# Patient Record
Sex: Male | Born: 1955 | Race: White | Hispanic: No | Marital: Married | Smoking: Never smoker
Health system: Southern US, Community
[De-identification: ages and names within clinical notes are randomized; demographics above are authoritative.]

## PROBLEM LIST (undated history)

## (undated) ENCOUNTER — Ambulatory Visit: Admission: EM | Source: Home / Self Care

## (undated) DIAGNOSIS — E0590 Thyrotoxicosis, unspecified without thyrotoxic crisis or storm: Secondary | ICD-10-CM

## (undated) DIAGNOSIS — J45909 Unspecified asthma, uncomplicated: Secondary | ICD-10-CM

## (undated) DIAGNOSIS — E785 Hyperlipidemia, unspecified: Secondary | ICD-10-CM

## (undated) DIAGNOSIS — C801 Malignant (primary) neoplasm, unspecified: Secondary | ICD-10-CM

## (undated) DIAGNOSIS — I10 Essential (primary) hypertension: Secondary | ICD-10-CM

## (undated) DIAGNOSIS — M1990 Unspecified osteoarthritis, unspecified site: Secondary | ICD-10-CM

## (undated) DIAGNOSIS — J3089 Other allergic rhinitis: Secondary | ICD-10-CM

## (undated) DIAGNOSIS — F329 Major depressive disorder, single episode, unspecified: Secondary | ICD-10-CM

## (undated) DIAGNOSIS — E079 Disorder of thyroid, unspecified: Secondary | ICD-10-CM

## (undated) DIAGNOSIS — T7840XA Allergy, unspecified, initial encounter: Secondary | ICD-10-CM

## (undated) DIAGNOSIS — F32A Depression, unspecified: Secondary | ICD-10-CM

## (undated) HISTORY — DX: Major depressive disorder, single episode, unspecified: F32.9

## (undated) HISTORY — DX: Allergy, unspecified, initial encounter: T78.40XA

## (undated) HISTORY — DX: Hyperlipidemia, unspecified: E78.5

## (undated) HISTORY — DX: Unspecified asthma, uncomplicated: J45.909

## (undated) HISTORY — DX: Essential (primary) hypertension: I10

## (undated) HISTORY — DX: Depression, unspecified: F32.A

## (undated) HISTORY — DX: Other allergic rhinitis: J30.89

## (undated) HISTORY — DX: Disorder of thyroid, unspecified: E07.9

---

## 2003-05-03 ENCOUNTER — Other Ambulatory Visit: Payer: Self-pay

## 2003-06-02 HISTORY — PX: NASAL SINUS SURGERY: SHX719

## 2005-06-09 ENCOUNTER — Emergency Department: Payer: Self-pay | Admitting: Unknown Physician Specialty

## 2005-06-22 ENCOUNTER — Emergency Department: Payer: Self-pay | Admitting: Emergency Medicine

## 2014-04-18 LAB — PSA

## 2014-11-29 ENCOUNTER — Other Ambulatory Visit: Payer: Self-pay

## 2014-11-30 MED ORDER — LISINOPRIL 10 MG PO TABS: 10.0000 mg | ORAL_TABLET | Freq: Every day | ORAL | Status: DC

## 2014-11-30 MED ORDER — AMLODIPINE BESYLATE 5 MG PO TABS: 5.0000 mg | ORAL_TABLET | Freq: Every day | ORAL | Status: DC

## 2014-11-30 MED ORDER — PAROXETINE HCL 20 MG PO TABS: 20.0000 mg | ORAL_TABLET | Freq: Every day | ORAL | Status: DC

## 2014-12-04 ENCOUNTER — Telehealth: Payer: Self-pay

## 2014-12-04 MED ORDER — AMLODIPINE BESYLATE 5 MG PO TABS: ORAL_TABLET | ORAL | Status: DC

## 2014-12-04 NOTE — Telephone Encounter (Signed)
Rx sent to his pharmacy. Needs to be seen for more refills.

## 2014-12-04 NOTE — Telephone Encounter (Signed)
Pharmacy sent a fax over stating that 2 sets of directions were placed on the Amlodipine. [facility] states take 2 tabs daily and [facility] number is [phone]. Can you re write the rx with the correct directions?

## 2015-01-25 ENCOUNTER — Other Ambulatory Visit: Payer: Self-pay | Admitting: Unknown Physician Specialty

## 2015-01-25 NOTE — Telephone Encounter (Signed)
Needs check soon please

## 2015-04-01 ENCOUNTER — Telehealth: Payer: Self-pay | Admitting: Unknown Physician Specialty

## 2015-04-01 NOTE — Telephone Encounter (Signed)
Pt would like to know if he can pick up a steroid inhaler sample until he gets his through mail order(advair is what he normally uses)

## 2015-04-01 NOTE — Telephone Encounter (Signed)
Routing to provider. Patient uses the Advair 100/50 which is what we have in the sample closet. Is it okay for him to have a sample?

## 2015-04-01 NOTE — Telephone Encounter (Signed)
Yes, thanks

## 2015-04-01 NOTE — Telephone Encounter (Signed)
Left patient a voicemail stating that he could come by between 8 and 5 to pick up his sample.

## 2015-04-16 DIAGNOSIS — I10 Essential (primary) hypertension: Secondary | ICD-10-CM | POA: Insufficient documentation

## 2015-04-16 DIAGNOSIS — F325 Major depressive disorder, single episode, in full remission: Secondary | ICD-10-CM | POA: Insufficient documentation

## 2015-04-16 DIAGNOSIS — F3341 Major depressive disorder, recurrent, in partial remission: Secondary | ICD-10-CM

## 2015-04-16 DIAGNOSIS — J45909 Unspecified asthma, uncomplicated: Secondary | ICD-10-CM | POA: Insufficient documentation

## 2015-04-16 DIAGNOSIS — E785 Hyperlipidemia, unspecified: Secondary | ICD-10-CM

## 2015-04-16 DIAGNOSIS — J309 Allergic rhinitis, unspecified: Secondary | ICD-10-CM | POA: Insufficient documentation

## 2015-04-25 ENCOUNTER — Other Ambulatory Visit: Payer: Self-pay | Admitting: Unknown Physician Specialty

## 2015-04-29 ENCOUNTER — Ambulatory Visit (INDEPENDENT_AMBULATORY_CARE_PROVIDER_SITE_OTHER): Payer: BLUE CROSS/BLUE SHIELD | Admitting: Unknown Physician Specialty

## 2015-04-29 ENCOUNTER — Encounter: Payer: Self-pay | Admitting: Unknown Physician Specialty

## 2015-04-29 VITALS — BP 133/85 | HR 63 | Temp 97.6°F | Ht 72.5 in | Wt 188.0 lb

## 2015-04-29 DIAGNOSIS — R5383 Other fatigue: Secondary | ICD-10-CM

## 2015-04-29 DIAGNOSIS — J45909 Unspecified asthma, uncomplicated: Secondary | ICD-10-CM | POA: Diagnosis not present

## 2015-04-29 DIAGNOSIS — I10 Essential (primary) hypertension: Secondary | ICD-10-CM | POA: Diagnosis not present

## 2015-04-29 DIAGNOSIS — Z0000 Encounter for general adult medical examination without abnormal findings: Secondary | ICD-10-CM | POA: Diagnosis not present

## 2015-04-29 DIAGNOSIS — F3341 Major depressive disorder, recurrent, in partial remission: Secondary | ICD-10-CM

## 2015-04-29 MED ORDER — FLUTICASONE-SALMETEROL 100-50 MCG/DOSE IN AEPB: 1.0000 | INHALATION_SPRAY | Freq: Two times a day (BID) | RESPIRATORY_TRACT | Status: DC

## 2015-04-29 MED ORDER — LISINOPRIL 10 MG PO TABS: 10.0000 mg | ORAL_TABLET | Freq: Every day | ORAL | Status: DC

## 2015-04-29 MED ORDER — FLUTICASONE PROPIONATE 50 MCG/ACT NA SUSP: 2.0000 | Freq: Every day | NASAL | Status: DC

## 2015-04-29 MED ORDER — PAROXETINE HCL 20 MG PO TABS: 20.0000 mg | ORAL_TABLET | Freq: Every day | ORAL | Status: DC

## 2015-04-29 MED ORDER — AMLODIPINE BESYLATE 5 MG PO TABS: 10.0000 mg | ORAL_TABLET | Freq: Every day | ORAL | Status: DC

## 2015-04-29 NOTE — Assessment & Plan Note (Signed)
Stable, continue present medications.   

## 2015-04-29 NOTE — Assessment & Plan Note (Signed)
Stable.  Question if fatigue is related to worsening depression

## 2015-04-29 NOTE — Progress Notes (Signed)
++  BP 133/85 mmHg  Pulse 63  Temp(Src) 97.6 F (36.4 C)  Ht 6' 0.5" (1.842 m)  Wt 188 lb (85.276 kg)  BMI 25.13 kg/m2  SpO2 95%   Subjective:    Patient ID: Jonathan Roberson, male    DOB: 07-19-1955, 59 y.o.   MRN: [id number]  HPI: Jonathan Roberson is a 59 y.o. male  Chief Complaint  Patient presents with  . Annual Exam    pt states he wants to talk about getting shingles vaccine and sleeping to much   Sleepyness  Pt states that he is often sleepy.  He wonders if his depression is well controlled.  He states he only snores from time to time but he is not sure.  He does feel rested in the AM.  He does exercise during the day which helps.   Depression screen PHQ 2/9 04/29/2015  Decreased Interest 2  Down, Depressed, Hopeless 1  PHQ - 2 Score 3  Altered sleeping 2  Tired, decreased energy 2  Change in appetite 0  Feeling bad or failure about yourself  0  Trouble concentrating 1  Moving slowly or fidgety/restless 1  Suicidal thoughts 0  PHQ-9 Score 9   Hypertension Using medications without difficulty Average home BPs   No problems or lightheadedness No chest pain with exertion or shortness of breath No Edema  Relevant past medical, surgical, family and social history reviewed and updated as indicated. Interim medical history since our last visit reviewed. Allergies and medications reviewed and updated.  Review of Systems  Constitutional: Negative.   HENT: Negative.   Eyes: Negative.   Respiratory: Negative.   Cardiovascular: Negative.   Gastrointestinal: Negative.   Endocrine: Negative.   Genitourinary: Negative.   Skin: Negative.   Allergic/Immunologic: Negative.   Neurological: Negative.   Hematological: Negative.   Psychiatric/Behavioral: Negative.     Per HPI unless specifically indicated above     Objective:    BP 133/85 mmHg  Pulse 63  Temp(Src) 97.6 F (36.4 C)  Ht 6' 0.5" (1.842 m)  Wt 188 lb (85.276 kg)  BMI 25.13 kg/m2  SpO2 95%  Wt  Readings from Last 3 Encounters:  04/29/15 188 lb (85.276 kg)  04/18/14 192 lb (87.091 kg)    Physical Exam  Constitutional: He is oriented to person, place, and time. He appears well-developed and well-nourished.  HENT:  Head: Normocephalic.  Eyes: Pupils are equal, round, and reactive to light.  Cardiovascular: Normal rate, regular rhythm and normal heart sounds.   Pulmonary/Chest: Effort normal.  Abdominal: Soft. Bowel sounds are normal.  Musculoskeletal: Normal range of motion.  Neurological: He is alert and oriented to person, place, and time. He has normal reflexes.  Skin: Skin is warm and dry.  Psychiatric: He has a normal mood and affect. His behavior is normal. Judgment and thought content normal.    Assessment & Plan:   Problem List Items Addressed This Visit      Unprioritized   Hypertension - Primary    Stable, continue present medications.       Relevant Medications   amLODipine (NORVASC) 5 MG tablet   lisinopril (PRINIVIL,ZESTRIL) 10 MG tablet   Other Relevant Orders   Comprehensive metabolic panel   Lipid Panel w/o Chol/HDL Ratio   Asthma    Stable, continue present medications.       Relevant Medications   Fluticasone-Salmeterol (ADVAIR) 100-50 MCG/DOSE AEPB   Major depression (HCC)    Stable.  Question if  fatigue is related to worsening depression      Relevant Medications   PARoxetine (PAXIL) 20 MG tablet    Other Visit Diagnoses    Annual physical exam        Relevant Orders    CBC    Hepatitis C antibody    HIV antibody    Other fatigue        Relevant Orders    CBC    TSH    VITAMIN D 25 Hydroxy (Vit-D Deficiency, Fractures)        Follow up plan: Return in about 6 months (around 10/27/2015).

## 2015-04-30 LAB — COMPREHENSIVE METABOLIC PANEL
ALBUMIN: 4.5 g/dL (ref 3.5–5.5)
ALT: 22 IU/L (ref 0–44)
AST: 27 IU/L (ref 0–40)
Albumin/Globulin Ratio: 1.6 (ref 1.1–2.5)
Alkaline Phosphatase: 56 IU/L (ref 39–117)
BUN / CREAT RATIO: 14 (ref 9–20)
BUN: 11 mg/dL (ref 6–24)
Bilirubin Total: 1 mg/dL (ref 0.0–1.2)
CALCIUM: 9.1 mg/dL (ref 8.7–10.2)
CO2: 27 mmol/L (ref 18–29)
CREATININE: 0.8 mg/dL (ref 0.76–1.27)
Chloride: 94 mmol/L — ABNORMAL LOW (ref 97–106)
GFR calc non Af Amer: 98 mL/min/{1.73_m2} (ref >59)
GFR, EST AFRICAN AMERICAN: 114 mL/min/{1.73_m2} (ref >59)
GLUCOSE: 81 mg/dL (ref 65–99)
Globulin, Total: 2.8 g/dL (ref 1.5–4.5)
Potassium: 3.7 mmol/L (ref 3.5–5.2)
Sodium: 135 mmol/L — ABNORMAL LOW (ref 136–144)
TOTAL PROTEIN: 7.3 g/dL (ref 6.0–8.5)

## 2015-04-30 LAB — CBC
HEMOGLOBIN: 14.2 g/dL (ref 12.6–17.7)
Hematocrit: 41.4 % (ref 37.5–51.0)
MCH: 30.9 pg (ref 26.6–33.0)
MCHC: 34.3 g/dL (ref 31.5–35.7)
MCV: 90 fL (ref 79–97)
Platelets: 192 10*3/uL (ref 150–379)
RBC: 4.6 x10E6/uL (ref 4.14–5.80)
RDW: 13.7 % (ref 12.3–15.4)
WBC: 4.8 10*3/uL (ref 3.4–10.8)

## 2015-04-30 LAB — LIPID PANEL W/O CHOL/HDL RATIO
CHOLESTEROL TOTAL: 220 mg/dL — AB (ref 100–199)
HDL: 87 mg/dL (ref >39)
LDL Calculated: 120 mg/dL — ABNORMAL HIGH (ref 0–99)
Triglycerides: 66 mg/dL (ref 0–149)
VLDL Cholesterol Cal: 13 mg/dL (ref 5–40)

## 2015-04-30 LAB — HIV ANTIBODY (ROUTINE TESTING W REFLEX): HIV Screen 4th Generation wRfx: NONREACTIVE

## 2015-04-30 LAB — HEPATITIS C ANTIBODY: Hep C Virus Ab: <0.1 s/co ratio (ref 0.0–0.9)

## 2015-04-30 LAB — TSH: TSH: 1.02 u[IU]/mL (ref 0.450–4.500)

## 2015-04-30 LAB — VITAMIN D 25 HYDROXY (VIT D DEFICIENCY, FRACTURES): Vit D, 25-Hydroxy: 29.8 ng/mL — ABNORMAL LOW (ref 30.0–100.0)

## 2015-05-01 ENCOUNTER — Encounter: Payer: Self-pay | Admitting: Unknown Physician Specialty

## 2015-05-01 LAB — SPECIMEN STATUS REPORT

## 2015-05-01 LAB — PSA: PROSTATE SPECIFIC AG, SERUM: 2.1 ng/mL (ref 0.0–4.0)

## 2015-07-12 ENCOUNTER — Telehealth: Payer: Self-pay

## 2015-07-12 ENCOUNTER — Telehealth: Payer: Self-pay | Admitting: Unknown Physician Specialty

## 2015-07-12 MED ORDER — AMLODIPINE BESYLATE 5 MG PO TABS: 10.0000 mg | ORAL_TABLET | Freq: Every day | ORAL | Status: DC

## 2015-07-12 NOTE — Telephone Encounter (Signed)
Called pharmacy and they said they did not get the prescription that was sent on 04/29/15. They said they got one on 04/26/15 for 90 days that was sent out on 04/30/15. They state they need a new prescription.

## 2015-07-12 NOTE — Telephone Encounter (Signed)
Patient called to advise that Vitamin D testing was not covered by his insurance and that he has received a bill from [employer].  Per Kathrine Haddock, FNP added additional diagnosis code E55.9.  Contacted [facility] and claim is being resubmitted with additional diagnosis.

## 2015-07-12 NOTE — Telephone Encounter (Signed)
LMOM for Jonathan Roberson advising that an additional diagnosis code was submitted to [facility] and claim is being resubmitted to the insurance company for [place] 04/29/15.

## 2015-07-12 NOTE — Telephone Encounter (Signed)
Pt called stated [facility] did not receive the RX for his Amlodipine. Can this be resubmitted. Thanks.

## 2015-09-05 DIAGNOSIS — J301 Allergic rhinitis due to pollen: Secondary | ICD-10-CM | POA: Diagnosis not present

## 2015-09-05 DIAGNOSIS — J330 Polyp of nasal cavity: Secondary | ICD-10-CM | POA: Diagnosis not present

## 2015-09-05 DIAGNOSIS — J329 Chronic sinusitis, unspecified: Secondary | ICD-10-CM | POA: Diagnosis not present

## 2015-09-10 ENCOUNTER — Other Ambulatory Visit: Payer: Self-pay | Admitting: Unknown Physician Specialty

## 2015-09-17 ENCOUNTER — Encounter: Payer: Self-pay | Admitting: *Deleted

## 2015-09-23 NOTE — Discharge Instructions (Signed)
[facility] °[facility] °ENDOSCOPIC SINUS SURGERY °[facility] ° °What is Functional Endoscopic Sinus Surgery? ° The Surgery involves making the natural openings of the sinuses larger by removing the bony partitions that separate the sinuses from the nasal cavity.  The natural sinus lining is preserved as much as possible to allow the sinuses to resume normal function after the surgery.  In some patients nasal polyps (excessively swollen lining of the sinuses) may be removed to relieve obstruction of the sinus openings.  The surgery is performed through the nose using lighted scopes, which eliminates the need for incisions on the face.  A septoplasty is a different procedure which is sometimes performed with sinus surgery.  It involves straightening the boy partition that separates the two sides of your nose.  A crooked or deviated septum may need repair if is obstructing the sinuses or nasal airflow.  Turbinate reduction is also often performed during sinus surgery.  The turbinates are bony proturberances from the side walls of the nose which swell and can obstruct the nose in patients with sinus and allergy problems.  Their size can be surgically reduced to help relieve nasal obstruction. ° °What Can Sinus Surgery Do For Me? ° Sinus surgery can reduce the frequency of sinus infections requiring antibiotic treatment.  This can provide improvement in nasal congestion, post-nasal drainage, facial pressure and nasal obstruction.  Surgery will NOT prevent you from ever having an infection again, so it usually only for patients who get infections 4 or more times yearly requiring antibiotics, or for infections that do not clear with antibiotics.  It will not cure nasal allergies, so patients with allergies may still require medication to treat their allergies after surgery. Surgery may improve headaches related to sinusitis, however, some people will continue to  require medication to control sinus headaches related to allergies.  Surgery will do nothing for other forms of headache (migraine, tension or cluster). ° °What Are the Risks of Endoscopic Sinus Surgery? ° Current techniques allow surgery to be performed safely with little risk, however, there are rare complications that patients should be aware of.  Because the sinuses are located around the eyes, there is risk of eye injury, including blindness, though again, this would be quite rare. This is usually a result of bleeding behind the eye during surgery, which puts the vision oat risk, though there are treatments to protect the vision and prevent permanent disrupted by surgery causing a leak of the spinal fluid that surrounds the brain.  More serious complications would include bleeding inside the brain cavity or damage to the brain.  Again, all of these complications are uncommon, and spinal fluid leaks can be safely managed surgically if they occur.  The most common complication of sinus surgery is bleeding from the nose, which may require packing or cauterization of the nose.  Continued sinus have polyps may experience recurrence of the polyps requiring revision surgery.  Alterations of sense of smell or injury to the tear ducts are also rare complications.  ° °What is the Surgery Like, and what is the Recovery? ° The Surgery usually takes a couple of hours to perform, and is usually performed under a general anesthetic (completely asleep).  Patients are usually discharged home after a couple of hours.  Sometimes during surgery it is necessary to pack the nose to control bleeding, and the packing is left in place for 24 - 48 hours, and removed by your surgeon.    If a septoplasty was performed during the procedure, there is often a splint placed which must be removed after 5-7 days.   °Discomfort: Pain is usually mild to moderate, and can be controlled by prescription pain medication or acetaminophen (Tylenol).   Aspirin, Ibuprofen (Advil, Motrin), or Naprosyn (Aleve) should be avoided, as they can cause increased bleeding.  Most patients feel sinus pressure like they have a bad head cold for several days.  Sleeping with your head elevated can help reduce swelling and facial pressure, as can ice packs over the face.  A humidifier may be helpful to keep the mucous and blood from drying in the nose.  ° °Diet: There are no specific diet restrictions, however, you should generally start with clear liquids and a light diet of bland foods because the anesthetic can cause some nausea.  Advance your diet depending on how your stomach feels.  Taking your pain medication with food will often help reduce stomach upset which pain medications can cause. ° °Nasal Saline Irrigation: It is important to remove blood clots and dried mucous from the nose as it is healing.  This is done by having you irrigate the nose at least 3 - 4 times daily with a salt water solution.  We recommend using NeilMed Sinus Rinse (available at the drug store).  Fill the squeeze bottle with the solution, bend over a sink, and insert the tip of the squeeze bottle into the nose ½ of an inch.  Point the tip of the squeeze bottle towards the inside corner of the eye on the same side your irrigating.  Squeeze the bottle and gently irrigate the nose.  If you bend forward as you do this, most of the fluid will flow back out of the nose, instead of down your throat.   The solution should be warm, near body temperature, when you irrigate.   Each time you irrigate, you should use a full squeeze bottle.  ° °Note that if you are instructed to use Nasal Steroid Sprays at any time after your surgery, irrigate with saline BEFORE using the steroid spray, so you do not wash it all out of the nose. °Another product, Nasal Saline Gel (such as AYR Nasal Saline Gel) can be applied in each nostril 3 - 4 times daily to moisture the nose and reduce scabbing or crusting. ° °Bleeding:   Bloody drainage from the nose can be expected for several days, and patients are instructed to irrigate their nose frequently with salt water to help remove mucous and blood clots.  The drainage may be dark red or brown, though some fresh blood may be seen intermittently, especially after irrigation.  Do not blow you nose, as bleeding may occur. If you must sneeze, keep your mouth open to allow air to escape through your mouth. ° °If heavy bleeding occurs: Irrigate the nose with saline to rinse out clots, then spray the nose 3 - 4 times with Afrin Nasal Decongestant Spray.  The spray will constrict the blood vessels to slow bleeding.  Pinch the lower half of your nose shut to apply pressure, and lay down with your head elevated.  Ice packs over the nose may help as well. If bleeding persists despite these measures, you should notify your doctor.  Do not use the Afrin routinely to control nasal congestion after surgery, as it can result in worsening congestion and may affect healing.  ° ° ° °Activity: Return to work varies among patients. Most patients will be   out of work at least 5 - 7 days to recover.  Patient may return to work after they are off of narcotic pain medication, and feeling well enough to perform the functions of their job.  Patients must avoid heavy lifting (over 10 pounds) or strenuous physical for 2 weeks after surgery, so your employer may need to assign you to light duty, or keep you out of work longer if light duty is not possible.  NOTE: you should not drive, operate dangerous machinery, do any mentally demanding tasks or make any important legal or financial decisions while on narcotic pain medication and recovering from the general anesthetic.  °  °Call Your Doctor Immediately if You Have Any of the Following: °1. Bleeding that you cannot control with the above measures °2. Loss of vision, double vision, bulging of the eye or black eyes. °3. Fever over 101 degrees °4. Neck stiffness with  severe headache, fever, nausea and change in mental state. °You are always encourage to call anytime with concerns, however, please call with requests for pain medication refills during office hours. ° °Office Endoscopy: During follow-up visits your doctor will remove any packing or splints that may have been placed and evaluate and clean your sinuses endoscopically.  Topical anesthetic will be used to make this as comfortable as possible, though you may want to take your pain medication prior to the visit.  How often this will need to be done varies from patient to patient.  After complete recovery from the surgery, you may need follow-up endoscopy from time to time, particularly if there is concern of recurrent infection or nasal polyps. ° °General Anesthesia, Adult, Care After °Refer to this sheet in the next few weeks. These instructions provide you with information on caring for yourself after your procedure. Your health care provider may also give you more specific instructions. Your treatment has been planned according to current medical practices, but problems sometimes occur. Call your health care provider if you have any problems or questions after your procedure. °WHAT TO EXPECT AFTER THE PROCEDURE °After the procedure, it is typical to experience: °· Sleepiness. °· Nausea and vomiting. °HOME CARE INSTRUCTIONS °· For the first 24 hours after general anesthesia: °¨ Have a responsible person with you. °¨ Do not drive a car. If you are alone, do not take public transportation. °¨ Do not drink alcohol. °¨ Do not take medicine that has not been prescribed by your health care provider. °¨ Do not sign important papers or make important decisions. °¨ You may resume a normal diet and activities as directed by your health care provider. °· Change bandages (dressings) as directed. °· If you have questions or problems that seem related to general anesthesia, call the hospital and ask for the anesthetist or  anesthesiologist on call. °SEEK MEDICAL CARE IF: °· You have nausea and vomiting that continue the day after anesthesia. °· You develop a rash. °SEEK IMMEDIATE MEDICAL CARE IF:  °· You have difficulty breathing. °· You have chest pain. °· You have any allergic problems. °  °This information is not intended to replace advice given to you by your health care provider. Make sure you discuss any questions you have with your health care provider. °  °Document Released: 08/24/2000 Document Revised: 06/08/2014 Document Reviewed: 09/16/2011 °Elsevier Interactive Patient Education ©2016 [employer] Inc[employer] ° °

## 2015-09-24 ENCOUNTER — Encounter
Admission: RE | Disposition: A | Discharge status: Home / Self Care | Payer: Self-pay | Source: Ambulatory Visit | Attending: Otolaryngology

## 2015-09-24 ENCOUNTER — Ambulatory Visit
Admission: RE | Admit: 2015-09-24 | Discharge: 2015-09-24 | Disposition: A | Discharge status: Home / Self Care | Payer: BLUE CROSS/BLUE SHIELD | Source: Ambulatory Visit | Attending: Otolaryngology | Admitting: Otolaryngology

## 2015-09-24 ENCOUNTER — Ambulatory Visit: Payer: BLUE CROSS/BLUE SHIELD | Admitting: Anesthesiology

## 2015-09-24 DIAGNOSIS — Z7982 Long term (current) use of aspirin: Secondary | ICD-10-CM | POA: Diagnosis not present

## 2015-09-24 DIAGNOSIS — J018 Other acute sinusitis: Secondary | ICD-10-CM | POA: Diagnosis not present

## 2015-09-24 DIAGNOSIS — Z881 Allergy status to other antibiotic agents status: Secondary | ICD-10-CM | POA: Insufficient documentation

## 2015-09-24 DIAGNOSIS — J321 Chronic frontal sinusitis: Secondary | ICD-10-CM | POA: Insufficient documentation

## 2015-09-24 DIAGNOSIS — Z825 Family history of asthma and other chronic lower respiratory diseases: Secondary | ICD-10-CM | POA: Diagnosis not present

## 2015-09-24 DIAGNOSIS — J320 Chronic maxillary sinusitis: Secondary | ICD-10-CM | POA: Insufficient documentation

## 2015-09-24 DIAGNOSIS — F329 Major depressive disorder, single episode, unspecified: Secondary | ICD-10-CM | POA: Insufficient documentation

## 2015-09-24 DIAGNOSIS — J322 Chronic ethmoidal sinusitis: Secondary | ICD-10-CM | POA: Diagnosis not present

## 2015-09-24 DIAGNOSIS — Z7951 Long term (current) use of inhaled steroids: Secondary | ICD-10-CM | POA: Diagnosis not present

## 2015-09-24 DIAGNOSIS — I10 Essential (primary) hypertension: Secondary | ICD-10-CM | POA: Diagnosis not present

## 2015-09-24 DIAGNOSIS — J329 Chronic sinusitis, unspecified: Secondary | ICD-10-CM | POA: Diagnosis not present

## 2015-09-24 DIAGNOSIS — J339 Nasal polyp, unspecified: Secondary | ICD-10-CM | POA: Diagnosis not present

## 2015-09-24 DIAGNOSIS — J45909 Unspecified asthma, uncomplicated: Secondary | ICD-10-CM | POA: Insufficient documentation

## 2015-09-24 DIAGNOSIS — Z880 Allergy status to penicillin: Secondary | ICD-10-CM | POA: Insufficient documentation

## 2015-09-24 DIAGNOSIS — J330 Polyp of nasal cavity: Secondary | ICD-10-CM | POA: Diagnosis not present

## 2015-09-24 DIAGNOSIS — J323 Chronic sphenoidal sinusitis: Secondary | ICD-10-CM | POA: Insufficient documentation

## 2015-09-24 DIAGNOSIS — J328 Other chronic sinusitis: Secondary | ICD-10-CM | POA: Diagnosis not present

## 2015-09-24 DIAGNOSIS — J324 Chronic pansinusitis: Secondary | ICD-10-CM | POA: Diagnosis not present

## 2015-09-24 DIAGNOSIS — Z79899 Other long term (current) drug therapy: Secondary | ICD-10-CM | POA: Insufficient documentation

## 2015-09-24 HISTORY — DX: Unspecified osteoarthritis, unspecified site: M19.90

## 2015-09-24 HISTORY — PX: IMAGE GUIDED SINUS SURGERY: SHX6570

## 2015-09-24 SURGERY — SINUS SURGERY, WITH IMAGING GUIDANCE
Anesthesia: General | Site: Nose | Laterality: Left | Wound class: Clean Contaminated

## 2015-09-24 MED ORDER — ONDANSETRON HCL 4 MG/2ML IJ SOLN
INTRAMUSCULAR | Status: DC | PRN
  Administered 2015-09-24: 4 mg via INTRAVENOUS

## 2015-09-24 MED ORDER — ACETAMINOPHEN 10 MG/ML IV SOLN
1000.0000 mg | Freq: Once | INTRAVENOUS | Status: AC
  Administered 2015-09-24: 1000 mg via INTRAVENOUS

## 2015-09-24 MED ORDER — PROPOFOL 10 MG/ML IV BOLUS
INTRAVENOUS | Status: DC | PRN
  Administered 2015-09-24: 50 mg via INTRAVENOUS
  Administered 2015-09-24: 150 mg via INTRAVENOUS

## 2015-09-24 MED ORDER — LACTATED RINGERS IV SOLN
INTRAVENOUS | Status: DC
  Administered 2015-09-24: 10:00:00 via INTRAVENOUS

## 2015-09-24 MED ORDER — SUCCINYLCHOLINE CHLORIDE 20 MG/ML IJ SOLN
INTRAMUSCULAR | Status: DC | PRN
  Administered 2015-09-24: 100 mg via INTRAVENOUS

## 2015-09-24 MED ORDER — PREDNISONE 10 MG (21) PO TBPK: ORAL_TABLET | ORAL | Status: DC

## 2015-09-24 MED ORDER — ROCURONIUM BROMIDE 100 MG/10ML IV SOLN
INTRAVENOUS | Status: DC | PRN
  Administered 2015-09-24: 30 mg via INTRAVENOUS

## 2015-09-24 MED ORDER — LIDOCAINE-EPINEPHRINE 1 %-1:100000 IJ SOLN
INTRAMUSCULAR | Status: DC | PRN
  Administered 2015-09-24: 13 mL

## 2015-09-24 MED ORDER — OXYCODONE HCL 5 MG PO TABS: 5.0000 mg | ORAL_TABLET | Freq: Once | ORAL | Status: DC | PRN

## 2015-09-24 MED ORDER — DOXYCYCLINE HYCLATE 100 MG PO CAPS: 100.0000 mg | ORAL_CAPSULE | Freq: Two times a day (BID) | ORAL | Status: DC

## 2015-09-24 MED ORDER — MIDAZOLAM HCL 5 MG/5ML IJ SOLN
INTRAMUSCULAR | Status: DC | PRN
  Administered 2015-09-24: 2 mg via INTRAVENOUS

## 2015-09-24 MED ORDER — LIDOCAINE HCL (CARDIAC) 20 MG/ML IV SOLN
INTRAVENOUS | Status: DC | PRN
  Administered 2015-09-24: 50 mg via INTRAVENOUS

## 2015-09-24 MED ORDER — OXYMETAZOLINE HCL 0.05 % NA SOLN
NASAL | Status: DC | PRN
  Administered 2015-09-24: 1 via TOPICAL

## 2015-09-24 MED ORDER — HYDROCODONE-ACETAMINOPHEN 5-325 MG PO TABS: 1.0000 | ORAL_TABLET | ORAL | Status: DC | PRN

## 2015-09-24 MED ORDER — FENTANYL CITRATE (PF) 100 MCG/2ML IJ SOLN
INTRAMUSCULAR | Status: DC | PRN
  Administered 2015-09-24: 50 ug via INTRAVENOUS
  Administered 2015-09-24: 100 ug via INTRAVENOUS
  Administered 2015-09-24: 50 ug via INTRAVENOUS

## 2015-09-24 MED ORDER — OXYCODONE HCL 5 MG/5ML PO SOLN: 5.0000 mg | Freq: Once | ORAL | Status: DC | PRN

## 2015-09-24 MED ORDER — DEXAMETHASONE SODIUM PHOSPHATE 4 MG/ML IJ SOLN
INTRAMUSCULAR | Status: DC | PRN
  Administered 2015-09-24: 12 mg via INTRAVENOUS

## 2015-09-24 SURGICAL SUPPLY — 26 items
BALLOON SINUPLASTY SYSTEM (BALLOONS) IMPLANT
BATTERY INSTRU NAVIGATION (MISCELLANEOUS) ×16 IMPLANT
BLADE IRRIGATOR 40D CVD (IRRIGATION / IRRIGATOR) IMPLANT
CANISTER SUCT 1200ML W/VALVE (MISCELLANEOUS) ×4 IMPLANT
COAG SUCT 10F 3.5MM HAND CTRL (MISCELLANEOUS) IMPLANT
DEVICE INFLATION SEID (MISCELLANEOUS) IMPLANT
DRAPE HEAD BAR (DRAPES) ×4 IMPLANT
DRESSING NASL FOAM PST OP SINU (MISCELLANEOUS) IMPLANT
DRSG NASAL 4CM NASOPORE (MISCELLANEOUS) IMPLANT
DRSG NASAL FOAM POST OP SINU (MISCELLANEOUS)
GLOVE BIO SURGEON STRL SZ7.5 (GLOVE) ×8 IMPLANT
IRRIGATOR 4MM STR (IRRIGATION / IRRIGATOR) IMPLANT
IV NS 500ML (IV SOLUTION) ×1
IV NS 500ML BAXH (IV SOLUTION) ×3 IMPLANT
KIT ROOM TURNOVER OR (KITS) ×4 IMPLANT
NAVIGATION MASK REG  ST (MISCELLANEOUS) ×4 IMPLANT
NS IRRIG 500ML POUR BTL (IV SOLUTION) ×4 IMPLANT
PACK DRAPE NASAL/ENT (PACKS) ×4 IMPLANT
PACKING NASAL EPIS 4X2.4 XEROG (MISCELLANEOUS) IMPLANT
PAD GROUND ADULT SPLIT (MISCELLANEOUS) ×4 IMPLANT
PATTIES SURGICAL .5 X3 (DISPOSABLE) ×4 IMPLANT
SET HANDPIECE IRR DIEGO (MISCELLANEOUS) IMPLANT
SOL ANTI-FOG 6CC FOG-OUT (MISCELLANEOUS) ×3 IMPLANT
SOL FOG-OUT ANTI-FOG 6CC (MISCELLANEOUS) ×1
SYRINGE 10CC LL (SYRINGE) ×8 IMPLANT
WATER STERILE IRR 500ML POUR (IV SOLUTION) IMPLANT

## 2015-09-24 NOTE — Anesthesia Procedure Notes (Signed)
Procedure Name: Intubation Date/Time: 09/24/2015 11:47 AM Performed by: Londell Moh Pre-anesthesia Checklist: Patient identified, Emergency Drugs available, Suction available, Patient being monitored and Timeout performed Patient Re-evaluated:Patient Re-evaluated prior to inductionOxygen Delivery Method: Circle system utilized Preoxygenation: Pre-oxygenation with 100% oxygen Intubation Type: IV induction Ventilation: Mask ventilation without difficulty Laryngoscope Size: Mac and 4 Grade View: Grade I Tube type: Oral Rae Tube size: 8.0 mm Number of attempts: 1 Placement Confirmation: ETT inserted through vocal cords under direct vision,  positive ETCO2 and breath sounds checked- equal and bilateral Tube secured with: Tape Dental Injury: Teeth and Oropharynx as per pre-operative assessment

## 2015-09-24 NOTE — H&P (Signed)
History and physical reviewed and will be scanned in later. No change in medical status reported by the patient or family, appears stable for surgery. All questions regarding the procedure answered, and patient (or family if a child) expressed understanding of the procedure.  Jonathan Roberson @TODAY@ 

## 2015-09-24 NOTE — Anesthesia Postprocedure Evaluation (Signed)
Anesthesia Post Note  Patient: Jonathan Roberson  Procedure(s) Performed: Procedure(s) (LRB): IMAGE GUIDED SINUS SURGERY Bilateral nasal polypectomy,maxillary antrostomy with tissue removal,bilateral ethmoidectomy,frontal sinusotomy (Bilateral)  Patient location during evaluation: PACU Anesthesia Type: General Level of consciousness: awake and alert Pain management: pain level controlled Vital Signs Assessment: post-procedure vital signs reviewed and stable Respiratory status: spontaneous breathing, nonlabored ventilation, respiratory function stable and patient connected to nasal cannula oxygen Cardiovascular status: blood pressure returned to baseline and stable Postop Assessment: no signs of nausea or vomiting Anesthetic complications: no    Teirra Carapia

## 2015-09-24 NOTE — Op Note (Signed)
09/24/2015  1:55 PM    [name]  [id number]   Pre-Op Diagnosis:  NASAL POLYPS,  CHRONIC PANSINUSITIS Post-op Diagnosis: Nasal Polyps Chronic Sinusitis  Procedure:  1)  Image Guided Sinus Surgery,   2)  Left Endoscopic Maxillary Antrostomy with Tissue Removal   3)  Bilateral Frontal Sinusotomy   4)  Bilateral total ethmoidectomy   5)  Bilateral Sphenoidotomy    Surgeon:  Riley Nearing  Anesthesia:  General endotracheal  EBL:  [id number] cc  Complications:  None  Findings: Extensive polyposis involving the ethmoid sinuses frontal recess and sphenoid recess bilaterally, with larger polyps occluding the left maxillary region. Previous maxillary antrostomies were noted although the antrostomy on the left was occluded partially by polypoid tissue. Similarly there appeared to be small sphenoid antrostomies which were again occluded with polypoid tissue  Procedure: After the patient was identified in holding and the benefits of the procedure were reviewed as well as the consent and risks, the patient was taken to the operating room and with the patient in a comfortable supine position,  general orotracheal anesthesia was induced without difficulty.  A proper time-out was performed.  The [employer] image guidance system was set up and calibrated in the normal fashion and felt to be acceptable.  Next 1% Xylocaine with 1:100,000 epinephrine was infiltrated into  anterior middle turbinates bilaterally.  Several minutes were allowed for this to take effect.  Cottoniod pledgets soaked in Afrin were placed into both nasal cavities and left while the patient was prepped and draped in the standard fashion. The image guided suction was calibrated and used to inspect known points in the nasal cavity to assess accuracy of the image guided system. Accuracy was felt to be excellent.   The left nasal cavity was inspected endoscopically. Large polyps were noted to be coming from the middle meatus region. These  were debrided with the microdebrider. The straight image guided suction was used to evaluate the anatomy particularly around the middle meatus. A good portion of the middle turbinate had been previously removed. Polyps were debrided from the region of the maxillary sinus where there was an apparent prior maxillary antrostomy that was occluded partially with polyp regrowth. Polyps were debrided from the margins of the antrostomy and, removing soft tissue until the antrostomy was widely patent. The sinus was aspirated. Reassessing the anatomy with the image guided suction, polyps were then debrided from the middle meatus with the microdebrider, as well as using through-cutting forceps. Total revision of the ethmoid cavities was performed in this fashion, opening some ethmoid sinuses that did not appear to been opened previously as well as debriding polyps from the ethmoids to achieve patency. Frequent use of the image guided suction helped reaffirm anatomy to avoid injury to the lamina papyracea and skull base. Polyps were debrided from the sphenoid recess with the microdebrider. The sphenoid os was identified and appeared to be occluded with polyps. These were debrided to establish patency of the sphenoid os. Once again the image guided suction was used to reaffirm the anatomy during this portion of the procedure. Next a 30 scope was used to provide better visualization of the frontal recess, which was dissected with through-cutting forceps. An agger nasi cell was noted and opened to improve access to the frontal recess. The curved image guided suction was used to frequently reassess anatomy and later a 70 scope was utilized for better visualization of the frontal recess which was carefully dissected until the frontal duct was  visualized and patency achieved. The frontal duct on this side was narrowing but patent at the completion the procedure.  Attention was then turned to the right nasal cavity. The same  procedures as described above were performed with the exception of the maxillary sinus which remained patent with a widely patent maxillary antrostomy from prior surgery. A total ethmoidectomy, sphenoidotomy and frontal recess exploration were performed as described above with frequent reassessment of the anatomy utilizing the image guided suction. Extensive polyposis was noted in the sphenoid recess, ethmoid sinuses and frontal recess. Once again a good portion of the middle turbinate had already been resected during prior surgery. Next the left anterior ethmoid sinuses were dissected beginning inferomedially, entering the ethmoid bulla. Thru cut forceps were used to open the anterior ethmoids. The microdebrider was used as needed to trim loose mucosal edges.  The nose was suctioned and inspected. The maxillary sinuses were inspected with the 70 scope and found to be free of any residual disease. Cautery was used to control some bleeding bilaterally, and Stammberger absorbable sinus packing was then placed in the ethmoid cavities bilaterally.   The patient was then returned to the anesthesiologist for awakening and taken to recovery room in good condition postoperatively.  Disposition:   PACU and d/c home  Plan: Ice, elevation, narcotic analgesia and prophylactic antibiotics. Begin sinus irrigations with saline tomrrow, irrigating 3-4 times daily. Return to the office in 7 days.  Return to work in 7-10 days, no strenuous activities for two weeks.   Riley Nearing 09/24/2015 1:55 PM

## 2015-09-24 NOTE — Anesthesia Preprocedure Evaluation (Signed)
Anesthesia Evaluation  Patient identified by MRN, date of birth, ID band  Reviewed: NPO status   History of Anesthesia Complications (+) history of anesthetic complications  Airway Mallampati: II  TM Distance: >3 FB Neck ROM: full    Dental no notable dental hx.    Pulmonary asthma (mild) ,    Pulmonary exam normal        Cardiovascular Exercise Tolerance: Good hypertension, Normal cardiovascular exam     Neuro/Psych PSYCHIATRIC DISORDERS Depression negative neurological ROS  negative psych ROS   GI/Hepatic negative GI ROS, Neg liver ROS,   Endo/Other  negative endocrine ROS  Renal/GU negative Renal ROS  negative genitourinary   Musculoskeletal  (+) Arthritis ,   Abdominal   Peds  Hematology negative hematology ROS (+)   Anesthesia Other Findings   Reproductive/Obstetrics                             Anesthesia Physical Anesthesia Plan  ASA: II  Anesthesia Plan: General ETT   Post-op Pain Management:    Induction:   Airway Management Planned:   Additional Equipment:   Intra-op Plan:   Post-operative Plan:   Informed Consent: I have reviewed the patients History and Physical, chart, labs and discussed the procedure including the risks, benefits and alternatives for the proposed anesthesia with the patient or authorized representative who has indicated his/her understanding and acceptance.     Plan Discussed with: CRNA  Anesthesia Plan Comments:         Anesthesia Quick Evaluation

## 2015-09-24 NOTE — Transfer of Care (Signed)
Immediate Anesthesia Transfer of Care Note  Patient: Jonathan Roberson  Procedure(s) Performed: Procedure(s) with comments: IMAGE GUIDED SINUS SURGERY (N/A) - GAVE DISK TO CE CE 4-12 KP POLYPECTOMY NASAL (Bilateral) MAXILLARY ANTROSTOMY WITH TISSUE REMOVAL (Left) ETHMOIDECTOMY (Bilateral) FRONTAL SINUS EXPLORATION (Bilateral) SPHENOIDECTOMY (Bilateral)  Patient Location: PACU  Anesthesia Type: General ETT  Level of Consciousness: awake, alert  and patient cooperative  Airway and Oxygen Therapy: Patient Spontanous Breathing and Patient connected to supplemental oxygen  Post-op Assessment: Post-op Vital signs reviewed, Patient's Cardiovascular Status Stable, Respiratory Function Stable, Patent Airway and No signs of Nausea or vomiting  Post-op Vital Signs: Reviewed and stable  Complications: No apparent anesthesia complications

## 2015-09-26 LAB — SURGICAL PATHOLOGY

## 2015-10-02 DIAGNOSIS — J330 Polyp of nasal cavity: Secondary | ICD-10-CM | POA: Diagnosis not present

## 2015-10-02 DIAGNOSIS — R0981 Nasal congestion: Secondary | ICD-10-CM | POA: Diagnosis not present

## 2015-10-02 DIAGNOSIS — J310 Chronic rhinitis: Secondary | ICD-10-CM | POA: Diagnosis not present

## 2015-10-04 DIAGNOSIS — J301 Allergic rhinitis due to pollen: Secondary | ICD-10-CM | POA: Diagnosis not present

## 2015-10-07 ENCOUNTER — Ambulatory Visit: Payer: BLUE CROSS/BLUE SHIELD | Admitting: Unknown Physician Specialty

## 2015-10-14 ENCOUNTER — Encounter: Payer: Self-pay | Admitting: Otolaryngology

## 2015-10-17 DIAGNOSIS — J301 Allergic rhinitis due to pollen: Secondary | ICD-10-CM | POA: Diagnosis not present

## 2015-10-24 DIAGNOSIS — J301 Allergic rhinitis due to pollen: Secondary | ICD-10-CM | POA: Diagnosis not present

## 2015-10-29 ENCOUNTER — Encounter: Payer: Self-pay | Admitting: Unknown Physician Specialty

## 2015-10-29 ENCOUNTER — Ambulatory Visit (INDEPENDENT_AMBULATORY_CARE_PROVIDER_SITE_OTHER): Payer: BLUE CROSS/BLUE SHIELD | Admitting: Unknown Physician Specialty

## 2015-10-29 VITALS — BP 125/87 | HR 71 | Temp 98.0°F | Ht 73.3 in | Wt 193.4 lb

## 2015-10-29 DIAGNOSIS — I10 Essential (primary) hypertension: Secondary | ICD-10-CM | POA: Diagnosis not present

## 2015-10-29 DIAGNOSIS — F3341 Major depressive disorder, recurrent, in partial remission: Secondary | ICD-10-CM

## 2015-10-29 DIAGNOSIS — J45901 Unspecified asthma with (acute) exacerbation: Secondary | ICD-10-CM | POA: Diagnosis not present

## 2015-10-29 MED ORDER — LISINOPRIL 10 MG PO TABS: 10.0000 mg | ORAL_TABLET | Freq: Every day | ORAL | Status: DC

## 2015-10-29 MED ORDER — FLUTICASONE-SALMETEROL 500-50 MCG/DOSE IN AEPB: 1.0000 | INHALATION_SPRAY | Freq: Two times a day (BID) | RESPIRATORY_TRACT | Status: DC

## 2015-10-29 MED ORDER — DOXYCYCLINE HYCLATE 100 MG PO TABS: 100.0000 mg | ORAL_TABLET | Freq: Two times a day (BID) | ORAL | Status: DC

## 2015-10-29 MED ORDER — ALBUTEROL SULFATE HFA 108 (90 BASE) MCG/ACT IN AERS: 2.0000 | INHALATION_SPRAY | Freq: Four times a day (QID) | RESPIRATORY_TRACT | Status: DC | PRN

## 2015-10-29 MED ORDER — PAROXETINE HCL 20 MG PO TABS: 20.0000 mg | ORAL_TABLET | Freq: Every day | ORAL | Status: DC

## 2015-10-29 NOTE — Assessment & Plan Note (Signed)
Stable, continue present medications.   

## 2015-10-29 NOTE — Progress Notes (Signed)
BP 125/87 mmHg  Pulse 71  Temp(Src) 98 F (36.7 C)  Ht 6' 1.3" (1.862 m)  Wt 193 lb 6.4 oz (87.726 kg)  BMI 25.30 kg/m2  SpO2 92%   Subjective:    Patient ID: Jonathan Roberson, male    DOB: 12/01/1955, 60 y.o.   MRN: [id number]  HPI: Jonathan Roberson is a 60 y.o. male  Chief Complaint  Patient presents with  . Hypertension  . Depression   Depression This is stable Depression screen [facility] 2/9 10/29/2015 04/29/2015  Decreased Interest 0 2  Down, Depressed, Hopeless 0 1  PHQ - 2 Score 0 3  Altered sleeping - 2  Tired, decreased energy - 2  Change in appetite - 0  Feeling bad or failure about yourself  - 0  Trouble concentrating - 1  Moving slowly or fidgety/restless - 1  Suicidal thoughts - 0  PHQ-9 Score - 9   Hypertension Using medications without difficulty Average home BPs 120s/mid 80's  No problems or lightheadedness No chest pain with exertion or shortness of breath No Edema    URI  This is a new (States he got a cold and settled in his lungs as is typical for him) problem. Episode onset: 4 days. The problem has been gradually improving. There has been no fever. Associated symptoms include congestion, coughing and rhinorrhea. Pertinent negatives include no abdominal pain. He has tried nothing for the symptoms. The treatment provided no relief.     Relevant past medical, surgical, family and social history reviewed and updated as indicated. Interim medical history since our last visit reviewed. Allergies and medications reviewed and updated.  Review of Systems  HENT: Positive for congestion and rhinorrhea.   Respiratory: Positive for cough.   Gastrointestinal: Negative for abdominal pain.    Per HPI unless specifically indicated above     Objective:    BP 125/87 mmHg  Pulse 71  Temp(Src) 98 F (36.7 C)  Ht 6' 1.3" (1.862 m)  Wt 193 lb 6.4 oz (87.726 kg)  BMI 25.30 kg/m2  SpO2 92%  Wt Readings from Last 3 Encounters:  10/29/15 193 lb 6.4 oz (87.726  kg)  09/24/15 194 lb (87.998 kg)  04/29/15 188 lb (85.276 kg)    Physical Exam  Constitutional: He is oriented to person, place, and time. He appears well-developed and well-nourished. No distress.  HENT:  Head: Normocephalic and atraumatic.  Right Ear: Tympanic membrane and ear canal normal.  Left Ear: Tympanic membrane and ear canal normal.  Nose: Rhinorrhea present. Right sinus exhibits no maxillary sinus tenderness and no frontal sinus tenderness. Left sinus exhibits no maxillary sinus tenderness and no frontal sinus tenderness.  Mouth/Throat: Uvula is midline. Posterior oropharyngeal edema present.  Eyes: Conjunctivae and lids are normal. Right eye exhibits no discharge. Left eye exhibits no discharge. No scleral icterus.  Neck: Neck supple.  Cardiovascular: Normal rate, regular rhythm and normal heart sounds.   Pulmonary/Chest: Effort normal. No respiratory distress. He has wheezes. He has rhonchi.  Abdominal: Normal appearance. There is no splenomegaly or hepatomegaly.  Musculoskeletal: Normal range of motion.  Neurological: He is alert and oriented to person, place, and time.  Skin: Skin is warm, dry and intact. No rash noted. No pallor.  Psychiatric: He has a normal mood and affect. His behavior is normal. Judgment and thought content normal.  Nursing note and vitals reviewed.   Results for orders placed or performed during the hospital encounter of 09/24/15  Surgical pathology  Result Value Ref Range   SURGICAL PATHOLOGY      Surgical Pathology CASE: [id number] PATIENT: [facility] Surgical Pathology Report     SPECIMEN SUBMITTED: A. Sinus contents, right B. Sinus contents, left  CLINICAL HISTORY: None provided  PRE-OPERATIVE DIAGNOSIS: Nasal polyps chronic sinusitis  POST-OPERATIVE DIAGNOSIS: Same as pre-op     DIAGNOSIS: A. SINUS CONTENTS, RIGHT; IMAGE GUIDED SINUS SURGERY: - PURULENT MATERIAL (BACTERIAL FORMS, ACUTE INFLAMMATION AND FIBRIN. 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 - NASAL POLYP, INFLAMMATORY TYPE.  B. SINUS CONTENTS, LEFT; IMAGE GUIDED SINUS SURGERY: - INFLAMMATORY TYPE NASAL POLYPS. - CHRONIC SINUSITIS. - NEGATIVE FOR INCREASED EOSINOPHILS.   GROSS DESCRIPTION:  A. Label: right sinus contents  Size: aggregate, 2.8 x 2.2 x 0.3 cm  Description:  aggregate of pink-tan soft tissue, admixed with cartilage and mucoid material  Block summary: 1- representative sections  B. Label: left sinus contents  Size: aggregate, 3.2 x  2.5 x 0.3 cm  Description:  aggregate of pink-tan soft tissue, admixed with cartilage and mucoid material  Block summary: 1- representative sections       Final Diagnosis performed by Delorse Lek, MD.  Electronically signed 09/26/2015 11:49:56AM    The electronic signature indicates that the named Attending Pathologist has evaluated the specimen  Technical component performed at [facility], [address], [place], [place] [address] Lab: [phone] Dir: Darrick Penna. Evette Doffing, MD  Professional component performed at [facility], [facility], [place], [place], [place] [address] Lab: [phone] Dir: Dellia Nims. Reuel Derby, MD        Assessment & Plan:   Problem List Items Addressed This Visit      Unprioritized   Hypertension    Stable, continue present medications.        Relevant Medications   lisinopril (PRINIVIL,ZESTRIL) 10 MG tablet   Major depression (HCC)    Stable, continue present medications.        Relevant Medications   PARoxetine (PAXIL) 20 MG tablet    Other Visit Diagnoses    Asthma exacerbation    -  Primary    Refusing prednisone as just got off them with sinus surgery.  Will rx Doxycycline and increase inhaler use.  Use higher dose of Advair for 1 months    Relevant Medications    albuterol (PROVENTIL HFA;VENTOLIN HFA) 108 (90 Base) MCG/ACT inhaler    Fluticasone-Salmeterol (ADVAIR DISKUS) 500-50 MCG/DOSE  AEPB        Follow up plan: Return in about 6 months (around 04/30/2016).

## 2015-11-01 DIAGNOSIS — J301 Allergic rhinitis due to pollen: Secondary | ICD-10-CM | POA: Diagnosis not present

## 2015-11-12 DIAGNOSIS — J301 Allergic rhinitis due to pollen: Secondary | ICD-10-CM | POA: Diagnosis not present

## 2015-11-18 ENCOUNTER — Other Ambulatory Visit: Payer: Self-pay

## 2015-11-18 MED ORDER — FLUTICASONE-SALMETEROL 100-50 MCG/DOSE IN AEPB: 1.0000 | INHALATION_SPRAY | Freq: Two times a day (BID) | RESPIRATORY_TRACT | Status: DC

## 2015-11-18 NOTE — Telephone Encounter (Signed)
Patient was seen in May and pharmacy is [facility].

## 2015-11-18 NOTE — Telephone Encounter (Signed)
Please resend this rx to [facility].

## 2015-11-25 DIAGNOSIS — J301 Allergic rhinitis due to pollen: Secondary | ICD-10-CM | POA: Diagnosis not present

## 2015-12-16 DIAGNOSIS — J301 Allergic rhinitis due to pollen: Secondary | ICD-10-CM | POA: Diagnosis not present

## 2015-12-26 DIAGNOSIS — J301 Allergic rhinitis due to pollen: Secondary | ICD-10-CM | POA: Diagnosis not present

## 2016-01-10 DIAGNOSIS — J301 Allergic rhinitis due to pollen: Secondary | ICD-10-CM | POA: Diagnosis not present

## 2016-01-12 ENCOUNTER — Other Ambulatory Visit: Payer: Self-pay | Admitting: Unknown Physician Specialty

## 2016-01-17 DIAGNOSIS — J301 Allergic rhinitis due to pollen: Secondary | ICD-10-CM | POA: Diagnosis not present

## 2016-01-24 DIAGNOSIS — J301 Allergic rhinitis due to pollen: Secondary | ICD-10-CM | POA: Diagnosis not present

## 2016-02-17 DIAGNOSIS — J301 Allergic rhinitis due to pollen: Secondary | ICD-10-CM | POA: Diagnosis not present

## 2016-02-21 DIAGNOSIS — J301 Allergic rhinitis due to pollen: Secondary | ICD-10-CM | POA: Diagnosis not present

## 2016-02-27 DIAGNOSIS — J301 Allergic rhinitis due to pollen: Secondary | ICD-10-CM | POA: Diagnosis not present

## 2016-03-05 DIAGNOSIS — J301 Allergic rhinitis due to pollen: Secondary | ICD-10-CM | POA: Diagnosis not present

## 2016-03-16 DIAGNOSIS — J301 Allergic rhinitis due to pollen: Secondary | ICD-10-CM | POA: Diagnosis not present

## 2016-04-12 ENCOUNTER — Other Ambulatory Visit: Payer: Self-pay | Admitting: Unknown Physician Specialty

## 2016-04-20 DIAGNOSIS — J301 Allergic rhinitis due to pollen: Secondary | ICD-10-CM | POA: Diagnosis not present

## 2016-04-27 ENCOUNTER — Other Ambulatory Visit: Payer: Self-pay | Admitting: Unknown Physician Specialty

## 2016-04-30 DIAGNOSIS — J301 Allergic rhinitis due to pollen: Secondary | ICD-10-CM | POA: Diagnosis not present

## 2016-05-01 ENCOUNTER — Ambulatory Visit: Payer: BLUE CROSS/BLUE SHIELD | Admitting: Unknown Physician Specialty

## 2016-05-06 ENCOUNTER — Other Ambulatory Visit: Payer: Self-pay | Admitting: Unknown Physician Specialty

## 2016-05-11 DIAGNOSIS — J301 Allergic rhinitis due to pollen: Secondary | ICD-10-CM | POA: Diagnosis not present

## 2016-05-21 DIAGNOSIS — J301 Allergic rhinitis due to pollen: Secondary | ICD-10-CM | POA: Diagnosis not present

## 2016-05-28 ENCOUNTER — Other Ambulatory Visit: Payer: Self-pay | Admitting: Unknown Physician Specialty

## 2016-06-02 DIAGNOSIS — J301 Allergic rhinitis due to pollen: Secondary | ICD-10-CM | POA: Diagnosis not present

## 2016-06-12 ENCOUNTER — Encounter: Payer: BLUE CROSS/BLUE SHIELD | Admitting: Unknown Physician Specialty

## 2016-06-22 DIAGNOSIS — J301 Allergic rhinitis due to pollen: Secondary | ICD-10-CM | POA: Diagnosis not present

## 2016-06-24 ENCOUNTER — Encounter: Payer: Self-pay | Admitting: Unknown Physician Specialty

## 2016-06-24 ENCOUNTER — Ambulatory Visit (INDEPENDENT_AMBULATORY_CARE_PROVIDER_SITE_OTHER): Payer: BLUE CROSS/BLUE SHIELD | Admitting: Unknown Physician Specialty

## 2016-06-24 VITALS — BP 138/88 | HR 76 | Temp 97.4°F | Ht 72.2 in | Wt 194.4 lb

## 2016-06-24 DIAGNOSIS — Z0000 Encounter for general adult medical examination without abnormal findings: Secondary | ICD-10-CM

## 2016-06-24 DIAGNOSIS — I10 Essential (primary) hypertension: Secondary | ICD-10-CM | POA: Diagnosis not present

## 2016-06-24 DIAGNOSIS — J45909 Unspecified asthma, uncomplicated: Secondary | ICD-10-CM | POA: Diagnosis not present

## 2016-06-24 DIAGNOSIS — E7800 Pure hypercholesterolemia, unspecified: Secondary | ICD-10-CM | POA: Diagnosis not present

## 2016-06-24 MED ORDER — AMLODIPINE BESYLATE 5 MG PO TABS: 10.0000 mg | ORAL_TABLET | Freq: Every day | ORAL | 1 refills | Status: DC

## 2016-06-24 MED ORDER — FLUTICASONE-SALMETEROL 100-50 MCG/DOSE IN AEPB: 1.0000 | INHALATION_SPRAY | Freq: Two times a day (BID) | RESPIRATORY_TRACT | 12 refills | Status: DC

## 2016-06-24 NOTE — Assessment & Plan Note (Signed)
Stable, continue present medications.   

## 2016-06-24 NOTE — Progress Notes (Signed)
BP 138/88 (BP Location: Left Arm, Cuff Size: Large)   Pulse 76   Temp 97.4 F (36.3 C)   Ht 6' 0.2" (1.834 m)   Wt 194 lb 6.4 oz (88.2 kg)   SpO2 98%   BMI 26.22 kg/m    Subjective:    Patient ID: Jonathan Roberson, male    DOB: 06/14/1955, 61 y.o.   MRN: [id number]  HPI: Jonathan Roberson is a 61 y.o. male  Chief Complaint  Patient presents with  . Annual Exam    pt wants to discuss colonoscopy and zostavax   Hypertension Using medications without difficulty Average home BPs Not checking  No problems or lightheadedness No chest pain with exertion or shortness of breath No Edema  Hyperlipidemia Not on statins and needs recheck today Recheck today  Depression Stable "for the most part." Depression screen [facility] 2/9 06/24/2016 10/29/2015 04/29/2015  Decreased Interest 0 0 2  Down, Depressed, Hopeless 1 0 1  PHQ - 2 Score 1 0 3  Altered sleeping - - 2  Tired, decreased energy - - 2  Change in appetite - - 0  Feeling bad or failure about yourself  - - 0  Trouble concentrating - - 1  Moving slowly or fidgety/restless - - 1  Suicidal thoughts - - 0  PHQ-9 Score - - 9   Asthma Stable on present treatment.  Taking Advair mostly on a regular basis/  Does not have to take rescue inhaler.  Lifts weights every other day and walks.    Relevant past medical, surgical, family and social history reviewed and updated as indicated. Interim medical history since our last visit reviewed. Allergies and medications reviewed and updated.  Review of Systems  Per HPI unless specifically indicated above     Objective:    BP 138/88 (BP Location: Left Arm, Cuff Size: Large)   Pulse 76   Temp 97.4 F (36.3 C)   Ht 6' 0.2" (1.834 m)   Wt 194 lb 6.4 oz (88.2 kg)   SpO2 98%   BMI 26.22 kg/m   Wt Readings from Last 3 Encounters:  06/24/16 194 lb 6.4 oz (88.2 kg)  10/29/15 193 lb 6.4 oz (87.7 kg)  09/24/15 194 lb (88 kg)    Physical Exam  Constitutional: He is oriented to person,  place, and time. He appears well-developed and well-nourished.  HENT:  Head: Normocephalic.  Right Ear: Tympanic membrane, external ear and ear canal normal.  Left Ear: Tympanic membrane, external ear and ear canal normal.  Mouth/Throat: Uvula is midline, oropharynx is clear and moist and mucous membranes are normal.  Eyes: Pupils are equal, round, and reactive to light.  Cardiovascular: Normal rate, regular rhythm and normal heart sounds.  Exam reveals no gallop and no friction rub.   No murmur heard. Pulmonary/Chest: Effort normal and breath sounds normal. No respiratory distress.  Abdominal: Soft. Bowel sounds are normal. He exhibits no distension. There is no tenderness.  Musculoskeletal: Normal range of motion.  Neurological: He is alert and oriented to person, place, and time. He has normal reflexes.  Skin: Skin is warm and dry.  Psychiatric: He has a normal mood and affect. His behavior is normal. Judgment and thought content normal.       Assessment & Plan:   Problem List Items Addressed This Visit      Unprioritized   Asthma    Stable, continue present medications.        Relevant Medications  Fluticasone-Salmeterol (ADVAIR) 100-50 MCG/DOSE AEPB   Hyperlipidemia   Relevant Medications   amLODipine (NORVASC) 5 MG tablet   Other Relevant Orders   Lipid Panel w/o Chol/HDL Ratio   Hypertension    Stable, continue present medications.        Relevant Medications   amLODipine (NORVASC) 5 MG tablet   Other Relevant Orders   Comprehensive metabolic panel    Other Visit Diagnoses    Routine general medical examination at a health care facility    -  Primary   Relevant Orders   Ambulatory referral to Gastroenterology   TSH   PSA   CBC with Differential/Platelet       Follow up plan: Return in about 6 months (around 12/22/2016).

## 2016-06-25 LAB — COMPREHENSIVE METABOLIC PANEL
ALBUMIN: 4.2 g/dL (ref 3.6–4.8)
ALK PHOS: 63 IU/L (ref 39–117)
ALT: 26 IU/L (ref 0–44)
AST: 24 IU/L (ref 0–40)
Albumin/Globulin Ratio: 1.2 (ref 1.2–2.2)
BUN / CREAT RATIO: 14 (ref 10–24)
BUN: 9 mg/dL (ref 8–27)
Bilirubin Total: 0.6 mg/dL (ref 0.0–1.2)
CO2: 26 mmol/L (ref 18–29)
CREATININE: 0.63 mg/dL — AB (ref 0.76–1.27)
Calcium: 8.9 mg/dL (ref 8.6–10.2)
Chloride: 93 mmol/L — ABNORMAL LOW (ref 96–106)
GFR calc non Af Amer: 107 mL/min/{1.73_m2} (ref >59)
GFR, EST AFRICAN AMERICAN: 124 mL/min/{1.73_m2} (ref >59)
GLOBULIN, TOTAL: 3.4 g/dL (ref 1.5–4.5)
Glucose: 109 mg/dL — ABNORMAL HIGH (ref 65–99)
Potassium: 3.8 mmol/L (ref 3.5–5.2)
SODIUM: 135 mmol/L (ref 134–144)
TOTAL PROTEIN: 7.6 g/dL (ref 6.0–8.5)

## 2016-06-25 LAB — CBC WITH DIFFERENTIAL/PLATELET
Basophils Absolute: 0 10*3/uL (ref 0.0–0.2)
Basos: 1 %
EOS (ABSOLUTE): 0.4 10*3/uL (ref 0.0–0.4)
EOS: 5 %
HEMATOCRIT: 38.6 % (ref 37.5–51.0)
HEMOGLOBIN: 13.1 g/dL (ref 13.0–17.7)
IMMATURE GRANS (ABS): 0 10*3/uL (ref 0.0–0.1)
IMMATURE GRANULOCYTES: 0 %
LYMPHS: 14 %
Lymphocytes Absolute: 1.1 10*3/uL (ref 0.7–3.1)
MCH: 31 pg (ref 26.6–33.0)
MCHC: 33.9 g/dL (ref 31.5–35.7)
MCV: 92 fL (ref 79–97)
MONOCYTES: 8 %
Monocytes Absolute: 0.6 10*3/uL (ref 0.1–0.9)
NEUTROS ABS: 5.5 10*3/uL (ref 1.4–7.0)
NEUTROS PCT: 72 %
PLATELETS: 222 10*3/uL (ref 150–379)
RBC: 4.22 x10E6/uL (ref 4.14–5.80)
RDW: 13 % (ref 12.3–15.4)
WBC: 7.6 10*3/uL (ref 3.4–10.8)

## 2016-06-25 LAB — LIPID PANEL W/O CHOL/HDL RATIO
Cholesterol, Total: 211 mg/dL — ABNORMAL HIGH (ref 100–199)
HDL: 80 mg/dL (ref >39)
LDL CALC: 98 mg/dL (ref 0–99)
Triglycerides: 166 mg/dL — ABNORMAL HIGH (ref 0–149)
VLDL CHOLESTEROL CAL: 33 mg/dL (ref 5–40)

## 2016-06-25 LAB — TSH: TSH: 0.686 u[IU]/mL (ref 0.450–4.500)

## 2016-06-25 LAB — PSA: Prostate Specific Ag, Serum: 2.2 ng/mL (ref 0.0–4.0)

## 2016-06-26 DIAGNOSIS — J301 Allergic rhinitis due to pollen: Secondary | ICD-10-CM | POA: Diagnosis not present

## 2016-06-29 DIAGNOSIS — J301 Allergic rhinitis due to pollen: Secondary | ICD-10-CM | POA: Diagnosis not present

## 2016-07-14 DIAGNOSIS — J301 Allergic rhinitis due to pollen: Secondary | ICD-10-CM | POA: Diagnosis not present

## 2016-07-20 DIAGNOSIS — J301 Allergic rhinitis due to pollen: Secondary | ICD-10-CM | POA: Diagnosis not present

## 2016-07-23 ENCOUNTER — Telehealth: Payer: Self-pay

## 2016-07-23 ENCOUNTER — Other Ambulatory Visit: Payer: Self-pay

## 2016-07-23 DIAGNOSIS — Z1211 Encounter for screening for malignant neoplasm of colon: Secondary | ICD-10-CM

## 2016-07-23 NOTE — Telephone Encounter (Signed)
Gastroenterology Pre-Procedure Review  Request Date: 3/16/148  Requesting Physician: Dr. Allen Norris  PATIENT REVIEW QUESTIONS: The patient responded to the following health history questions as indicated:    1. Are you having any GI issues? no 2. Do you have a personal history of Polyps? no 3. Do you have a family history of Colon Cancer or Polyps? no 4. Diabetes Mellitus? no 5. Joint replacements in the past 12 months?no 6. Major health problems in the past 3 months?no 7. Any artificial heart valves, MVP, or defibrillator?no    MEDICATIONS & ALLERGIES:    Patient reports the following regarding taking any anticoagulation/antiplatelet therapy:   Plavix, Coumadin, Eliquis, Xarelto, Lovenox, Pradaxa, Brilinta, or Effient? no Aspirin? yes (81mg )  Patient confirms/reports the following medications:  Current Outpatient Prescriptions  Medication Sig Dispense Refill  . albuterol (PROVENTIL HFA;VENTOLIN HFA) 108 (90 Base) MCG/ACT inhaler Inhale 2 puffs into the lungs every 6 (six) hours as needed for wheezing or shortness of breath. Reported on 09/24/2015 3 Inhaler 1  . amLODipine (NORVASC) 5 MG tablet Take 2 tablets (10 mg total) by mouth daily. 180 tablet 1  . Ascorbic Acid (VITAMIN C PO) Take by mouth.    Marland Kitchen b complex vitamins tablet Take 1 tablet by mouth daily.    . fluticasone (FLONASE) 50 MCG/ACT nasal spray USE 2 SPRAYS NASALLY DAILY 48 g 2  . Fluticasone-Salmeterol (ADVAIR) 100-50 MCG/DOSE AEPB Inhale 1 puff into the lungs 2 (two) times daily. 60 each 12  . Glucosamine-Chondroitin (GLUCOSAMINE CHONDR COMPLEX PO) Take by mouth.    Marland Kitchen lisinopril (PRINIVIL,ZESTRIL) 10 MG tablet TAKE 1 TABLET DAILY (Patient not taking: Reported on 06/24/2016) 90 tablet 1  . loratadine (CLARITIN) 10 MG tablet Take 10 mg by mouth daily as needed for allergies.    Marland Kitchen MAGNESIUM-POTASSIUM PO Take by mouth daily.    . Multiple Vitamins-Minerals (MULTIVITAMIN PO) Take by mouth.    . Multiple Vitamins-Minerals (ZINC PO)  Take by mouth.    . Omega-3 Fatty Acids (OMEGA-3 FISH OIL PO) Take by mouth.    Marland Kitchen PARoxetine (PAXIL) 20 MG tablet TAKE 1 TABLET DAILY 90 tablet 1  . VITAMIN D, ERGOCALCIFEROL, PO Take 1 tablet by mouth daily.     No current facility-administered medications for this visit.     Patient confirms/reports the following allergies:  Allergies  Allergen Reactions  . Shrimp [Shellfish Allergy] Shortness Of Breath    When eaten in large amounts  . Monosodium Glutamate     headaches  . Amoxicillin Rash  . Erythromycin Rash  . Penicillins Rash    No orders of the defined types were placed in this encounter.   AUTHORIZATION INFORMATION Primary Insurance: 1D#: Group #:  Secondary Insurance: 1D#: Group #:  SCHEDULE INFORMATION: Date: 08/14/16 Time: Location: [place]

## 2016-07-27 DIAGNOSIS — J301 Allergic rhinitis due to pollen: Secondary | ICD-10-CM | POA: Diagnosis not present

## 2016-07-30 DIAGNOSIS — J301 Allergic rhinitis due to pollen: Secondary | ICD-10-CM | POA: Diagnosis not present

## 2016-07-31 DIAGNOSIS — J301 Allergic rhinitis due to pollen: Secondary | ICD-10-CM | POA: Diagnosis not present

## 2016-08-06 DIAGNOSIS — J301 Allergic rhinitis due to pollen: Secondary | ICD-10-CM | POA: Diagnosis not present

## 2016-08-10 ENCOUNTER — Encounter: Payer: Self-pay | Admitting: *Deleted

## 2016-08-13 NOTE — Discharge Instructions (Signed)

## 2016-08-14 ENCOUNTER — Encounter
Admission: RE | Disposition: A | Discharge status: Home / Self Care | Payer: Self-pay | Source: Ambulatory Visit | Attending: Gastroenterology

## 2016-08-14 ENCOUNTER — Ambulatory Visit: Payer: BLUE CROSS/BLUE SHIELD | Admitting: Anesthesiology

## 2016-08-14 ENCOUNTER — Ambulatory Visit
Admission: RE | Admit: 2016-08-14 | Discharge: 2016-08-14 | Disposition: A | Discharge status: Home / Self Care | Payer: BLUE CROSS/BLUE SHIELD | Source: Ambulatory Visit | Attending: Gastroenterology | Admitting: Gastroenterology

## 2016-08-14 DIAGNOSIS — D124 Benign neoplasm of descending colon: Secondary | ICD-10-CM | POA: Insufficient documentation

## 2016-08-14 DIAGNOSIS — K640 First degree hemorrhoids: Secondary | ICD-10-CM | POA: Insufficient documentation

## 2016-08-14 DIAGNOSIS — Z825 Family history of asthma and other chronic lower respiratory diseases: Secondary | ICD-10-CM | POA: Diagnosis not present

## 2016-08-14 DIAGNOSIS — D125 Benign neoplasm of sigmoid colon: Secondary | ICD-10-CM | POA: Diagnosis not present

## 2016-08-14 DIAGNOSIS — M19041 Primary osteoarthritis, right hand: Secondary | ICD-10-CM | POA: Diagnosis not present

## 2016-08-14 DIAGNOSIS — Z1211 Encounter for screening for malignant neoplasm of colon: Secondary | ICD-10-CM

## 2016-08-14 DIAGNOSIS — M170 Bilateral primary osteoarthritis of knee: Secondary | ICD-10-CM | POA: Diagnosis not present

## 2016-08-14 DIAGNOSIS — Z79899 Other long term (current) drug therapy: Secondary | ICD-10-CM | POA: Diagnosis not present

## 2016-08-14 DIAGNOSIS — Z8261 Family history of arthritis: Secondary | ICD-10-CM | POA: Diagnosis not present

## 2016-08-14 DIAGNOSIS — M19042 Primary osteoarthritis, left hand: Secondary | ICD-10-CM | POA: Insufficient documentation

## 2016-08-14 DIAGNOSIS — M160 Bilateral primary osteoarthritis of hip: Secondary | ICD-10-CM | POA: Insufficient documentation

## 2016-08-14 DIAGNOSIS — K635 Polyp of colon: Secondary | ICD-10-CM

## 2016-08-14 DIAGNOSIS — J3089 Other allergic rhinitis: Secondary | ICD-10-CM | POA: Diagnosis not present

## 2016-08-14 DIAGNOSIS — J45909 Unspecified asthma, uncomplicated: Secondary | ICD-10-CM | POA: Diagnosis not present

## 2016-08-14 DIAGNOSIS — F329 Major depressive disorder, single episode, unspecified: Secondary | ICD-10-CM | POA: Insufficient documentation

## 2016-08-14 DIAGNOSIS — Z818 Family history of other mental and behavioral disorders: Secondary | ICD-10-CM | POA: Insufficient documentation

## 2016-08-14 DIAGNOSIS — Z91013 Allergy to seafood: Secondary | ICD-10-CM | POA: Diagnosis not present

## 2016-08-14 DIAGNOSIS — Z880 Allergy status to penicillin: Secondary | ICD-10-CM | POA: Diagnosis not present

## 2016-08-14 DIAGNOSIS — Z8249 Family history of ischemic heart disease and other diseases of the circulatory system: Secondary | ICD-10-CM | POA: Diagnosis not present

## 2016-08-14 DIAGNOSIS — Z9102 Food additives allergy status: Secondary | ICD-10-CM | POA: Diagnosis not present

## 2016-08-14 DIAGNOSIS — Z881 Allergy status to other antibiotic agents status: Secondary | ICD-10-CM | POA: Diagnosis not present

## 2016-08-14 DIAGNOSIS — E785 Hyperlipidemia, unspecified: Secondary | ICD-10-CM | POA: Diagnosis not present

## 2016-08-14 DIAGNOSIS — I10 Essential (primary) hypertension: Secondary | ICD-10-CM | POA: Diagnosis not present

## 2016-08-14 HISTORY — PX: BIOPSY: SHX5522

## 2016-08-14 HISTORY — PX: COLONOSCOPY WITH PROPOFOL: SHX5780

## 2016-08-14 SURGERY — COLONOSCOPY WITH PROPOFOL
Anesthesia: Monitor Anesthesia Care | Site: Rectum

## 2016-08-14 MED ORDER — STERILE WATER FOR IRRIGATION IR SOLN
Status: DC | PRN
  Administered 2016-08-14: 09:00:00

## 2016-08-14 MED ORDER — LIDOCAINE HCL (CARDIAC) 20 MG/ML IV SOLN
INTRAVENOUS | Status: DC | PRN
  Administered 2016-08-14: 50 mg via INTRAVENOUS

## 2016-08-14 MED ORDER — PROPOFOL 10 MG/ML IV BOLUS
INTRAVENOUS | Status: DC | PRN
  Administered 2016-08-14: 100 mg via INTRAVENOUS
  Administered 2016-08-14 (×2): 20 mg via INTRAVENOUS
  Administered 2016-08-14: 50 mg via INTRAVENOUS
  Administered 2016-08-14 (×2): 20 mg via INTRAVENOUS

## 2016-08-14 MED ORDER — LACTATED RINGERS IV SOLN
INTRAVENOUS | Status: DC | PRN
  Administered 2016-08-14: 08:00:00 via INTRAVENOUS

## 2016-08-14 SURGICAL SUPPLY — 23 items

## 2016-08-14 NOTE — Op Note (Signed)
[facility] Gastroenterology Patient Name: Jonathan Roberson Procedure Date: 08/14/2016 8:31 AM MRN: [id number] Account #: [id number] Date of Birth: 09-10-1955 Admit Type: Outpatient Age: 61 Room: [facility] OR ROOM 01 Gender: Male Note Status: Finalized Procedure:            Colonoscopy Indications:          Screening for colorectal malignant neoplasm Providers:            Lucilla Lame MD, MD Referring MD:         Kathrine Haddock (Referring MD) Medicines:            Propofol per Anesthesia Complications:        No immediate complications. Procedure:            Pre-Anesthesia Assessment:                       - Prior to the procedure, a History and Physical was                        performed, and patient medications and allergies were                        reviewed. The patient's tolerance of previous                        anesthesia was also reviewed. The risks and benefits of                        the procedure and the sedation options and risks were                        discussed with the patient. All questions were                        answered, and informed consent was obtained. Prior                        Anticoagulants: The patient has taken no previous                        anticoagulant or antiplatelet agents. ASA Grade                        Assessment: II - A patient with mild systemic disease.                        After reviewing the risks and benefits, the patient was                        deemed in satisfactory condition to undergo the                        procedure.                       After obtaining informed consent, the colonoscope was                        passed under direct vision. Throughout the procedure,  the patient's blood pressure, pulse, and oxygen                        saturations were monitored continuously. The Olympus                        Colonoscope [id number] was introduced through the                anus and advanced to the the cecum, identified by                        appendiceal orifice and ileocecal valve. The                        colonoscopy was performed without difficulty. The                        patient tolerated the procedure well. The quality of                        the bowel preparation was excellent. Findings:      The perianal and digital rectal examinations were normal.      Four sessile polyps were found in the sigmoid colon. The polyps were 4       to 5 mm in size. These polyps were removed with a cold snare. Resection       and retrieval were complete.      A 4 mm polyp was found in the descending colon. The polyp was sessile.       The polyp was removed with a cold snare. Resection and retrieval were       complete.      Non-bleeding internal hemorrhoids were found during retroflexion. The       hemorrhoids were Grade I (internal hemorrhoids that do not prolapse). Impression:           - Four 4 to 5 mm polyps in the sigmoid colon, removed                        with a cold snare. Resected and retrieved.                       - One 4 mm polyp in the descending colon, removed with                        a cold snare. Resected and retrieved.                       - Non-bleeding internal hemorrhoids. Recommendation:       - Discharge patient to home.                       - Resume previous diet.                       - Continue present medications.                       - Await pathology results.                       - Repeat colonoscopy in 5  years if polyp adenoma and 10                        years if hyperplastic Procedure Code(s):    --- Professional ---                       [phone], Colonoscopy, flexible; with removal of tumor(s),                        polyp(s), or other lesion(s) by snare technique Diagnosis Code(s):    --- Professional ---                       Z12.11, Encounter for screening for malignant neoplasm                         of colon                       D12.5, Benign neoplasm of sigmoid colon                       D12.4, Benign neoplasm of descending colon CPT copyright 2016 American Medical Association. All rights reserved. The codes documented in this report are preliminary and upon coder review may  be revised to meet current compliance requirements. Lucilla Lame MD, MD 08/14/2016 9:04:46 AM This report has been signed electronically. Number of Addenda: 0 Note Initiated On: 08/14/2016 8:31 AM Scope Withdrawal Time: 0 hours 8 minutes 13 seconds  Total Procedure Duration: 0 hours 17 minutes 26 seconds       [facility]

## 2016-08-14 NOTE — Transfer of Care (Signed)
Immediate Anesthesia Transfer of Care Note  Patient: Jonathan Roberson  Procedure(s) Performed: Procedure(s): COLONOSCOPY WITH PROPOFOL (N/A) BIOPSY (N/A)  Patient Location: PACU  Anesthesia Type: MAC  Level of Consciousness: awake, alert  and patient cooperative  Airway and Oxygen Therapy: Patient Spontanous Breathing and Patient connected to supplemental oxygen  Post-op Assessment: Post-op Vital signs reviewed, Patient's Cardiovascular Status Stable, Respiratory Function Stable, Patent Airway and No signs of Nausea or vomiting  Post-op Vital Signs: Reviewed and stable  Complications: No apparent anesthesia complications

## 2016-08-14 NOTE — Anesthesia Postprocedure Evaluation (Signed)
Anesthesia Post Note  Patient: Jonathan Roberson  Procedure(s) Performed: Procedure(s) (LRB): COLONOSCOPY WITH PROPOFOL (N/A) BIOPSY (N/A)  Patient location during evaluation: PACU Anesthesia Type: MAC Level of consciousness: awake and alert Pain management: pain level controlled Vital Signs Assessment: post-procedure vital signs reviewed and stable Respiratory status: spontaneous breathing, nonlabored ventilation, respiratory function stable and patient connected to nasal cannula oxygen Cardiovascular status: stable and blood pressure returned to baseline Anesthetic complications: no    Calyx Hawker

## 2016-08-14 NOTE — H&P (Signed)
Jonathan Lame, MD [facility] [address], [place], [place] [address] Phone: [phone] Fax : [phone]  Primary Care Physician:  Kathrine Haddock, NP Primary Gastroenterologist:  Dr. Allen Norris  Pre-Procedure History & Physical: HPI:  Jonathan Roberson is a 61 y.o. male is here for a screening colonoscopy.   Past Medical History:  Diagnosis Date  . Allergy   . Allergy to dust   . Arthritis    hands, hips, knees  . Asthma   . Depression   . Hyperlipidemia   . Hypertension     Past Surgical History:  Procedure Laterality Date  . IMAGE GUIDED SINUS SURGERY Bilateral 09/24/2015   Procedure: IMAGE GUIDED SINUS SURGERY Bilateral nasal polypectomy,maxillary antrostomy with tissue removal,bilateral ethmoidectomy,frontal sinusotomy;  Surgeon: Clyde Canterbury, MD;  Location: [facility];  Service: ENT;  Laterality: Bilateral;  GAVE DISK TO CE CE 4-12 KP  . NASAL SINUS SURGERY  2005   Dr. Nadeen Landau, [facility]    Prior to Admission medications   Medication Sig Start Date End Date Taking? Authorizing Provider  amLODipine (NORVASC) 5 MG tablet Take 2 tablets (10 mg total) by mouth daily. 06/24/16  Yes Kathrine Haddock, NP  Ascorbic Acid (VITAMIN C PO) Take by mouth.   Yes Historical Provider, MD  b complex vitamins tablet Take 1 tablet by mouth daily.   Yes Historical Provider, MD  fluticasone (FLONASE) 50 MCG/ACT nasal spray USE 2 SPRAYS NASALLY DAILY 09/10/15  Yes Megan P Johnson, DO  Fluticasone-Salmeterol (ADVAIR) 100-50 MCG/DOSE AEPB Inhale 1 puff into the lungs 2 (two) times daily. 06/24/16  Yes Kathrine Haddock, NP  Glucosamine-Chondroitin (GLUCOSAMINE CHONDR COMPLEX PO) Take by mouth.   Yes Historical Provider, MD  loratadine (CLARITIN) 10 MG tablet Take 10 mg by mouth daily as needed for allergies.   Yes Historical Provider, MD  MAGNESIUM-POTASSIUM PO Take by mouth daily.   Yes Historical Provider, MD  Multiple Vitamins-Minerals (MULTIVITAMIN PO) Take by mouth.   Yes Historical Provider,  MD  Multiple Vitamins-Minerals (ZINC PO) Take by mouth.   Yes Historical Provider, MD  Omega-3 Fatty Acids (OMEGA-3 FISH OIL PO) Take by mouth.   Yes Historical Provider, MD  PARoxetine (PAXIL) 20 MG tablet TAKE 1 TABLET DAILY 05/06/16  Yes Kathrine Haddock, NP  VITAMIN D, ERGOCALCIFEROL, PO Take 1 tablet by mouth daily.   Yes Historical Provider, MD  albuterol (PROVENTIL HFA;VENTOLIN HFA) 108 (90 Base) MCG/ACT inhaler Inhale 2 puffs into the lungs every 6 (six) hours as needed for wheezing or shortness of breath. Reported on 09/24/2015 10/29/15   Kathrine Haddock, NP  lisinopril (PRINIVIL,ZESTRIL) 10 MG tablet TAKE 1 TABLET DAILY Patient not taking: Reported on 06/24/2016 04/27/16   Kathrine Haddock, NP    Allergies as of 07/23/2016 - Review Complete 06/24/2016  Allergen Reaction Noted  . Shrimp [shellfish allergy] Shortness Of Breath 09/17/2015  . Monosodium glutamate  09/17/2015  . Amoxicillin Rash 04/16/2015  . Erythromycin Rash 04/16/2015  . Penicillins Rash 04/16/2015    Family History  Problem Relation Age of Onset  . Heart disease Mother   . Depression Mother   . Arthritis Sister   . Emphysema Father   . Depression Daughter     Social History   Social History  . Marital status: Married    Spouse name: N/A  . Number of children: N/A  . Years of education: N/A   Occupational History  . Not on file.   Social History Main Topics  . Smoking status: Never Smoker  .  Smokeless tobacco: Never Used  . Alcohol use 0.0 oz/week     Comment: on occasion- about once a year  . Drug use: No  . Sexual activity: Yes   Other Topics Concern  . Not on file   Social History Narrative  . No narrative on file    Review of Systems: See HPI, otherwise negative ROS  Physical Exam: BP (!) 150/96   Pulse 76   Temp 98.8 F (37.1 C) (Temporal)   Ht 6' (1.829 m)   Wt 187 lb (84.8 kg)   SpO2 98%   BMI 25.36 kg/m  General:   Alert,  pleasant and cooperative in NAD Head:  Normocephalic and  atraumatic. Neck:  Supple; no masses or thyromegaly. Lungs:  Clear throughout to auscultation.    Heart:  Regular rate and rhythm. Abdomen:  Soft, nontender and nondistended. Normal bowel sounds, without guarding, and without rebound.   Neurologic:  Alert and  oriented x4;  grossly normal neurologically.  Impression/Plan: Jonathan Roberson is now here to undergo a screening colonoscopy.  Risks, benefits, and alternatives regarding colonoscopy have been reviewed with the patient.  Questions have been answered.  All parties agreeable.

## 2016-08-14 NOTE — Anesthesia Preprocedure Evaluation (Signed)
Anesthesia Evaluation  Patient identified by MRN, date of birth, ID band  Reviewed: NPO status   History of Anesthesia Complications Negative for: history of anesthetic complications  Airway Mallampati: II  TM Distance: >3 FB Neck ROM: full    Dental no notable dental hx.    Pulmonary asthma (mild) ,    Pulmonary exam normal        Cardiovascular Exercise Tolerance: Good hypertension, Normal cardiovascular exam     Neuro/Psych PSYCHIATRIC DISORDERS Depression negative neurological ROS  negative psych ROS   GI/Hepatic negative GI ROS, Neg liver ROS,   Endo/Other  negative endocrine ROS  Renal/GU negative Renal ROS  negative genitourinary   Musculoskeletal  (+) Arthritis ,   Abdominal   Peds  Hematology negative hematology ROS (+)   Anesthesia Other Findings   Reproductive/Obstetrics                             Anesthesia Physical  Anesthesia Plan  ASA: II  Anesthesia Plan: MAC   Post-op Pain Management:    Induction:   Airway Management Planned:   Additional Equipment:   Intra-op Plan:   Post-operative Plan:   Informed Consent: I have reviewed the patients History and Physical, chart, labs and discussed the procedure including the risks, benefits and alternatives for the proposed anesthesia with the patient or authorized representative who has indicated his/her understanding and acceptance.     Plan Discussed with: CRNA  Anesthesia Plan Comments:         Anesthesia Quick Evaluation

## 2016-08-14 NOTE — Anesthesia Procedure Notes (Signed)
Procedure Name: MAC Performed by: Hudsen Fei Pre-anesthesia Checklist: Patient identified, Emergency Drugs available, Suction available, Timeout performed and Patient being monitored Patient Re-evaluated:Patient Re-evaluated prior to inductionOxygen Delivery Method: Nasal cannula Placement Confirmation: positive ETCO2     

## 2016-08-17 ENCOUNTER — Encounter: Payer: Self-pay | Admitting: Gastroenterology

## 2016-08-18 ENCOUNTER — Encounter: Payer: Self-pay | Admitting: Gastroenterology

## 2016-08-21 DIAGNOSIS — J301 Allergic rhinitis due to pollen: Secondary | ICD-10-CM | POA: Diagnosis not present

## 2016-08-25 DIAGNOSIS — J301 Allergic rhinitis due to pollen: Secondary | ICD-10-CM | POA: Diagnosis not present

## 2016-09-01 DIAGNOSIS — J301 Allergic rhinitis due to pollen: Secondary | ICD-10-CM | POA: Diagnosis not present

## 2016-09-04 DIAGNOSIS — J301 Allergic rhinitis due to pollen: Secondary | ICD-10-CM | POA: Diagnosis not present

## 2016-09-10 DIAGNOSIS — J301 Allergic rhinitis due to pollen: Secondary | ICD-10-CM | POA: Diagnosis not present

## 2016-09-17 DIAGNOSIS — J301 Allergic rhinitis due to pollen: Secondary | ICD-10-CM | POA: Diagnosis not present

## 2016-09-25 DIAGNOSIS — J301 Allergic rhinitis due to pollen: Secondary | ICD-10-CM | POA: Diagnosis not present

## 2016-10-01 DIAGNOSIS — J301 Allergic rhinitis due to pollen: Secondary | ICD-10-CM | POA: Diagnosis not present

## 2016-10-02 ENCOUNTER — Other Ambulatory Visit: Payer: Self-pay

## 2016-10-02 MED ORDER — FLUTICASONE PROPIONATE 50 MCG/ACT NA SUSP: 2.0000 | Freq: Every day | NASAL | 12 refills | Status: DC

## 2016-10-05 DIAGNOSIS — J301 Allergic rhinitis due to pollen: Secondary | ICD-10-CM | POA: Diagnosis not present

## 2016-10-08 DIAGNOSIS — J301 Allergic rhinitis due to pollen: Secondary | ICD-10-CM | POA: Diagnosis not present

## 2016-10-15 DIAGNOSIS — J301 Allergic rhinitis due to pollen: Secondary | ICD-10-CM | POA: Diagnosis not present

## 2016-10-22 DIAGNOSIS — J301 Allergic rhinitis due to pollen: Secondary | ICD-10-CM | POA: Diagnosis not present

## 2016-10-24 ENCOUNTER — Other Ambulatory Visit: Payer: Self-pay | Admitting: Unknown Physician Specialty

## 2016-10-29 DIAGNOSIS — J301 Allergic rhinitis due to pollen: Secondary | ICD-10-CM | POA: Diagnosis not present

## 2016-11-02 ENCOUNTER — Telehealth: Payer: Self-pay | Admitting: Unknown Physician Specialty

## 2016-11-02 ENCOUNTER — Other Ambulatory Visit: Payer: Self-pay | Admitting: Unknown Physician Specialty

## 2016-11-02 NOTE — Telephone Encounter (Signed)
Patient asked if he could get the Fluticasone-Salmeterol (ADVAIR) 100-50 in a more affordable form. Patient said he will make an appointment if it is required but would like to see if the provider was able to call in a more affordable form of the prescription. Patient would like to be notified on the decision and if the prescription is ready.  Please Advise.  Thank you

## 2016-11-02 NOTE — Telephone Encounter (Signed)
Is he self-pay?  If so, we don't have more affordable medications.  Please direct to assistance through pharmacuetical co.

## 2016-11-02 NOTE — Telephone Encounter (Signed)
Routing to provider. I looked in the closet and we do not have any samples for this dosage or savings cards. We do have a sample of the 250/50.

## 2016-11-02 NOTE — Telephone Encounter (Signed)
Called and spoke with patient. I asked patient if he has [employer] (according to chart, he does) and patient stated he did. I explained to the patient that what we usually ask patient's to do in this situation is to call the insurance company and ask what a cheaper medication would be for him. I explained that we unfortunately do not know the prices of medications. Patient stated that he would do this and give me a call back.

## 2016-11-03 NOTE — Telephone Encounter (Signed)
Called and let patient know Cheryl's suggestions. Patient states that he will call me back with what he finds out regarding these two medications.

## 2016-11-03 NOTE — Telephone Encounter (Signed)
Patient called back. Call transferred to [place].

## 2016-11-03 NOTE — Telephone Encounter (Signed)
Patient called back. He stated that he spoke to [facility] and that they told him that the best option they could give would be for Malachy Mood to tell what medication she may prescribe next for an alternative for Advair. Patient states that he thinks he may need 2 inhalers instead of just one. Patient asked for me to call him back with Cheryl's different medication option and then he will call [employer] back and see what they say about that medication.

## 2016-11-03 NOTE — Telephone Encounter (Signed)
Patient called. Call transferred to [place].

## 2016-11-03 NOTE — Telephone Encounter (Signed)
Symbicort or Ruthe Mannan

## 2016-11-03 NOTE — Telephone Encounter (Signed)
Called and left patient a VM asking for him to please return my call.  

## 2016-11-09 DIAGNOSIS — J301 Allergic rhinitis due to pollen: Secondary | ICD-10-CM | POA: Diagnosis not present

## 2016-11-10 ENCOUNTER — Telehealth: Payer: Self-pay | Admitting: Unknown Physician Specialty

## 2016-11-10 MED ORDER — BUDESONIDE-FORMOTEROL FUMARATE 160-4.5 MCG/ACT IN AERO: 2.0000 | INHALATION_SPRAY | Freq: Two times a day (BID) | RESPIRATORY_TRACT | 3 refills | Status: DC

## 2016-11-10 NOTE — Telephone Encounter (Signed)
Called and left patient a VM (not detailed) letting him know that his prescription was sent in to the pharmacy for him.

## 2016-11-10 NOTE — Telephone Encounter (Signed)
See other phone note from today, 11/10/16.

## 2016-11-10 NOTE — Telephone Encounter (Signed)
Patient would like to get the prescription for cynbecorp [facility] due to the medication being more affordable.   Please Advise.  Thank you

## 2016-11-10 NOTE — Telephone Encounter (Signed)
Went up front and clarified medication with Jonelle Sidle, who took the message, about medication name. She was referring to Symbicort. Patient spoke with insurance and symbicort is a cheaper option than the advair (see other phone documentation). He would like the prescription sent to [facility].

## 2016-11-12 DIAGNOSIS — J301 Allergic rhinitis due to pollen: Secondary | ICD-10-CM | POA: Diagnosis not present

## 2016-11-16 DIAGNOSIS — H6123 Impacted cerumen, bilateral: Secondary | ICD-10-CM | POA: Diagnosis not present

## 2016-11-16 DIAGNOSIS — J301 Allergic rhinitis due to pollen: Secondary | ICD-10-CM | POA: Diagnosis not present

## 2016-11-19 DIAGNOSIS — J301 Allergic rhinitis due to pollen: Secondary | ICD-10-CM | POA: Diagnosis not present

## 2016-11-27 DIAGNOSIS — J301 Allergic rhinitis due to pollen: Secondary | ICD-10-CM | POA: Diagnosis not present

## 2016-12-07 DIAGNOSIS — J301 Allergic rhinitis due to pollen: Secondary | ICD-10-CM | POA: Diagnosis not present

## 2016-12-14 DIAGNOSIS — J301 Allergic rhinitis due to pollen: Secondary | ICD-10-CM | POA: Diagnosis not present

## 2016-12-21 ENCOUNTER — Other Ambulatory Visit: Payer: Self-pay | Admitting: Unknown Physician Specialty

## 2016-12-23 ENCOUNTER — Encounter: Payer: Self-pay | Admitting: Unknown Physician Specialty

## 2016-12-23 ENCOUNTER — Ambulatory Visit (INDEPENDENT_AMBULATORY_CARE_PROVIDER_SITE_OTHER): Payer: BLUE CROSS/BLUE SHIELD | Admitting: Unknown Physician Specialty

## 2016-12-23 DIAGNOSIS — E7800 Pure hypercholesterolemia, unspecified: Secondary | ICD-10-CM

## 2016-12-23 DIAGNOSIS — J45909 Unspecified asthma, uncomplicated: Secondary | ICD-10-CM

## 2016-12-23 DIAGNOSIS — I10 Essential (primary) hypertension: Secondary | ICD-10-CM

## 2016-12-23 MED ORDER — ADVAIR DISKUS 100-50 MCG/DOSE IN AEPB: 1.0000 | INHALATION_SPRAY | Freq: Two times a day (BID) | RESPIRATORY_TRACT | 3 refills | Status: DC

## 2016-12-23 MED ORDER — AMLODIPINE BESYLATE 5 MG PO TABS: 10.0000 mg | ORAL_TABLET | Freq: Every day | ORAL | 1 refills | Status: DC

## 2016-12-23 NOTE — Assessment & Plan Note (Signed)
ASCVD risk is 8.5%  Refusing statins at this time.

## 2016-12-23 NOTE — Assessment & Plan Note (Signed)
Stable, continue present medications.   

## 2016-12-23 NOTE — Progress Notes (Signed)
BP 137/89   Pulse 73   Temp 98.1 F (36.7 C)   Wt 195 lb (88.5 kg)   SpO2 98%   BMI 26.45 kg/m    Subjective:    Patient ID: Jonathan Roberson, male    DOB: 29-Apr-1956, 61 y.o.   MRN: [id number]  HPI: Jonathan Roberson is a 61 y.o. male  Chief Complaint  Patient presents with  . Depression  . Hypertension   Depression Depression under good control. Depression screen [facility] 2/9 12/23/2016 06/24/2016 10/29/2015 04/29/2015  Decreased Interest 1 0 0 2  Down, Depressed, Hopeless 1 1 0 1  PHQ - 2 Score 2 1 0 3  Altered sleeping 0 - - 2  Tired, decreased energy 1 - - 2  Change in appetite 0 - - 0  Feeling bad or failure about yourself  1 - - 0  Trouble concentrating 0 - - 1  Moving slowly or fidgety/restless 0 - - 1  Suicidal thoughts 0 - - 0  PHQ-9 Score 4 - - 9   Hypertension Using medications without difficulty Average home BPs Not checking   No problems or lightheadedness No chest pain with exertion or shortness of breath No Edema  Asthma Stable.  Wanting to get back on bike.  No need to use rscue  The 10-year ASCVD risk score [name] DC Brooke Bonito., et al., 2013) is: 8.5%   Values used to calculate the score:     Age: 32 years     Sex: Male     Is Non-Hispanic African American: No     Diabetic: No     Tobacco smoker: No     Systolic Blood Pressure: 546 mmHg     Is BP treated: Yes     HDL Cholesterol: 80 mg/dL     Total Cholesterol: 211 mg/dL   Social History   Social History  . Marital status: Married    Spouse name: N/A  . Number of children: N/A  . Years of education: N/A   Occupational History  . Not on file.   Social History Main Topics  . Smoking status: Never Smoker  . Smokeless tobacco: Never Used  . Alcohol use 0.0 oz/week     Comment: on occasion- about once a year  . Drug use: No  . Sexual activity: Yes   Other Topics Concern  . Not on file   Social History Narrative  . No narrative on file   Family History  Problem Relation Age of Onset  .  Heart disease Mother   . Depression Mother   . Arthritis Sister   . Emphysema Father   . Depression Daughter    Past Medical History:  Diagnosis Date  . Allergy   . Allergy to dust   . Arthritis    hands, hips, knees  . Asthma   . Depression   . Hyperlipidemia   . Hypertension    Past Surgical History:  Procedure Laterality Date  . BIOPSY N/A 08/14/2016   Procedure: BIOPSY;  Surgeon: Lucilla Lame, MD;  Location: [place];  Service: Endoscopy;  Laterality: N/A;  . COLONOSCOPY WITH PROPOFOL N/A 08/14/2016   Procedure: COLONOSCOPY WITH PROPOFOL;  Surgeon: Lucilla Lame, MD;  Location: [facility];  Service: Endoscopy;  Laterality: N/A;  . IMAGE GUIDED SINUS SURGERY Bilateral 09/24/2015   Procedure: IMAGE GUIDED SINUS SURGERY Bilateral nasal polypectomy,maxillary antrostomy with tissue removal,bilateral ethmoidectomy,frontal sinusotomy;  Surgeon: Clyde Canterbury, MD;  Location: [facility];  Service: ENT;  Laterality: Bilateral;  GAVE DISK TO CE CE 4-12 KP  . NASAL SINUS SURGERY  2005   Dr. Nadeen Landau, [facility]   Relevant past medical, surgical, family and social history reviewed and updated as indicated. Interim medical history since our last visit reviewed. Allergies and medications reviewed and updated.  Review of Systems  Per HPI unless specifically indicated above     Objective:    BP 137/89   Pulse 73   Temp 98.1 F (36.7 C)   Wt 195 lb (88.5 kg)   SpO2 98%   BMI 26.45 kg/m   Wt Readings from Last 3 Encounters:  12/23/16 195 lb (88.5 kg)  08/14/16 187 lb (84.8 kg)  06/24/16 194 lb 6.4 oz (88.2 kg)    Physical Exam  Constitutional: He is oriented to person, place, and time. He appears well-developed and well-nourished. No distress.  HENT:  Head: Normocephalic and atraumatic.  Eyes: Conjunctivae and lids are normal. Right eye exhibits no discharge. Left eye exhibits no discharge. No scleral icterus.  Neck: Normal range of motion. Neck supple.  No JVD present. Carotid bruit is not present.  Cardiovascular: Normal rate, regular rhythm and normal heart sounds.   Pulmonary/Chest: Effort normal and breath sounds normal. No respiratory distress.  Abdominal: Normal appearance. There is no splenomegaly or hepatomegaly.  Musculoskeletal: Normal range of motion.  Neurological: He is alert and oriented to person, place, and time.  Skin: Skin is warm, dry and intact. No rash noted. No pallor.  Psychiatric: He has a normal mood and affect. His behavior is normal. Judgment and thought content normal.    Results for orders placed or performed in visit on 06/24/16  Comprehensive metabolic panel  Result Value Ref Range   Glucose 109 (H) 65 - 99 mg/dL   BUN 9 8 - 27 mg/dL   Creatinine, Ser 0.63 (L) 0.76 - 1.27 mg/dL   GFR calc non Af Amer 107 >59 mL/min/1.73   GFR calc Af Amer 124 >59 mL/min/1.73   BUN/Creatinine Ratio 14 10 - 24   Sodium 135 134 - 144 mmol/L   Potassium 3.8 3.5 - 5.2 mmol/L   Chloride 93 (L) 96 - 106 mmol/L   CO2 26 18 - 29 mmol/L   Calcium 8.9 8.6 - 10.2 mg/dL   Total Protein 7.6 6.0 - 8.5 g/dL   Albumin 4.2 3.6 - 4.8 g/dL   Globulin, Total 3.4 1.5 - 4.5 g/dL   Albumin/Globulin Ratio 1.2 1.2 - 2.2   Bilirubin Total 0.6 0.0 - 1.2 mg/dL   Alkaline Phosphatase 63 39 - 117 IU/L   AST 24 0 - 40 IU/L   ALT 26 0 - 44 IU/L  TSH  Result Value Ref Range   TSH 0.686 0.450 - 4.500 uIU/mL  PSA  Result Value Ref Range   Prostate Specific Ag, Serum 2.2 0.0 - 4.0 ng/mL  CBC with Differential/Platelet  Result Value Ref Range   WBC 7.6 3.4 - 10.8 x10E3/uL   RBC 4.22 4.14 - 5.80 x10E6/uL   Hemoglobin 13.1 13.0 - 17.7 g/dL   Hematocrit 38.6 37.5 - 51.0 %   MCV 92 79 - 97 fL   MCH 31.0 26.6 - 33.0 pg   MCHC 33.9 31.5 - 35.7 g/dL   RDW 13.0 12.3 - 15.4 %   Platelets 222 150 - 379 x10E3/uL   Neutrophils 72 Not Estab. %   Lymphs 14 Not Estab. %   Monocytes 8 Not Estab. %  Eos 5 Not Estab. %   Basos 1 Not Estab. %    Neutrophils Absolute 5.5 1.4 - 7.0 x10E3/uL   Lymphocytes Absolute 1.1 0.7 - 3.1 x10E3/uL   Monocytes Absolute 0.6 0.1 - 0.9 x10E3/uL   EOS (ABSOLUTE) 0.4 0.0 - 0.4 x10E3/uL   Basophils Absolute 0.0 0.0 - 0.2 x10E3/uL   Immature Granulocytes 0 Not Estab. %   Immature Grans (Abs) 0.0 0.0 - 0.1 x10E3/uL  Lipid Panel w/o Chol/HDL Ratio  Result Value Ref Range   Cholesterol, Total 211 (H) 100 - 199 mg/dL   Triglycerides 166 (H) 0 - 149 mg/dL   HDL 80 >39 mg/dL   VLDL Cholesterol Cal 33 5 - 40 mg/dL   LDL Calculated 98 0 - 99 mg/dL                       Assessment & Plan:   Problem List Items Addressed This Visit      Unprioritized   Asthma    Stable, continue present medications.        Relevant Medications   ADVAIR DISKUS 100-50 MCG/DOSE AEPB   Hyperlipidemia    ASCVD risk is 8.5%  Refusing statins at this time.        Relevant Medications   amLODipine (NORVASC) 5 MG tablet   Hypertension    BP not quite to goal.  Pt states he is walking and thinks it will improve      Relevant Medications   amLODipine (NORVASC) 5 MG tablet       Follow up plan: Return in about 6 months (around 06/25/2017).

## 2016-12-23 NOTE — Assessment & Plan Note (Signed)
BP not quite to goal.  Pt states he is walking and thinks it will improve

## 2016-12-24 DIAGNOSIS — J301 Allergic rhinitis due to pollen: Secondary | ICD-10-CM | POA: Diagnosis not present

## 2016-12-28 DIAGNOSIS — J301 Allergic rhinitis due to pollen: Secondary | ICD-10-CM | POA: Diagnosis not present

## 2017-01-07 DIAGNOSIS — J301 Allergic rhinitis due to pollen: Secondary | ICD-10-CM | POA: Diagnosis not present

## 2017-01-11 DIAGNOSIS — J301 Allergic rhinitis due to pollen: Secondary | ICD-10-CM | POA: Diagnosis not present

## 2017-01-14 DIAGNOSIS — J301 Allergic rhinitis due to pollen: Secondary | ICD-10-CM | POA: Diagnosis not present

## 2017-01-27 DIAGNOSIS — J301 Allergic rhinitis due to pollen: Secondary | ICD-10-CM | POA: Diagnosis not present

## 2017-02-08 DIAGNOSIS — J301 Allergic rhinitis due to pollen: Secondary | ICD-10-CM | POA: Diagnosis not present

## 2017-02-15 DIAGNOSIS — J301 Allergic rhinitis due to pollen: Secondary | ICD-10-CM | POA: Diagnosis not present

## 2017-02-22 DIAGNOSIS — J301 Allergic rhinitis due to pollen: Secondary | ICD-10-CM | POA: Diagnosis not present

## 2017-03-08 ENCOUNTER — Ambulatory Visit (INDEPENDENT_AMBULATORY_CARE_PROVIDER_SITE_OTHER): Payer: BLUE CROSS/BLUE SHIELD | Admitting: Unknown Physician Specialty

## 2017-03-08 ENCOUNTER — Encounter: Payer: Self-pay | Admitting: Unknown Physician Specialty

## 2017-03-08 ENCOUNTER — Ambulatory Visit
Admission: RE | Admit: 2017-03-08 | Discharge: 2017-03-08 | Disposition: A | Discharge status: Home / Self Care | Payer: BLUE CROSS/BLUE SHIELD | Source: Ambulatory Visit | Attending: Unknown Physician Specialty | Admitting: Unknown Physician Specialty

## 2017-03-08 VITALS — BP 142/91 | HR 73 | Temp 98.4°F | Wt 193.0 lb

## 2017-03-08 DIAGNOSIS — J45909 Unspecified asthma, uncomplicated: Secondary | ICD-10-CM

## 2017-03-08 DIAGNOSIS — J9811 Atelectasis: Secondary | ICD-10-CM | POA: Insufficient documentation

## 2017-03-08 DIAGNOSIS — R0602 Shortness of breath: Secondary | ICD-10-CM

## 2017-03-08 MED ORDER — ALBUTEROL SULFATE HFA 108 (90 BASE) MCG/ACT IN AERS: 2.0000 | INHALATION_SPRAY | Freq: Four times a day (QID) | RESPIRATORY_TRACT | 1 refills | Status: DC | PRN

## 2017-03-08 MED ORDER — ALBUTEROL SULFATE (2.5 MG/3ML) 0.083% IN NEBU
2.5000 mg | INHALATION_SOLUTION | Freq: Once | RESPIRATORY_TRACT | Status: AC
  Administered 2017-03-08: 2.5 mg via RESPIRATORY_TRACT

## 2017-03-08 NOTE — Progress Notes (Signed)
BP (!) 142/91   Pulse 73   Temp 98.4 F (36.9 C)   Wt 193 lb (87.5 kg)   SpO2 92%   BMI 26.18 kg/m    Subjective:    Patient ID: Jonathan Roberson, male    DOB: 03-26-1956, 61 y.o.   MRN: [id number]  HPI: Jonathan Roberson is a 61 y.o. male  Chief Complaint  Patient presents with  . Bronchitis    pt states he thinks he has bronchitis, states his chest has been rattely for about a week   . Immunizations    pt interested in shingles vaccine   SOB/Asthma Pt states he is having some congestion.  State he is "not really" SOB.  He started taking Advair but only for 2 days.  States today he went back to Proventil. No fever.     Relevant past medical, surgical, family and social history reviewed and updated as indicated. Interim medical history since our last visit reviewed. Allergies and medications reviewed and updated.  Review of Systems  Per HPI unless specifically indicated above     Objective:    BP (!) 142/91   Pulse 73   Temp 98.4 F (36.9 C)   Wt 193 lb (87.5 kg)   SpO2 92%   BMI 26.18 kg/m   Wt Readings from Last 3 Encounters:  03/08/17 193 lb (87.5 kg)  12/23/16 195 lb (88.5 kg)  08/14/16 187 lb (84.8 kg)    Physical Exam  Constitutional: He is oriented to person, place, and time. He appears well-developed and well-nourished. No distress.  HENT:  Head: Normocephalic and atraumatic.  Eyes: Conjunctivae and lids are normal. Right eye exhibits no discharge. Left eye exhibits no discharge. No scleral icterus.  Neck: Normal range of motion. Neck supple. No JVD present. Carotid bruit is not present.  Cardiovascular: Normal rate, regular rhythm and normal heart sounds.   Pulmonary/Chest: No respiratory distress. He has no decreased breath sounds. He has no wheezes. He has rhonchi in the left middle field. He has no rales.  Abdominal: Normal appearance. There is no splenomegaly or hepatomegaly.  Musculoskeletal: Normal range of motion.  Neurological: He is alert and  oriented to person, place, and time.  Skin: Skin is warm, dry and intact. No rash noted. No pallor.  Psychiatric: He has a normal mood and affect. His behavior is normal. Judgment and thought content normal.    Results for orders placed or performed in visit on 06/24/16  Comprehensive metabolic panel  Result Value Ref Range   Glucose 109 (H) 65 - 99 mg/dL   BUN 9 8 - 27 mg/dL   Creatinine, Ser 0.63 (L) 0.76 - 1.27 mg/dL   GFR calc non Af Amer 107 >59 mL/min/1.73   GFR calc Af Amer 124 >59 mL/min/1.73   BUN/Creatinine Ratio 14 10 - 24   Sodium 135 134 - 144 mmol/L   Potassium 3.8 3.5 - 5.2 mmol/L   Chloride 93 (L) 96 - 106 mmol/L   CO2 26 18 - 29 mmol/L   Calcium 8.9 8.6 - 10.2 mg/dL   Total Protein 7.6 6.0 - 8.5 g/dL   Albumin 4.2 3.6 - 4.8 g/dL   Globulin, Total 3.4 1.5 - 4.5 g/dL   Albumin/Globulin Ratio 1.2 1.2 - 2.2   Bilirubin Total 0.6 0.0 - 1.2 mg/dL   Alkaline Phosphatase 63 39 - 117 IU/L   AST 24 0 - 40 IU/L   ALT 26 0 - 44 IU/L  TSH  Result Value Ref Range   TSH 0.686 0.450 - 4.500 uIU/mL  PSA  Result Value Ref Range   Prostate Specific Ag, Serum 2.2 0.0 - 4.0 ng/mL  CBC with Differential/Platelet  Result Value Ref Range   WBC 7.6 3.4 - 10.8 x10E3/uL   RBC 4.22 4.14 - 5.80 x10E6/uL   Hemoglobin 13.1 13.0 - 17.7 g/dL   Hematocrit 38.6 37.5 - 51.0 %   MCV 92 79 - 97 fL   MCH 31.0 26.6 - 33.0 pg   MCHC 33.9 31.5 - 35.7 g/dL   RDW 13.0 12.3 - 15.4 %   Platelets 222 150 - 379 x10E3/uL   Neutrophils 72 Not Estab. %   Lymphs 14 Not Estab. %   Monocytes 8 Not Estab. %   Eos 5 Not Estab. %   Basos 1 Not Estab. %   Neutrophils Absolute 5.5 1.4 - 7.0 x10E3/uL   Lymphocytes Absolute 1.1 0.7 - 3.1 x10E3/uL   Monocytes Absolute 0.6 0.1 - 0.9 x10E3/uL   EOS (ABSOLUTE) 0.4 0.0 - 0.4 x10E3/uL   Basophils Absolute 0.0 0.0 - 0.2 x10E3/uL   Immature Granulocytes 0 Not Estab. %   Immature Grans (Abs) 0.0 0.0 - 0.1 x10E3/uL  Lipid Panel w/o Chol/HDL Ratio  Result Value  Ref Range   Cholesterol, Total 211 (H) 100 - 199 mg/dL   Triglycerides 166 (H) 0 - 149 mg/dL   HDL 80 >39 mg/dL   VLDL Cholesterol Cal 33 5 - 40 mg/dL   LDL Calculated 98 0 - 99 mg/dL      Assessment & Plan:   Problem List Items Addressed This Visit      Unprioritized   Asthma    Pt with a flare.  Get chest x-ray due to rhonchi left upper airways.  Encouraged to take Advair for at least 2 weeks with Proventil prn      Relevant Medications   albuterol (PROVENTIL) (2.5 MG/3ML) 0.083% nebulizer solution 2.5 mg (Completed)   albuterol (PROVENTIL HFA;VENTOLIN HFA) 108 (90 Base) MCG/ACT inhaler    Other Visit Diagnoses    SOB (shortness of breath)    -  Primary   Relevant Medications   albuterol (PROVENTIL) (2.5 MG/3ML) 0.083% nebulizer solution 2.5 mg (Completed)   Other Relevant Orders   DG Chest 2 View       Follow up plan: Return if symptoms worsen or fail to improve.

## 2017-03-08 NOTE — Assessment & Plan Note (Signed)
Pt with a flare.  Get chest x-ray due to rhonchi left upper airways.  Encouraged to take Advair for at least 2 weeks with Proventil prn

## 2017-03-16 DIAGNOSIS — J301 Allergic rhinitis due to pollen: Secondary | ICD-10-CM | POA: Diagnosis not present

## 2017-03-25 DIAGNOSIS — J301 Allergic rhinitis due to pollen: Secondary | ICD-10-CM | POA: Diagnosis not present

## 2017-03-29 DIAGNOSIS — J301 Allergic rhinitis due to pollen: Secondary | ICD-10-CM | POA: Diagnosis not present

## 2017-04-08 DIAGNOSIS — J301 Allergic rhinitis due to pollen: Secondary | ICD-10-CM | POA: Diagnosis not present

## 2017-04-12 DIAGNOSIS — J301 Allergic rhinitis due to pollen: Secondary | ICD-10-CM | POA: Diagnosis not present

## 2017-04-19 DIAGNOSIS — J301 Allergic rhinitis due to pollen: Secondary | ICD-10-CM | POA: Diagnosis not present

## 2017-04-21 DIAGNOSIS — J301 Allergic rhinitis due to pollen: Secondary | ICD-10-CM | POA: Diagnosis not present

## 2017-04-25 ENCOUNTER — Other Ambulatory Visit: Payer: Self-pay | Admitting: Family Medicine

## 2017-04-26 NOTE — Telephone Encounter (Signed)
Your patient 

## 2017-05-01 ENCOUNTER — Other Ambulatory Visit: Payer: Self-pay | Admitting: Unknown Physician Specialty

## 2017-05-03 ENCOUNTER — Encounter: Payer: Self-pay | Admitting: Unknown Physician Specialty

## 2017-05-03 ENCOUNTER — Ambulatory Visit (INDEPENDENT_AMBULATORY_CARE_PROVIDER_SITE_OTHER): Payer: BLUE CROSS/BLUE SHIELD | Admitting: Unknown Physician Specialty

## 2017-05-03 VITALS — BP 138/83 | HR 83 | Temp 98.3°F | Wt 194.0 lb

## 2017-05-03 DIAGNOSIS — R059 Cough, unspecified: Secondary | ICD-10-CM

## 2017-05-03 DIAGNOSIS — J4541 Moderate persistent asthma with (acute) exacerbation: Secondary | ICD-10-CM

## 2017-05-03 DIAGNOSIS — R05 Cough: Secondary | ICD-10-CM | POA: Diagnosis not present

## 2017-05-03 MED ORDER — ALBUTEROL SULFATE (2.5 MG/3ML) 0.083% IN NEBU: 2.5000 mg | INHALATION_SOLUTION | Freq: Four times a day (QID) | RESPIRATORY_TRACT | 1 refills | Status: DC | PRN

## 2017-05-03 NOTE — Assessment & Plan Note (Addendum)
Seems to be improving.  Continue Advair BID and Albuterol prn.  Wondering about getting a nebulzer when he gets sick. OK.  Will prescribe neb medications at pt request.  Pt ed that either can be used interchangably but not together or within 4 hours of each other

## 2017-05-03 NOTE — Progress Notes (Signed)
BP 138/83   Pulse 83   Temp 98.3 F (36.8 C) (Oral)   Wt 194 lb (88 kg)   SpO2 92%   BMI 26.31 kg/m    Subjective:    Patient ID: Jonathan Roberson, male    DOB: 24-Dec-1955, 61 y.o.   MRN: [id number]  HPI: Jonathan Roberson is a 61 y.o. male  Chief Complaint  Patient presents with  . Orders    pt states he would like to have a breathing treatment for a chest cold he is getting over. Patient states that he would also like a nebulizer to have at home so he can use when he gets chest colds  . Medication Assistance    pt would like to know suggestions on supplements to take to help improve lung function    Cough  This is a new (The last week and 1/2) problem. The problem has been gradually improving. The cough is productive of sputum. Associated symptoms include headaches. Pertinent negatives include no chest pain, chills, ear congestion, ear pain, fever, heartburn, hemoptysis, myalgias, nasal congestion, postnasal drip, rash, rhinorrhea, sore throat, shortness of breath, sweats, weight loss or wheezing. Nothing aggravates the symptoms. He has tried a beta-agonist inhaler (Advair) for the symptoms. The treatment provided no relief. His past medical history is significant for asthma.   Relevant past medical, surgical, family and social history reviewed and updated as indicated. Interim medical history since our last visit reviewed. Allergies and medications reviewed and updated.  Social History   Socioeconomic History  . Marital status: Married    Spouse name: Not on file  . Number of children: Not on file  . Years of education: Not on file  . Highest education level: Not on file  Social Needs  . Financial resource strain: Not on file  . Food insecurity - worry: Not on file  . Food insecurity - inability: Not on file  . Transportation needs - medical: Not on file  . Transportation needs - non-medical: Not on file  Occupational History  . Not on file  Tobacco Use  . Smoking  status: Never Smoker  . Smokeless tobacco: Never Used  Substance and Sexual Activity  . Alcohol use: Yes    Alcohol/week: 0.0 oz    Comment: on occasion- about once a year  . Drug use: No  . Sexual activity: Yes  Other Topics Concern  . Not on file  Social History Narrative  . Not on file    Review of Systems  Constitutional: Negative for chills, fever and weight loss.  HENT: Negative for ear pain, postnasal drip, rhinorrhea and sore throat.   Respiratory: Positive for cough. Negative for hemoptysis, shortness of breath and wheezing.   Cardiovascular: Negative for chest pain.  Gastrointestinal: Negative for heartburn.  Musculoskeletal: Negative for myalgias.  Skin: Negative for rash.  Neurological: Positive for headaches.    Per HPI unless specifically indicated above     Objective:    BP 138/83   Pulse 83   Temp 98.3 F (36.8 C) (Oral)   Wt 194 lb (88 kg)   SpO2 92%   BMI 26.31 kg/m   Wt Readings from Last 3 Encounters:  05/03/17 194 lb (88 kg)  03/08/17 193 lb (87.5 kg)  12/23/16 195 lb (88.5 kg)    Physical Exam  Constitutional: He is oriented to person, place, and time. He appears well-developed and well-nourished. No distress.  HENT:  Head: Normocephalic and atraumatic.  Eyes: Conjunctivae  and lids are normal. Right eye exhibits no discharge. Left eye exhibits no discharge. No scleral icterus.  Neck: Normal range of motion. Neck supple. No JVD present. Carotid bruit is not present.  Cardiovascular: Normal rate, regular rhythm and normal heart sounds.  Pulmonary/Chest: Effort normal and breath sounds normal. No respiratory distress.  Abdominal: Normal appearance. There is no splenomegaly or hepatomegaly.  Musculoskeletal: Normal range of motion.  Neurological: He is alert and oriented to person, place, and time.  Skin: Skin is warm, dry and intact. No rash noted. No pallor.  Psychiatric: He has a normal mood and affect. His behavior is normal. Judgment and  thought content normal.      Assessment & Plan:   Problem List Items Addressed This Visit      Unprioritized   Asthma - Primary    Seems to be improving.  Continue Advair BID and Albuterol prn.  Wondering about getting a nebulzer when he gets sick. OK.  Will prescribe neb medications at pt request.  Pt ed that either can be used interchangably but not together or within 4 hours of each other      Relevant Medications   albuterol (PROVENTIL) (2.5 MG/3ML) 0.083% nebulizer solution    Other Visit Diagnoses    Cough       Discussed cough medications.  he states he is fine with OTC       Follow up plan: Return if symptoms worsen or fail to improve.

## 2017-05-06 DIAGNOSIS — J301 Allergic rhinitis due to pollen: Secondary | ICD-10-CM | POA: Diagnosis not present

## 2017-05-17 DIAGNOSIS — J301 Allergic rhinitis due to pollen: Secondary | ICD-10-CM | POA: Diagnosis not present

## 2017-05-27 DIAGNOSIS — J301 Allergic rhinitis due to pollen: Secondary | ICD-10-CM | POA: Diagnosis not present

## 2017-06-02 DIAGNOSIS — J301 Allergic rhinitis due to pollen: Secondary | ICD-10-CM | POA: Diagnosis not present

## 2017-06-18 DIAGNOSIS — J301 Allergic rhinitis due to pollen: Secondary | ICD-10-CM | POA: Diagnosis not present

## 2017-06-28 DIAGNOSIS — J301 Allergic rhinitis due to pollen: Secondary | ICD-10-CM | POA: Diagnosis not present

## 2017-06-29 ENCOUNTER — Encounter: Payer: Self-pay | Admitting: Unknown Physician Specialty

## 2017-06-29 ENCOUNTER — Ambulatory Visit (INDEPENDENT_AMBULATORY_CARE_PROVIDER_SITE_OTHER): Payer: BLUE CROSS/BLUE SHIELD | Admitting: Unknown Physician Specialty

## 2017-06-29 VITALS — BP 151/92 | HR 63 | Temp 97.8°F | Ht 71.8 in | Wt 190.6 lb

## 2017-06-29 DIAGNOSIS — Z0000 Encounter for general adult medical examination without abnormal findings: Secondary | ICD-10-CM

## 2017-06-29 DIAGNOSIS — I10 Essential (primary) hypertension: Secondary | ICD-10-CM | POA: Diagnosis not present

## 2017-06-29 DIAGNOSIS — E7800 Pure hypercholesterolemia, unspecified: Secondary | ICD-10-CM

## 2017-06-29 DIAGNOSIS — J45909 Unspecified asthma, uncomplicated: Secondary | ICD-10-CM

## 2017-06-29 NOTE — Assessment & Plan Note (Signed)
Stable, continue present medications.   

## 2017-06-29 NOTE — Assessment & Plan Note (Addendum)
Not to goal but did not take meds today.  Will check at home.  Continue present medications.

## 2017-06-29 NOTE — Progress Notes (Signed)
BP (!) 151/92   Pulse 63   Temp 97.8 F (36.6 C) (Oral)   Ht 5' 11.8" (1.824 m)   Wt 190 lb 9.6 oz (86.5 kg)   SpO2 98%   BMI 25.99 kg/m    Subjective:    Patient ID: Jonathan Roberson, male    DOB: September 01, 1955, 62 y.o.   MRN: [id number]  HPI: Jonathan Roberson is a 62 y.o. male  Chief Complaint  Patient presents with  . Annual Exam   ASTHMA Symptoms are well controlled Using medications without problems Night time symptoms: none Wheeze/SOB: Much better after last illness ER visits since last visit:none  Hypertension Using medications without difficulty/but did not take meds this am Average home BPs    No problems or lightheadedness No chest pain with exertion or shortness of breath No Edema   Hyperlipidemia Using medications without problems: No Muscle aches  Diet compliance: Eats well Exercise:Walks daily  Social History   Socioeconomic History  . Marital status: Married    Spouse name: Not on file  . Number of children: Not on file  . Years of education: Not on file  . Highest education level: Not on file  Social Needs  . Financial resource strain: Not on file  . Food insecurity - worry: Not on file  . Food insecurity - inability: Not on file  . Transportation needs - medical: Not on file  . Transportation needs - non-medical: Not on file  Occupational History  . Not on file  Tobacco Use  . Smoking status: Never Smoker  . Smokeless tobacco: Never Used  Substance and Sexual Activity  . Alcohol use: Yes    Alcohol/week: 0.0 oz    Comment: on occasion- about once a year  . Drug use: No  . Sexual activity: Yes  Other Topics Concern  . Not on file  Social History Narrative  . Not on file   Family History  Problem Relation Age of Onset  . Heart disease Mother   . Depression Mother   . Arthritis Sister   . Emphysema Father   . Depression Daughter    Past Medical History:  Diagnosis Date  . Allergy   . Allergy to dust   . Arthritis    hands,  hips, knees  . Asthma   . Depression   . Hyperlipidemia   . Hypertension    Past Surgical History:  Procedure Laterality Date  . BIOPSY N/A 08/14/2016   Procedure: BIOPSY;  Surgeon: Lucilla Lame, MD;  Location: [facility];  Service: Endoscopy;  Laterality: N/A;  . COLONOSCOPY WITH PROPOFOL N/A 08/14/2016   Procedure: COLONOSCOPY WITH PROPOFOL;  Surgeon: Lucilla Lame, MD;  Location: [place];  Service: Endoscopy;  Laterality: N/A;  . IMAGE GUIDED SINUS SURGERY Bilateral 09/24/2015   Procedure: IMAGE GUIDED SINUS SURGERY Bilateral nasal polypectomy,maxillary antrostomy with tissue removal,bilateral ethmoidectomy,frontal sinusotomy;  Surgeon: Clyde Canterbury, MD;  Location: [place];  Service: ENT;  Laterality: Bilateral;  GAVE DISK TO CE CE 4-12 KP  . NASAL SINUS SURGERY  2005   Dr. Nadeen Landau, [facility]     Relevant past medical, surgical, family and social history reviewed and updated as indicated. Interim medical history since our last visit reviewed. Allergies and medications reviewed and updated.  Review of Systems  Per HPI unless specifically indicated above     Objective:    BP (!) 151/92   Pulse 63   Temp 97.8 F (36.6 C) (Oral)  Ht 5' 11.8" (1.824 m)   Wt 190 lb 9.6 oz (86.5 kg)   SpO2 98%   BMI 25.99 kg/m   Wt Readings from Last 3 Encounters:  06/29/17 190 lb 9.6 oz (86.5 kg)  05/03/17 194 lb (88 kg)  03/08/17 193 lb (87.5 kg)    Physical Exam  Constitutional: He is oriented to person, place, and time. He appears well-developed and well-nourished.  HENT:  Head: Normocephalic.  Right Ear: Tympanic membrane, external ear and ear canal normal.  Left Ear: Tympanic membrane, external ear and ear canal normal.  Mouth/Throat: Uvula is midline, oropharynx is clear and moist and mucous membranes are normal.  Eyes: Pupils are equal, round, and reactive to light.  Cardiovascular: Normal rate, regular rhythm and normal heart sounds. Exam reveals no  gallop and no friction rub.  No murmur heard. Pulmonary/Chest: Effort normal and breath sounds normal. No respiratory distress.  Abdominal: Soft. Bowel sounds are normal. He exhibits no distension. There is no tenderness.  Musculoskeletal: Normal range of motion.  Neurological: He is alert and oriented to person, place, and time. He has normal reflexes.  Skin: Skin is warm and dry.  Psychiatric: He has a normal mood and affect. His behavior is normal. Judgment and thought content normal.   --+    Assessment & Plan:   Problem List Items Addressed This Visit      Unprioritized   Asthma    Stable, continue present medications.        Hyperlipidemia    Stable, continue present medications.        Relevant Orders   Lipid Panel w/o Chol/HDL Ratio   Hypertension    Not to goal but did not take meds today.  Will check at home.  Continue present medications.        Relevant Orders   Comprehensive metabolic panel    Other Visit Diagnoses    Annual physical exam    -  Primary   Relevant Orders   CBC with Differential/Platelet   TSH   PSA       Follow up plan: Return in about 6 months (around 12/27/2017).

## 2017-06-30 ENCOUNTER — Encounter: Payer: Self-pay | Admitting: Unknown Physician Specialty

## 2017-06-30 LAB — CBC WITH DIFFERENTIAL/PLATELET
BASOS ABS: 0 10*3/uL (ref 0.0–0.2)
Basos: 1 %
EOS (ABSOLUTE): 0.4 10*3/uL (ref 0.0–0.4)
EOS: 10 %
HEMATOCRIT: 38.7 % (ref 37.5–51.0)
Hemoglobin: 13 g/dL (ref 13.0–17.7)
IMMATURE GRANS (ABS): 0 10*3/uL (ref 0.0–0.1)
IMMATURE GRANULOCYTES: 0 %
LYMPHS ABS: 1.3 10*3/uL (ref 0.7–3.1)
LYMPHS: 33 %
MCH: 31 pg (ref 26.6–33.0)
MCHC: 33.6 g/dL (ref 31.5–35.7)
MCV: 92 fL (ref 79–97)
MONOCYTES: 10 %
Monocytes Absolute: 0.4 10*3/uL (ref 0.1–0.9)
Neutrophils Absolute: 1.8 10*3/uL (ref 1.4–7.0)
Neutrophils: 46 %
Platelets: 191 10*3/uL (ref 150–379)
RBC: 4.19 x10E6/uL (ref 4.14–5.80)
RDW: 13.5 % (ref 12.3–15.4)
WBC: 3.9 10*3/uL (ref 3.4–10.8)

## 2017-06-30 LAB — COMPREHENSIVE METABOLIC PANEL
ALK PHOS: 66 IU/L (ref 39–117)
ALT: 21 IU/L (ref 0–44)
AST: 21 IU/L (ref 0–40)
Albumin/Globulin Ratio: 1.3 (ref 1.2–2.2)
Albumin: 4.2 g/dL (ref 3.6–4.8)
BUN/Creatinine Ratio: 16 (ref 10–24)
BUN: 13 mg/dL (ref 8–27)
Bilirubin Total: 0.7 mg/dL (ref 0.0–1.2)
CHLORIDE: 96 mmol/L (ref 96–106)
CO2: 24 mmol/L (ref 20–29)
CREATININE: 0.83 mg/dL (ref 0.76–1.27)
Calcium: 8.8 mg/dL (ref 8.6–10.2)
GFR calc Af Amer: 110 mL/min/{1.73_m2} (ref >59)
GFR calc non Af Amer: 95 mL/min/{1.73_m2} (ref >59)
GLUCOSE: 82 mg/dL (ref 65–99)
Globulin, Total: 3.2 g/dL (ref 1.5–4.5)
Potassium: 3.9 mmol/L (ref 3.5–5.2)
Sodium: 139 mmol/L (ref 134–144)
TOTAL PROTEIN: 7.4 g/dL (ref 6.0–8.5)

## 2017-06-30 LAB — LIPID PANEL W/O CHOL/HDL RATIO
CHOLESTEROL TOTAL: 192 mg/dL (ref 100–199)
HDL: 74 mg/dL (ref >39)
LDL Calculated: 93 mg/dL (ref 0–99)
Triglycerides: 125 mg/dL (ref 0–149)
VLDL CHOLESTEROL CAL: 25 mg/dL (ref 5–40)

## 2017-06-30 LAB — PSA: Prostate Specific Ag, Serum: 2.6 ng/mL (ref 0.0–4.0)

## 2017-06-30 LAB — TSH: TSH: 0.947 u[IU]/mL (ref 0.450–4.500)

## 2017-07-12 DIAGNOSIS — J301 Allergic rhinitis due to pollen: Secondary | ICD-10-CM | POA: Diagnosis not present

## 2017-07-16 DIAGNOSIS — J301 Allergic rhinitis due to pollen: Secondary | ICD-10-CM | POA: Diagnosis not present

## 2017-07-19 DIAGNOSIS — J301 Allergic rhinitis due to pollen: Secondary | ICD-10-CM | POA: Diagnosis not present

## 2017-08-05 DIAGNOSIS — J301 Allergic rhinitis due to pollen: Secondary | ICD-10-CM | POA: Diagnosis not present

## 2017-08-10 DIAGNOSIS — J301 Allergic rhinitis due to pollen: Secondary | ICD-10-CM | POA: Diagnosis not present

## 2017-08-16 DIAGNOSIS — J301 Allergic rhinitis due to pollen: Secondary | ICD-10-CM | POA: Diagnosis not present

## 2017-08-23 DIAGNOSIS — J301 Allergic rhinitis due to pollen: Secondary | ICD-10-CM | POA: Diagnosis not present

## 2017-08-30 DIAGNOSIS — J301 Allergic rhinitis due to pollen: Secondary | ICD-10-CM | POA: Diagnosis not present

## 2017-09-06 DIAGNOSIS — J301 Allergic rhinitis due to pollen: Secondary | ICD-10-CM | POA: Diagnosis not present

## 2017-09-13 DIAGNOSIS — J301 Allergic rhinitis due to pollen: Secondary | ICD-10-CM | POA: Diagnosis not present

## 2017-09-20 DIAGNOSIS — J301 Allergic rhinitis due to pollen: Secondary | ICD-10-CM | POA: Diagnosis not present

## 2017-10-05 DIAGNOSIS — J301 Allergic rhinitis due to pollen: Secondary | ICD-10-CM | POA: Diagnosis not present

## 2017-10-11 DIAGNOSIS — J301 Allergic rhinitis due to pollen: Secondary | ICD-10-CM | POA: Diagnosis not present

## 2017-10-13 ENCOUNTER — Telehealth: Payer: Self-pay | Admitting: Unknown Physician Specialty

## 2017-10-13 MED ORDER — ADVAIR DISKUS 100-50 MCG/DOSE IN AEPB: 1.0000 | INHALATION_SPRAY | Freq: Two times a day (BID) | RESPIRATORY_TRACT | 3 refills | Status: DC

## 2017-10-13 MED ORDER — PAROXETINE HCL 20 MG PO TABS: 20.0000 mg | ORAL_TABLET | Freq: Every day | ORAL | 0 refills | Status: DC

## 2017-10-13 MED ORDER — AMLODIPINE BESYLATE 5 MG PO TABS: 10.0000 mg | ORAL_TABLET | Freq: Every day | ORAL | 0 refills | Status: DC

## 2017-10-13 NOTE — Telephone Encounter (Signed)
Copied from [place] [id number]. Topic: Quick Communication - Rx Refill/Question >> Oct 13, 2017  4:54 PM Arletha Grippe wrote: Medication: amLODipine (NORVASC) 5 MG tablet , ADVAIR DISKUS 100-50 MCG/DOSE AEPB, PARoxetine (PAXIL) 20 MG tablet, lisinopril Has the patient contacted their pharmacy? Yes.   (Agent: If no, request that the patient contact the pharmacy for the refill.) Preferred Pharmacy (with phone number [facility] street [facility]):[facility]  Agent: Please be advised that RX refills may take up to 3 business days. We ask that you follow-up with your pharmacy.

## 2017-10-13 NOTE — Telephone Encounter (Signed)
Pt requesting a refill of Lisinopril, but not listed on pt's current medication list.  LOV: 06/29/17 Jonathan Roberson

## 2017-10-14 MED ORDER — LISINOPRIL 10 MG PO TABS: 10.0000 mg | ORAL_TABLET | Freq: Every day | ORAL | 3 refills | Status: DC

## 2017-10-14 NOTE — Telephone Encounter (Signed)
Looks like it was taken off at a f/u visit.  OK to continue.  Will refill

## 2017-10-14 NOTE — Telephone Encounter (Signed)
LVM letting pt know that [facility]

## 2017-10-28 DIAGNOSIS — J301 Allergic rhinitis due to pollen: Secondary | ICD-10-CM | POA: Diagnosis not present

## 2017-11-01 DIAGNOSIS — J301 Allergic rhinitis due to pollen: Secondary | ICD-10-CM | POA: Diagnosis not present

## 2017-11-08 DIAGNOSIS — J301 Allergic rhinitis due to pollen: Secondary | ICD-10-CM | POA: Diagnosis not present

## 2017-11-17 DIAGNOSIS — J4520 Mild intermittent asthma, uncomplicated: Secondary | ICD-10-CM | POA: Diagnosis not present

## 2017-11-17 DIAGNOSIS — J301 Allergic rhinitis due to pollen: Secondary | ICD-10-CM | POA: Diagnosis not present

## 2017-11-18 DIAGNOSIS — J301 Allergic rhinitis due to pollen: Secondary | ICD-10-CM | POA: Diagnosis not present

## 2017-11-25 DIAGNOSIS — J301 Allergic rhinitis due to pollen: Secondary | ICD-10-CM | POA: Diagnosis not present

## 2017-12-06 DIAGNOSIS — J301 Allergic rhinitis due to pollen: Secondary | ICD-10-CM | POA: Diagnosis not present

## 2017-12-13 DIAGNOSIS — J301 Allergic rhinitis due to pollen: Secondary | ICD-10-CM | POA: Diagnosis not present

## 2017-12-21 ENCOUNTER — Encounter: Payer: Self-pay | Admitting: Unknown Physician Specialty

## 2017-12-21 ENCOUNTER — Ambulatory Visit (INDEPENDENT_AMBULATORY_CARE_PROVIDER_SITE_OTHER): Payer: BLUE CROSS/BLUE SHIELD | Admitting: Unknown Physician Specialty

## 2017-12-21 DIAGNOSIS — E7800 Pure hypercholesterolemia, unspecified: Secondary | ICD-10-CM | POA: Diagnosis not present

## 2017-12-21 DIAGNOSIS — I10 Essential (primary) hypertension: Secondary | ICD-10-CM

## 2017-12-21 DIAGNOSIS — J45909 Unspecified asthma, uncomplicated: Secondary | ICD-10-CM

## 2017-12-21 DIAGNOSIS — F3342 Major depressive disorder, recurrent, in full remission: Secondary | ICD-10-CM | POA: Diagnosis not present

## 2017-12-21 MED ORDER — LISINOPRIL 20 MG PO TABS: 20.0000 mg | ORAL_TABLET | Freq: Every day | ORAL | 0 refills | Status: DC

## 2017-12-21 NOTE — Progress Notes (Signed)
BP (!) 138/94 (BP Location: Right Arm, Cuff Size: Normal)   Pulse (!) 58   Temp (!) 97.5 F (36.4 C) (Oral)   Ht 5' 11.8" (1.824 m)   Wt 194 lb 8 oz (88.2 kg)   SpO2 98%   BMI 26.53 kg/m    Subjective:    Patient ID: Jonathan Roberson, male    DOB: February 10, 1956, 62 y.o.   MRN: [id number]  HPI: Jonathan Roberson is a 62 y.o. male  Chief Complaint  Patient presents with  . Hyperlipidemia  . Hypertension   Hypertension Using medications without difficulty Average home BPs Pt states his BP at home is typically a little high about 140-150/91-92    No problems or lightheadedness No chest pain with exertion or shortness of breath No Edema  Hyperlipidemia Using medications without problems: No Muscle aches  Diet compliance:Exercise: Planning on semi-retiring and planning on taking care of himself better.    ASTHMA Symptoms are well controlled Using medications without problems Night time symptoms: none Wheeze/SOB: Much better after last illness ER visits since last visit:none   The 10-year ASCVD risk score [name], et al., 2013) is: 9.1%   Values used to calculate the score:     Age: 53 years     Sex: Male     Is Non-Hispanic African American: No     Diabetic: No     Tobacco smoker: No     Systolic Blood Pressure: 944 mmHg     Is BP treated: Yes     HDL Cholesterol: 74 mg/dL     Total Cholesterol: 192 mg/dL   Depression screen [facility] 2/9 12/21/2017 06/29/2017 12/23/2016 06/24/2016 10/29/2015  Decreased Interest 0 0 1 0 0  Down, Depressed, Hopeless 0 0 1 1 0  PHQ - 2 Score 0 0 2 1 0  Altered sleeping 0 0 0 - -  Tired, decreased energy 0 1 1 - -  Change in appetite 0 0 0 - -  Feeling bad or failure about yourself  0 0 1 - -  Trouble concentrating 0 0 0 - -  Moving slowly or fidgety/restless 0 1 0 - -  Suicidal thoughts 0 0 0 - -  PHQ-9 Score 0 2 4 - -      Relevant past medical, surgical, family and social history reviewed and updated as indicated. Interim medical  history since our last visit reviewed. Allergies and medications reviewed and updated.  Review of Systems  Per HPI unless specifically indicated above     Objective:    BP (!) 138/94 (BP Location: Right Arm, Cuff Size: Normal)   Pulse (!) 58   Temp (!) 97.5 F (36.4 C) (Oral)   Ht 5' 11.8" (1.824 m)   Wt 194 lb 8 oz (88.2 kg)   SpO2 98%   BMI 26.53 kg/m   Wt Readings from Last 3 Encounters:  12/21/17 194 lb 8 oz (88.2 kg)  06/29/17 190 lb 9.6 oz (86.5 kg)  05/03/17 194 lb (88 kg)    Physical Exam  Constitutional: He is oriented to person, place, and time. He appears well-developed and well-nourished. No distress.  HENT:  Head: Normocephalic and atraumatic.  Eyes: Conjunctivae and lids are normal. Right eye exhibits no discharge. Left eye exhibits no discharge. No scleral icterus.  Neck: Normal range of motion. Neck supple. No JVD present. Carotid bruit is not present.  Cardiovascular: Normal rate, regular rhythm and normal heart sounds.  Pulmonary/Chest: Effort normal  and breath sounds normal. No respiratory distress.  Abdominal: Normal appearance. There is no splenomegaly or hepatomegaly.  Musculoskeletal: Normal range of motion.  Neurological: He is alert and oriented to person, place, and time.  Skin: Skin is warm, dry and intact. No rash noted. No pallor.  Psychiatric: He has a normal mood and affect. His behavior is normal. Judgment and thought content normal.    Results for orders placed or performed in visit on 06/29/17  Comprehensive metabolic panel  Result Value Ref Range   Glucose 82 65 - 99 mg/dL   BUN 13 8 - 27 mg/dL   Creatinine, Ser 0.83 0.76 - 1.27 mg/dL   GFR calc non Af Amer 95 >59 mL/min/1.73   GFR calc Af Amer 110 >59 mL/min/1.73   BUN/Creatinine Ratio 16 10 - 24   Sodium 139 134 - 144 mmol/L   Potassium 3.9 3.5 - 5.2 mmol/L   Chloride 96 96 - 106 mmol/L   CO2 24 20 - 29 mmol/L   Calcium 8.8 8.6 - 10.2 mg/dL   Total Protein 7.4 6.0 - 8.5 g/dL    Albumin 4.2 3.6 - 4.8 g/dL   Globulin, Total 3.2 1.5 - 4.5 g/dL   Albumin/Globulin Ratio 1.3 1.2 - 2.2   Bilirubin Total 0.7 0.0 - 1.2 mg/dL   Alkaline Phosphatase 66 39 - 117 IU/L   AST 21 0 - 40 IU/L   ALT 21 0 - 44 IU/L  CBC with Differential/Platelet  Result Value Ref Range   WBC 3.9 3.4 - 10.8 x10E3/uL   RBC 4.19 4.14 - 5.80 x10E6/uL   Hemoglobin 13.0 13.0 - 17.7 g/dL   Hematocrit 38.7 37.5 - 51.0 %   MCV 92 79 - 97 fL   MCH 31.0 26.6 - 33.0 pg   MCHC 33.6 31.5 - 35.7 g/dL   RDW 13.5 12.3 - 15.4 %   Platelets 191 150 - 379 x10E3/uL   Neutrophils 46 Not Estab. %   Lymphs 33 Not Estab. %   Monocytes 10 Not Estab. %   Eos 10 Not Estab. %   Basos 1 Not Estab. %   Neutrophils Absolute 1.8 1.4 - 7.0 x10E3/uL   Lymphocytes Absolute 1.3 0.7 - 3.1 x10E3/uL   Monocytes Absolute 0.4 0.1 - 0.9 x10E3/uL   EOS (ABSOLUTE) 0.4 0.0 - 0.4 x10E3/uL   Basophils Absolute 0.0 0.0 - 0.2 x10E3/uL   Immature Granulocytes 0 Not Estab. %   Immature Grans (Abs) 0.0 0.0 - 0.1 x10E3/uL  Lipid Panel w/o Chol/HDL Ratio  Result Value Ref Range   Cholesterol, Total 192 100 - 199 mg/dL   Triglycerides 125 0 - 149 mg/dL   HDL 74 >39 mg/dL   VLDL Cholesterol Cal 25 5 - 40 mg/dL   LDL Calculated 93 0 - 99 mg/dL  TSH  Result Value Ref Range   TSH 0.947 0.450 - 4.500 uIU/mL  PSA  Result Value Ref Range   Prostate Specific Ag, Serum 2.6 0.0 - 4.0 ng/mL      Assessment & Plan:   Problem List Items Addressed This Visit      Unprioritized   Asthma    Stable, continue present medications.        Hyperlipidemia    Not on statin.  Discussed ASCVD risk of 9.1%.  LDL below 100.  Pt refusing statins at this time      Relevant Medications   lisinopril (PRINIVIL,ZESTRIL) 20 MG tablet   Hypertension    Not to goal.  Increase Lisinopril from 10 to 20 mg.        Relevant Medications   lisinopril (PRINIVIL,ZESTRIL) 20 MG tablet   Major depression in full remission (HCC)    On Paroxetine.  Continue  present medications.            Follow up plan: Return in about 1 month (around 01/18/2018).

## 2017-12-21 NOTE — Assessment & Plan Note (Signed)
Not to goal.  Increase Lisinopril from 10 to 20 mg.

## 2017-12-21 NOTE — Assessment & Plan Note (Addendum)
Not on statin.  Discussed ASCVD risk of 9.1%.  LDL below 100.  Pt refusing statins at this time

## 2017-12-21 NOTE — Assessment & Plan Note (Signed)
Stable, continue present medications.   

## 2017-12-21 NOTE — Assessment & Plan Note (Signed)
On Paroxetine.  Continue present medications.

## 2017-12-27 DIAGNOSIS — J301 Allergic rhinitis due to pollen: Secondary | ICD-10-CM | POA: Diagnosis not present

## 2017-12-28 DIAGNOSIS — M76821 Posterior tibial tendinitis, right leg: Secondary | ICD-10-CM | POA: Diagnosis not present

## 2017-12-28 DIAGNOSIS — M76822 Posterior tibial tendinitis, left leg: Secondary | ICD-10-CM | POA: Diagnosis not present

## 2017-12-29 DIAGNOSIS — J301 Allergic rhinitis due to pollen: Secondary | ICD-10-CM | POA: Diagnosis not present

## 2018-01-06 DIAGNOSIS — J301 Allergic rhinitis due to pollen: Secondary | ICD-10-CM | POA: Diagnosis not present

## 2018-01-09 ENCOUNTER — Other Ambulatory Visit: Payer: Self-pay | Admitting: Unknown Physician Specialty

## 2018-01-24 DIAGNOSIS — J301 Allergic rhinitis due to pollen: Secondary | ICD-10-CM | POA: Diagnosis not present

## 2018-01-25 ENCOUNTER — Ambulatory Visit: Payer: BLUE CROSS/BLUE SHIELD | Admitting: Physician Assistant

## 2018-02-03 DIAGNOSIS — J301 Allergic rhinitis due to pollen: Secondary | ICD-10-CM | POA: Diagnosis not present

## 2018-02-11 DIAGNOSIS — J301 Allergic rhinitis due to pollen: Secondary | ICD-10-CM | POA: Diagnosis not present

## 2018-02-18 DIAGNOSIS — J301 Allergic rhinitis due to pollen: Secondary | ICD-10-CM | POA: Diagnosis not present

## 2018-02-25 DIAGNOSIS — J301 Allergic rhinitis due to pollen: Secondary | ICD-10-CM | POA: Diagnosis not present

## 2018-03-07 DIAGNOSIS — J301 Allergic rhinitis due to pollen: Secondary | ICD-10-CM | POA: Diagnosis not present

## 2018-03-14 DIAGNOSIS — J301 Allergic rhinitis due to pollen: Secondary | ICD-10-CM | POA: Diagnosis not present

## 2018-03-21 DIAGNOSIS — J301 Allergic rhinitis due to pollen: Secondary | ICD-10-CM | POA: Diagnosis not present

## 2018-03-22 DIAGNOSIS — J301 Allergic rhinitis due to pollen: Secondary | ICD-10-CM | POA: Diagnosis not present

## 2018-03-28 DIAGNOSIS — J301 Allergic rhinitis due to pollen: Secondary | ICD-10-CM | POA: Diagnosis not present

## 2018-04-10 ENCOUNTER — Other Ambulatory Visit: Payer: Self-pay | Admitting: Family Medicine

## 2018-04-11 DIAGNOSIS — J301 Allergic rhinitis due to pollen: Secondary | ICD-10-CM | POA: Diagnosis not present

## 2018-04-11 NOTE — Telephone Encounter (Signed)
Refill request approved

## 2018-04-18 DIAGNOSIS — J301 Allergic rhinitis due to pollen: Secondary | ICD-10-CM | POA: Diagnosis not present

## 2018-04-25 DIAGNOSIS — J301 Allergic rhinitis due to pollen: Secondary | ICD-10-CM | POA: Diagnosis not present

## 2018-05-06 DIAGNOSIS — J301 Allergic rhinitis due to pollen: Secondary | ICD-10-CM | POA: Diagnosis not present

## 2018-05-09 ENCOUNTER — Ambulatory Visit: Payer: Self-pay

## 2018-05-09 NOTE — Telephone Encounter (Signed)
Incoming call from Patient with complaint of Bilateral swelling of legs.   Patient states that he has been on amlodipine 5mg  for  years approximately 15 years.  Patient states he also takes Lisinopril 20 mg for blood pressure. Patient  States that he has been feeling sluggish also.  The onset was around a month or two ago for swelling .   Swelling is from calf down to ankle. Denies  Redness, pain, and  and fever.  Patient feels the medication amlodipine is causing the swelling.   Denies medical history.  States this the first time this is the first time this has happened.  Other symptoms  Reported is a feeling of sluggishness. Scheduled an appointment for 05/12/18 @ 11:00 with arrival of 10:45am Patient voices understanding. Reviewed care advice patient voiced understanding.      Reason for Disposition . [1] MILD swelling of both ankles (i.e., pedal edema) AND [2] new onset or worsening  Answer Assessment - Initial Assessment Questions 1. ONSET: "When did the swelling start?" (e.g., minutes, hours, days)      A month  Or 2 2. LOCATION: "What part of the leg is swollen?"  "Are both legs swollen or just one leg?"     Ankle to calf 3. SEVERITY: "How bad is the swelling?" (e.g., localized; mild, moderate, severe)  - Localized - small area of swelling localized to one leg  - MILD pedal edema - swelling limited to foot and ankle, pitting edema < 1/4 inch (6 mm) deep, rest and elevation eliminate most or all swelling  - MODERATE edema - swelling of lower leg to knee, pitting edema > 1/4 inch (6 mm) deep, rest and elevation only partially reduce swelling  - SEVERE edema - swelling extends above knee, facial or hand swelling present      mild 4. REDNESS: "Does the swelling look red or infected?"     no 5. PAIN: "Is the swelling painful to touch?" If so, ask: "How painful is it?"   (Scale 1-10; mild, moderate or severe)      No pain 6. FEVER: "Do you have a fever?" If so, ask: "What is it, how was it  measured, and when did it start?"      no 7. CAUSE: "What do you think is causing the leg swelling?"     medication 8. MEDICAL HISTORY: "Do you have a history of heart failure, kidney disease, liver failure, or cancer?"     no 9. RECURRENT SYMPTOM: "Have you had leg swelling before?" If so, ask: "When was the last time?" "What happened that time?"     no 10. OTHER SYMPTOMS: "Do you have any other symptoms?" (e.g., chest pain, difficulty breathing)       Sluggish no 11. PREGNANCY: "Is there any chance you are pregnant?" "When was your last menstrual period?"       na  Protocols used: LEG SWELLING AND EDEMA-A-AH

## 2018-05-11 ENCOUNTER — Ambulatory Visit (INDEPENDENT_AMBULATORY_CARE_PROVIDER_SITE_OTHER): Payer: BLUE CROSS/BLUE SHIELD | Admitting: Nurse Practitioner

## 2018-05-11 ENCOUNTER — Other Ambulatory Visit: Payer: Self-pay

## 2018-05-11 ENCOUNTER — Encounter: Payer: Self-pay | Admitting: Nurse Practitioner

## 2018-05-11 VITALS — BP 137/87 | HR 64 | Temp 98.1°F | Ht 72.0 in | Wt 193.0 lb

## 2018-05-11 DIAGNOSIS — I10 Essential (primary) hypertension: Secondary | ICD-10-CM | POA: Diagnosis not present

## 2018-05-11 DIAGNOSIS — Z23 Encounter for immunization: Secondary | ICD-10-CM | POA: Diagnosis not present

## 2018-05-11 NOTE — Progress Notes (Signed)
BP 137/87   Pulse 64   Temp 98.1 F (36.7 C) (Oral)   Ht 6' (1.829 m)   Wt 193 lb (87.5 kg)   SpO2 95%   BMI 26.18 kg/m    Subjective:    Patient ID: Jonathan Roberson, male    DOB: Jul 28, 1955, 62 y.o.   MRN: [id number]  HPI: Jonathan Roberson is a 62 y.o. male presents for HTN  Chief Complaint  Patient presents with  . Hypertension    pt states would like to discuss BP medication   HYPERTENSION Hypertension status: controlled  Satisfied with current treatment? yes Duration of hypertension: chronic BP monitoring frequency:  not checking BP range: does not do at home BP medication side effects:  no Medication compliance: good compliance Previous BP meds: Amlodipine and Lisinopril Aspirin: no Recurrent headaches: no Visual changes: no Palpitations: no Dyspnea: no Chest pain: no Lower extremity edema: no Dizzy/lightheaded: no  Relevant past medical, surgical, family and social history reviewed and updated as indicated. Interim medical history since our last visit reviewed. Allergies and medications reviewed and updated.  Review of Systems  Constitutional: Negative for activity change, diaphoresis, fatigue and fever.  Respiratory: Negative for cough, chest tightness, shortness of breath and wheezing.   Cardiovascular: Negative for chest pain, palpitations and leg swelling.  Gastrointestinal: Negative for abdominal distention, abdominal pain, constipation, diarrhea, nausea and vomiting.  Endocrine: Negative for cold intolerance, heat intolerance, polydipsia, polyphagia and polyuria.  Musculoskeletal: Negative.   Skin: Negative.   Neurological: Negative for dizziness, syncope, weakness, light-headedness, numbness and headaches.  Psychiatric/Behavioral: Negative.     Per HPI unless specifically indicated above     Objective:    BP 137/87   Pulse 64   Temp 98.1 F (36.7 C) (Oral)   Ht 6' (1.829 m)   Wt 193 lb (87.5 kg)   SpO2 95%   BMI 26.18 kg/m   Wt Readings  from Last 3 Encounters:  05/11/18 193 lb (87.5 kg)  12/21/17 194 lb 8 oz (88.2 kg)  06/29/17 190 lb 9.6 oz (86.5 kg)    Physical Exam  Constitutional: He is oriented to person, place, and time. He appears well-developed and well-nourished.  HENT:  Head: Normocephalic and atraumatic.  Right Ear: Hearing normal. No drainage.  Left Ear: Hearing normal. No drainage.  Mouth/Throat: Uvula is midline and mucous membranes are normal.  Eyes: Pupils are equal, round, and reactive to light. Conjunctivae, EOM and lids are normal. Right eye exhibits no discharge. Left eye exhibits no discharge.  Neck: Trachea normal and normal range of motion. Neck supple. No JVD present. Carotid bruit is not present. No thyromegaly present.  Cardiovascular: Normal rate, regular rhythm, S1 normal, S2 normal and normal heart sounds. Exam reveals no gallop.  No murmur heard. Pulmonary/Chest: Effort normal and breath sounds normal.  Abdominal: Soft. Bowel sounds are normal. There is no splenomegaly or hepatomegaly.  Musculoskeletal: Normal range of motion.  Neurological: He is alert and oriented to person, place, and time. He has normal reflexes.  Skin: Skin is warm, dry and intact. Capillary refill takes less than 2 seconds. No rash noted.  Psychiatric: He has a normal mood and affect. His behavior is normal. Judgment and thought content normal.  Nursing note and vitals reviewed.   Results for orders placed or performed in visit on 06/29/17  Comprehensive metabolic panel  Result Value Ref Range   Glucose 82 65 - 99 mg/dL   BUN 13 8 - 27 mg/dL  Creatinine, Ser 0.83 0.76 - 1.27 mg/dL   GFR calc non Af Amer 95 >59 mL/min/1.73   GFR calc Af Amer 110 >59 mL/min/1.73   BUN/Creatinine Ratio 16 10 - 24   Sodium 139 134 - 144 mmol/L   Potassium 3.9 3.5 - 5.2 mmol/L   Chloride 96 96 - 106 mmol/L   CO2 24 20 - 29 mmol/L   Calcium 8.8 8.6 - 10.2 mg/dL   Total Protein 7.4 6.0 - 8.5 g/dL   Albumin 4.2 3.6 - 4.8 g/dL    Globulin, Total 3.2 1.5 - 4.5 g/dL   Albumin/Globulin Ratio 1.3 1.2 - 2.2   Bilirubin Total 0.7 0.0 - 1.2 mg/dL   Alkaline Phosphatase 66 39 - 117 IU/L   AST 21 0 - 40 IU/L   ALT 21 0 - 44 IU/L  CBC with Differential/Platelet  Result Value Ref Range   WBC 3.9 3.4 - 10.8 x10E3/uL   RBC 4.19 4.14 - 5.80 x10E6/uL   Hemoglobin 13.0 13.0 - 17.7 g/dL   Hematocrit 38.7 37.5 - 51.0 %   MCV 92 79 - 97 fL   MCH 31.0 26.6 - 33.0 pg   MCHC 33.6 31.5 - 35.7 g/dL   RDW 13.5 12.3 - 15.4 %   Platelets 191 150 - 379 x10E3/uL   Neutrophils 46 Not Estab. %   Lymphs 33 Not Estab. %   Monocytes 10 Not Estab. %   Eos 10 Not Estab. %   Basos 1 Not Estab. %   Neutrophils Absolute 1.8 1.4 - 7.0 x10E3/uL   Lymphocytes Absolute 1.3 0.7 - 3.1 x10E3/uL   Monocytes Absolute 0.4 0.1 - 0.9 x10E3/uL   EOS (ABSOLUTE) 0.4 0.0 - 0.4 x10E3/uL   Basophils Absolute 0.0 0.0 - 0.2 x10E3/uL   Immature Granulocytes 0 Not Estab. %   Immature Grans (Abs) 0.0 0.0 - 0.1 x10E3/uL  Lipid Panel w/o Chol/HDL Ratio  Result Value Ref Range   Cholesterol, Total 192 100 - 199 mg/dL   Triglycerides 125 0 - 149 mg/dL   HDL 74 >39 mg/dL   VLDL Cholesterol Cal 25 5 - 40 mg/dL   LDL Calculated 93 0 - 99 mg/dL  TSH  Result Value Ref Range   TSH 0.947 0.450 - 4.500 uIU/mL  PSA  Result Value Ref Range   Prostate Specific Ag, Serum 2.6 0.0 - 4.0 ng/mL      Assessment & Plan:   Problem List Items Addressed This Visit      Cardiovascular and Mediastinum   Hypertension - Primary    Chronic, ongoing.  Initial BP elevated today, white coat anxiety.  Repeat close to goal.  Continue current medication regimen. Have encouraged focus on diet and exercise for mood and overall health (30 minutes of exercise 5 days a week).  Labs next visit.       Other Visit Diagnoses    Flu vaccine need       Relevant Orders   Flu Vaccine QUAD 36+ mos IM (Completed)       Follow up plan: Return in about 3 months (around 08/10/2018) for  HTN/HLD/Depression.

## 2018-05-11 NOTE — Patient Instructions (Signed)
Hydrochlorothiazide, HCTZ capsules or tablets What is this medicine? HYDROCHLOROTHIAZIDE (hye droe klor oh THYE a zide) is a diuretic. It increases the amount of urine passed, which causes the body to lose salt and water. This medicine is used to treat high blood pressure. It is also reduces the swelling and water retention caused by various medical conditions, such as heart, liver, or kidney disease. This medicine may be used for other purposes; ask your health care provider or pharmacist if you have questions. COMMON BRAND NAME(S): Esidrix, Ezide, HydroDIURIL, Microzide, Oretic, Zide What should I tell my health care provider before I take this medicine? They need to know if you have any of these conditions: -diabetes -gout -immune system problems, like lupus -kidney disease or kidney stones -liver disease -pancreatitis -small amount of urine or difficulty passing urine -an unusual or allergic reaction to hydrochlorothiazide, sulfa drugs, other medicines, foods, dyes, or preservatives -pregnant or trying to get pregnant -breast-feeding How should I use this medicine? Take this medicine by mouth with a glass of water. Follow the directions on the prescription label. Take your medicine at regular intervals. Remember that you will need to pass urine frequently after taking this medicine. Do not take your doses at a time of day that will cause you problems. Do not stop taking your medicine unless your doctor tells you to. Talk to your pediatrician regarding the use of this medicine in children. Special care may be needed. Overdosage: If you think you have taken too much of this medicine contact a poison control center or emergency room at once. NOTE: This medicine is only for you. Do not share this medicine with others. What if I miss a dose? If you miss a dose, take it as soon as you can. If it is almost time for your next dose, take only that dose. Do not take double or extra doses. What may  interact with this medicine? -cholestyramine -colestipol -digoxin -dofetilide -lithium -medicines for blood pressure -medicines for diabetes -medicines that relax muscles for surgery -other diuretics -steroid medicines like prednisone or cortisone This list may not describe all possible interactions. Give your health care provider a list of all the medicines, herbs, non-prescription drugs, or dietary supplements you use. Also tell them if you smoke, drink alcohol, or use illegal drugs. Some items may interact with your medicine. What should I watch for while using this medicine? Visit your doctor or health care professional for regular checks on your progress. Check your blood pressure as directed. Ask your doctor or health care professional what your blood pressure should be and when you should contact him or her. You may need to be on a special diet while taking this medicine. Ask your doctor. Check with your doctor or health care professional if you get an attack of severe diarrhea, nausea and vomiting, or if you sweat a lot. The loss of too much body fluid can make it dangerous for you to take this medicine. You may get drowsy or dizzy. Do not drive, use machinery, or do anything that needs mental alertness until you know how this medicine affects you. Do not stand or sit up quickly, especially if you are an older patient. This reduces the risk of dizzy or fainting spells. Alcohol may interfere with the effect of this medicine. Avoid alcoholic drinks. This medicine may affect your blood sugar level. If you have diabetes, check with your doctor or health care professional before changing the dose of your diabetic medicine. This medicine  can make you more sensitive to the sun. Keep out of the sun. If you cannot avoid being in the sun, wear protective clothing and use sunscreen. Do not use sun lamps or tanning beds/booths. What side effects may I notice from receiving this medicine? Side effects  that you should report to your doctor or health care professional as soon as possible: -allergic reactions such as skin rash or itching, hives, swelling of the lips, mouth, tongue, or throat -changes in vision -chest pain -eye pain -fast or irregular heartbeat -feeling faint or lightheaded, falls -gout attack -muscle pain or cramps -pain or difficulty when passing urine -pain, tingling, numbness in the hands or feet -redness, blistering, peeling or loosening of the skin, including inside the mouth -unusually weak or tired Side effects that usually do not require medical attention (report to your doctor or health care professional if they continue or are bothersome): -change in sex drive or performance -dry mouth -headache -stomach upset This list may not describe all possible side effects. Call your doctor for medical advice about side effects. You may report side effects to FDA at 1-800-FDA-1088. Where should I keep my medicine? Keep out of the reach of children. Store at room temperature between 15 and 30 degrees C (59 and 86 degrees F). Do not freeze. Protect from light and moisture. Keep container closed tightly. Throw away any unused medicine after the expiration date. NOTE: This sheet is a summary. It may not cover all possible information. If you have questions about this medicine, talk to your doctor, pharmacist, or health care provider.  2018 Elsevier/Gold Standard (2010-01-10 12:57:37)  

## 2018-05-11 NOTE — Assessment & Plan Note (Addendum)
Chronic, ongoing.  Initial BP elevated today, white coat anxiety.  Repeat close to goal.  Continue current medication regimen. Have encouraged focus on diet and exercise for mood and overall health (30 minutes of exercise 5 days a week).  Labs next visit.

## 2018-05-12 ENCOUNTER — Ambulatory Visit: Payer: Self-pay | Admitting: Nurse Practitioner

## 2018-05-18 IMAGING — DX DG CHEST 2V
3 series · 3 of 3 positions shown · non-contrast
Comparison: 06/09/2005

CLINICAL DATA: Shortness of breath .

EXAM:
CHEST  2 VIEW

[chest pa]
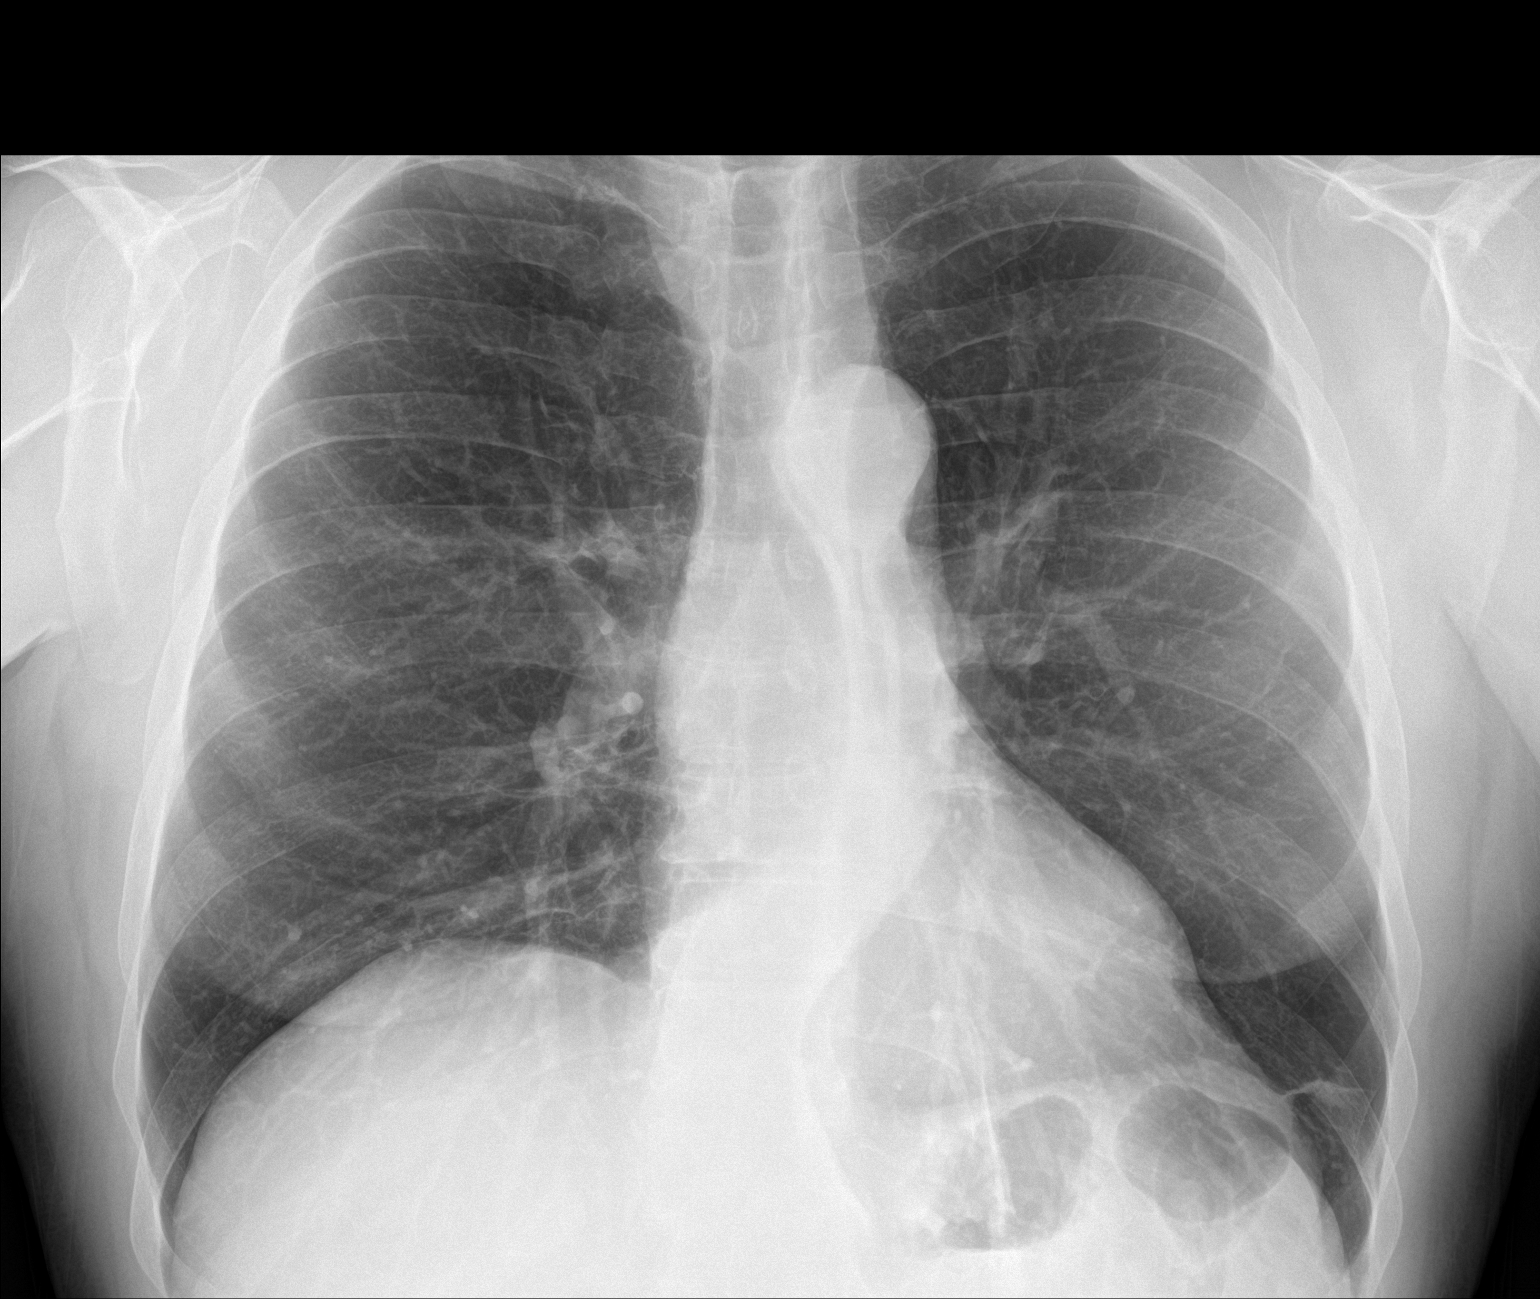

[chest lat (1 of 2)]
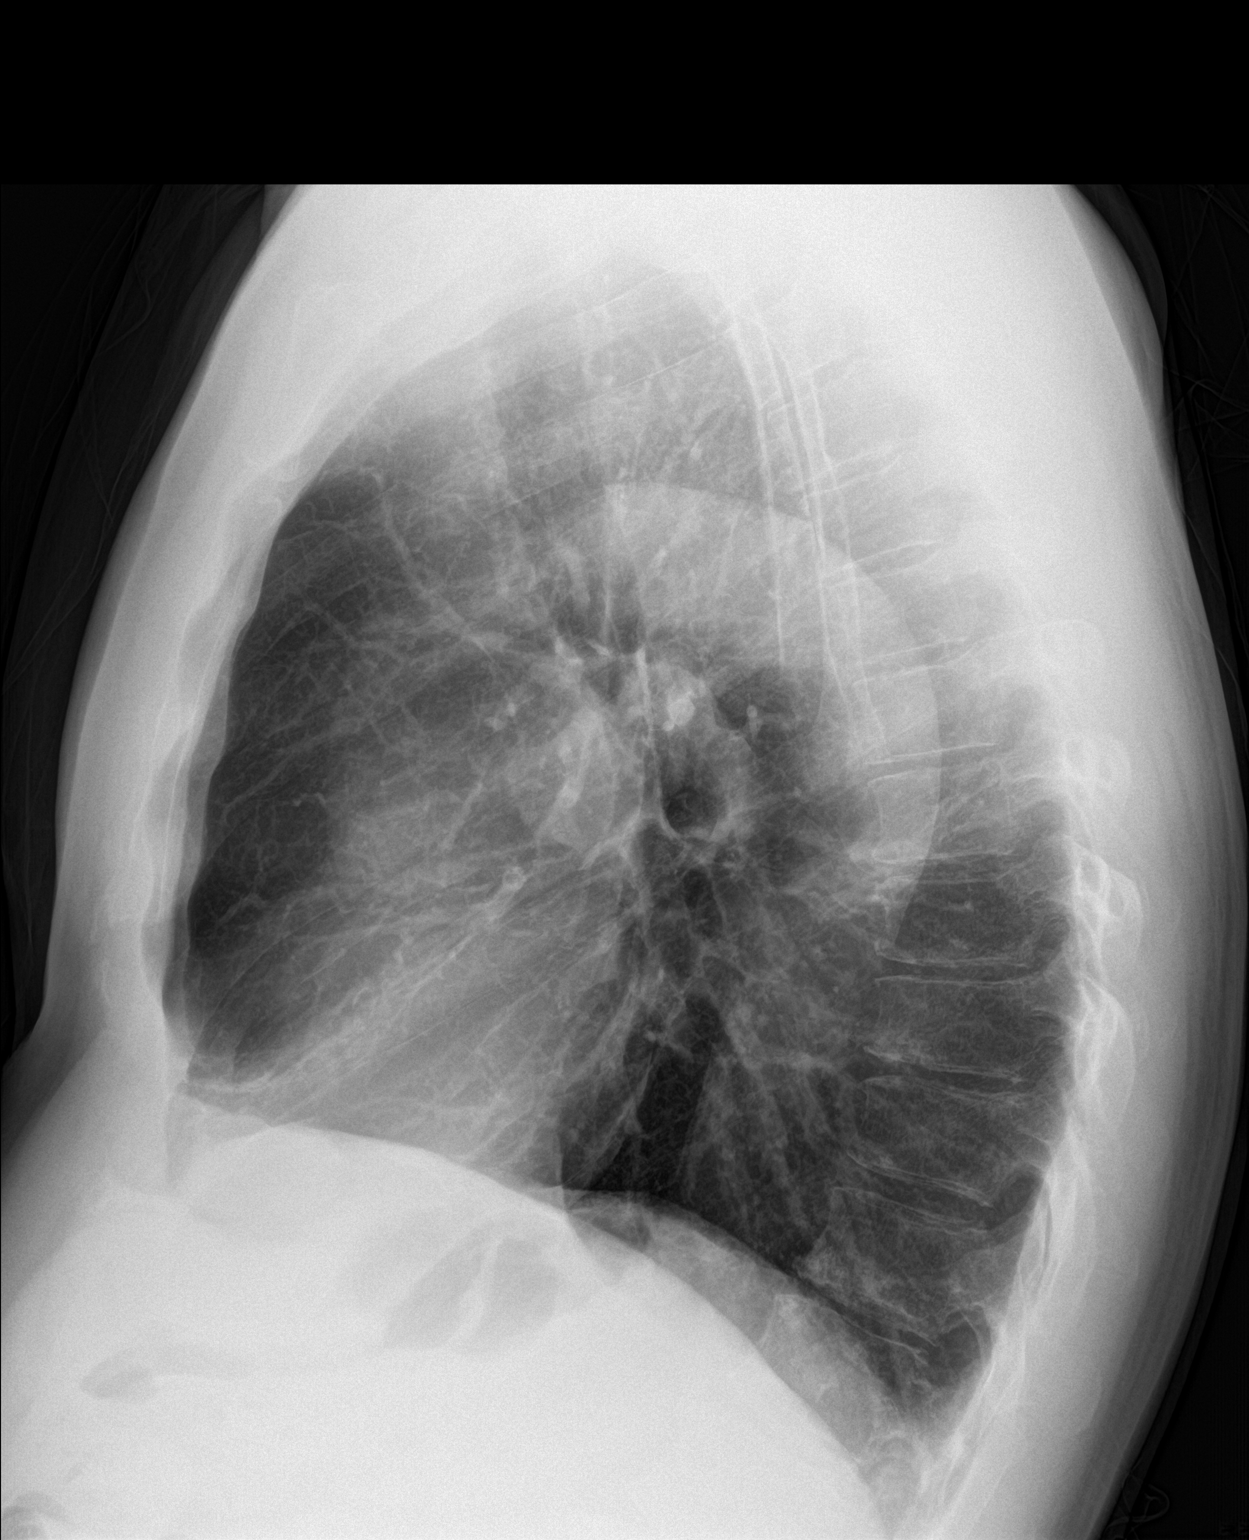

[chest lat (2 of 2)]
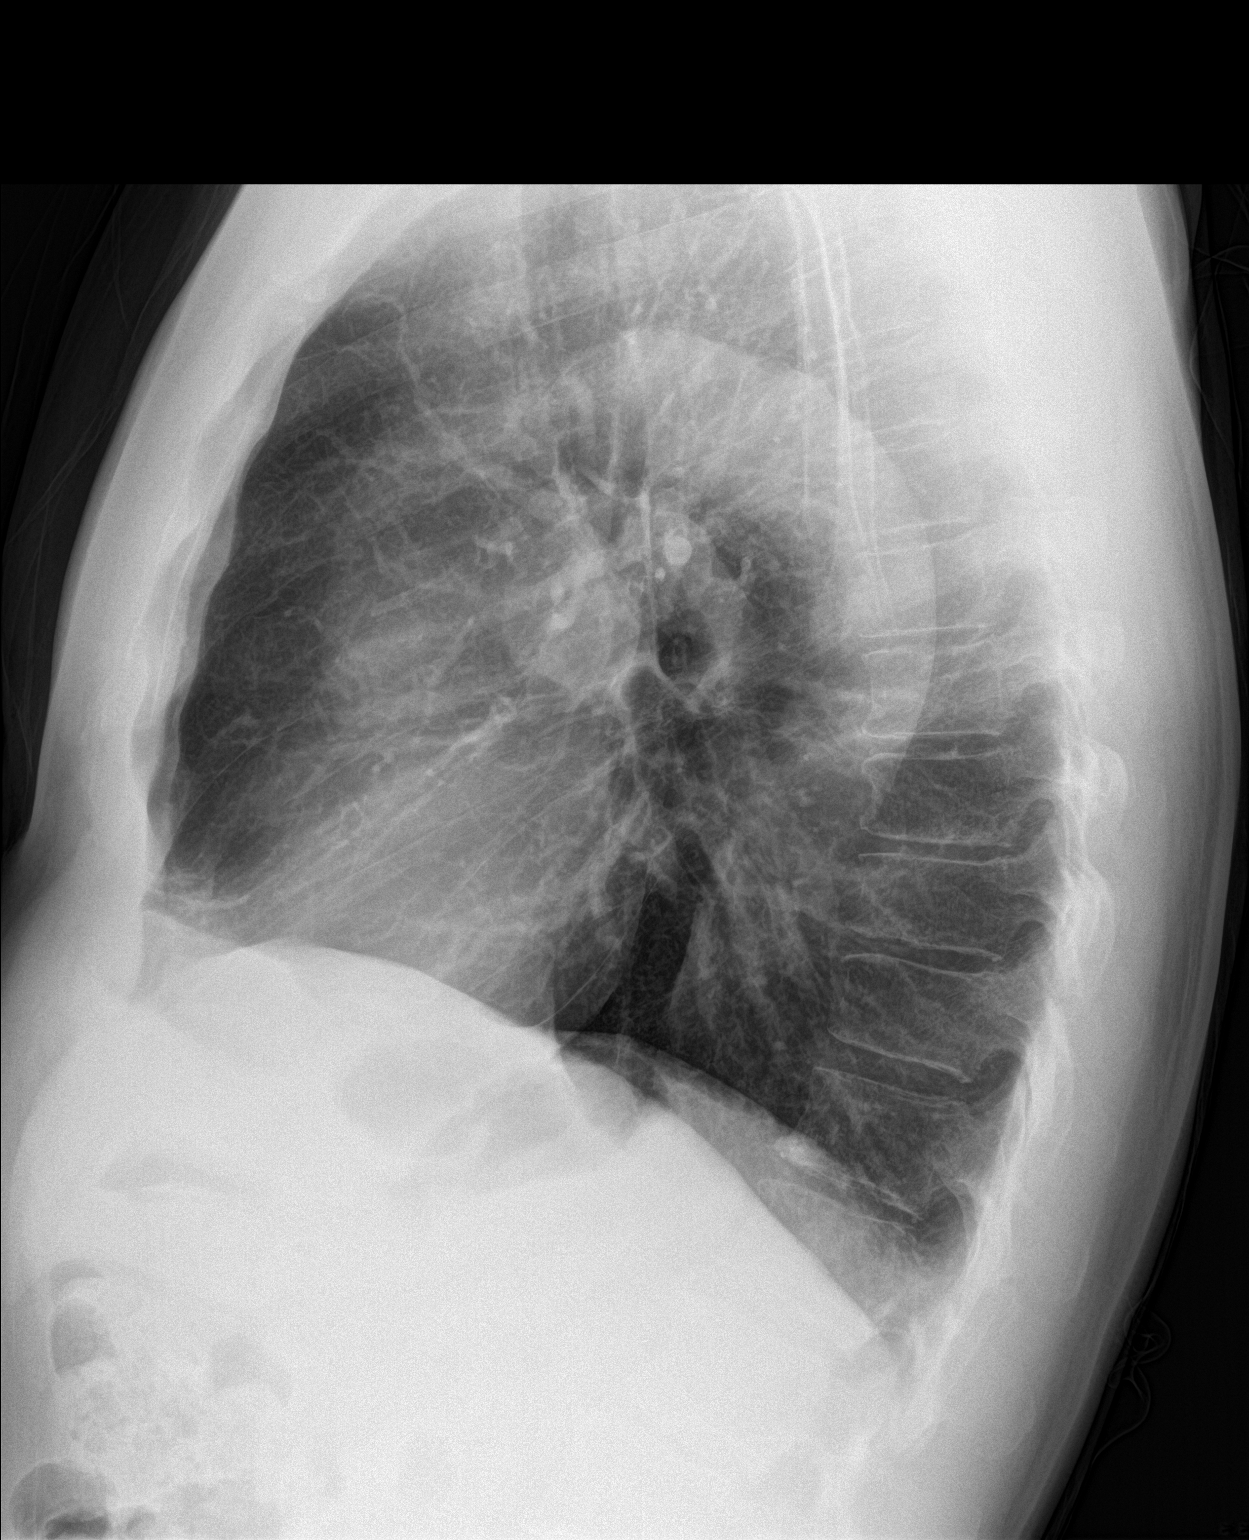

[3 of 3 positions shown; findings below may reference images not displayed]

FINDINGS: Mediastinum and hilar structures are normal. Mild left base
subsegmental atelectasis. No pleural effusion pneumothorax. Heart
size normal. No acute bony abnormality P
IMPRESSION: Mild left base subsegmental atelectasis .

## 2018-05-20 DIAGNOSIS — J301 Allergic rhinitis due to pollen: Secondary | ICD-10-CM | POA: Diagnosis not present

## 2018-05-26 DIAGNOSIS — J301 Allergic rhinitis due to pollen: Secondary | ICD-10-CM | POA: Diagnosis not present

## 2018-05-30 ENCOUNTER — Other Ambulatory Visit: Payer: Self-pay | Admitting: Nurse Practitioner

## 2018-05-30 ENCOUNTER — Telehealth: Payer: Self-pay | Admitting: Nurse Practitioner

## 2018-05-30 MED ORDER — FLUTICASONE PROPIONATE 50 MCG/ACT NA SUSP: 2.0000 | Freq: Every day | NASAL | 12 refills | Status: DC

## 2018-05-30 MED ORDER — PAROXETINE HCL 20 MG PO TABS: 20.0000 mg | ORAL_TABLET | Freq: Every day | ORAL | 3 refills | Status: DC

## 2018-05-30 MED ORDER — PAROXETINE HCL 20 MG PO TABS: 20.0000 mg | ORAL_TABLET | Freq: Every day | ORAL | 0 refills | Status: DC

## 2018-05-30 NOTE — Progress Notes (Signed)
Request for 7 day rx to [facility] as patient is out and then remainder to [facility].

## 2018-05-30 NOTE — Telephone Encounter (Signed)
Copied from [place] [id number]. Topic: Quick Communication - Rx Refill/Question >> May 30, 2018 10:55 AM Scherrie Gerlach wrote: Medication:  PARoxetine (PAXIL) 20 MG tablet  Fluticasone-Salmeterol (ADVAIR) 100-50 MCG/DOSE AEPB   Has the patient contacted their pharmacy? Yes [facility] cheryl wicker listed and has not received anything from the dr. Abbott Pao sees [facility] now.  Pt is out of his paxil and requesting a 7 day rx sent to local [place], [place]. [phone] (Phone) [phone] (Fax)  Then the rest sent to [place], [place] [phone] (Phone) [phone] (Fax)

## 2018-05-30 NOTE — Telephone Encounter (Signed)
done

## 2018-06-07 DIAGNOSIS — J301 Allergic rhinitis due to pollen: Secondary | ICD-10-CM | POA: Diagnosis not present

## 2018-06-14 DIAGNOSIS — J301 Allergic rhinitis due to pollen: Secondary | ICD-10-CM | POA: Diagnosis not present

## 2018-06-21 DIAGNOSIS — J301 Allergic rhinitis due to pollen: Secondary | ICD-10-CM | POA: Diagnosis not present

## 2018-06-22 DIAGNOSIS — J301 Allergic rhinitis due to pollen: Secondary | ICD-10-CM | POA: Diagnosis not present

## 2018-07-08 DIAGNOSIS — J301 Allergic rhinitis due to pollen: Secondary | ICD-10-CM | POA: Diagnosis not present

## 2018-07-09 ENCOUNTER — Other Ambulatory Visit: Payer: Self-pay | Admitting: Family Medicine

## 2018-07-11 NOTE — Telephone Encounter (Signed)
Requested Prescriptions  Pending Prescriptions Disp Refills  . amLODipine (NORVASC) 5 MG tablet [Pharmacy Med Name: AMLODIPINE BESYLATE TABS 5MG ] 180 tablet 4    Sig: TAKE 2 TABLETS DAILY     Cardiovascular:  Calcium Channel Blockers Passed - 07/09/2018  1:00 AM      Passed - Last BP in normal range    BP Readings from Last 1 Encounters:  05/11/18 137/87         Passed - Valid encounter within last 6 months    Recent Outpatient Visits          2 months ago Essential hypertension   [facility], [name], NP   6 months ago Essential hypertension   [facility] Kathrine Haddock, NP   1 year ago Annual physical exam   [facility] Kathrine Haddock, NP   1 year ago Moderate persistent asthma with acute exacerbation   [facility] Kathrine Haddock, NP   1 year ago SOB (shortness of breath)   [facility] Kathrine Haddock, NP      Future Appointments            In 1 month [name], [name], NP [employer]

## 2018-07-15 DIAGNOSIS — J301 Allergic rhinitis due to pollen: Secondary | ICD-10-CM | POA: Diagnosis not present

## 2018-07-19 DIAGNOSIS — J301 Allergic rhinitis due to pollen: Secondary | ICD-10-CM | POA: Diagnosis not present

## 2018-07-29 DIAGNOSIS — J301 Allergic rhinitis due to pollen: Secondary | ICD-10-CM | POA: Diagnosis not present

## 2018-08-05 DIAGNOSIS — J301 Allergic rhinitis due to pollen: Secondary | ICD-10-CM | POA: Diagnosis not present

## 2018-08-10 ENCOUNTER — Other Ambulatory Visit: Payer: Self-pay

## 2018-08-10 ENCOUNTER — Ambulatory Visit (INDEPENDENT_AMBULATORY_CARE_PROVIDER_SITE_OTHER): Payer: BLUE CROSS/BLUE SHIELD | Admitting: Nurse Practitioner

## 2018-08-10 ENCOUNTER — Encounter: Payer: Self-pay | Admitting: Nurse Practitioner

## 2018-08-10 VITALS — BP 138/92 | HR 64 | Temp 98.1°F | Ht 72.0 in | Wt 193.0 lb

## 2018-08-10 DIAGNOSIS — I10 Essential (primary) hypertension: Secondary | ICD-10-CM

## 2018-08-10 DIAGNOSIS — F3342 Major depressive disorder, recurrent, in full remission: Secondary | ICD-10-CM

## 2018-08-10 DIAGNOSIS — E7800 Pure hypercholesterolemia, unspecified: Secondary | ICD-10-CM | POA: Diagnosis not present

## 2018-08-10 MED ORDER — LISINOPRIL 40 MG PO TABS: 40.0000 mg | ORAL_TABLET | Freq: Every day | ORAL | 3 refills | Status: DC

## 2018-08-10 NOTE — Assessment & Plan Note (Addendum)
Chronic, ongoing, fluctuating mood. Pt to inform provider if symptoms worsen or if he feels medication adjustment is needed. Continued Paxil 20 mg today. Follow-up in 6 months.

## 2018-08-10 NOTE — Patient Instructions (Signed)
DASH Eating Plan  DASH stands for "Dietary Approaches to Stop Hypertension." The DASH eating plan is a healthy eating plan that has been shown to reduce high blood pressure (hypertension). It may also reduce your risk for type 2 diabetes, heart disease, and stroke. The DASH eating plan may also help with weight loss.  What are tips for following this plan?    General guidelines   Avoid eating more than 2,300 mg (milligrams) of salt (sodium) a day. If you have hypertension, you may need to reduce your sodium intake to 1,500 mg a day.   Limit alcohol intake to no more than 1 drink a day for nonpregnant women and 2 drinks a day for men. One drink equals 12 oz of beer, 5 oz of wine, or 1 oz of hard liquor.   Work with your health care provider to maintain a healthy body weight or to lose weight. Ask what an ideal weight is for you.   Get at least 30 minutes of exercise that causes your heart to beat faster (aerobic exercise) most days of the week. Activities may include walking, swimming, or biking.   Work with your health care provider or diet and nutrition specialist (dietitian) to adjust your eating plan to your individual calorie needs.  Reading food labels     Check food labels for the amount of sodium per serving. Choose foods with less than 5 percent of the Daily Value of sodium. Generally, foods with less than 300 mg of sodium per serving fit into this eating plan.   To find whole grains, look for the word "whole" as the first word in the ingredient list.  Shopping   Buy products labeled as "low-sodium" or "no salt added."   Buy fresh foods. Avoid canned foods and premade or frozen meals.  Cooking   Avoid adding salt when cooking. Use salt-free seasonings or herbs instead of table salt or sea salt. Check with your health care provider or pharmacist before using salt substitutes.   Do not fry foods. Cook foods using healthy methods such as baking, boiling, grilling, and broiling instead.   Cook with  heart-healthy oils, such as olive, canola, soybean, or sunflower oil.  Meal planning   Eat a balanced diet that includes:  ? 5 or more servings of fruits and vegetables each day. At each meal, try to fill half of your plate with fruits and vegetables.  ? Up to 6-8 servings of whole grains each day.  ? Less than 6 oz of lean meat, poultry, or fish each day. A 3-oz serving of meat is about the same size as a deck of cards. One egg equals 1 oz.  ? 2 servings of low-fat dairy each day.  ? A serving of nuts, seeds, or beans 5 times each week.  ? Heart-healthy fats. Healthy fats called Omega-3 fatty acids are found in foods such as flaxseeds and coldwater fish, like sardines, salmon, and mackerel.   Limit how much you eat of the following:  ? Canned or prepackaged foods.  ? Food that is high in trans fat, such as fried foods.  ? Food that is high in saturated fat, such as fatty meat.  ? Sweets, desserts, sugary drinks, and other foods with added sugar.  ? Full-fat dairy products.   Do not salt foods before eating.   Try to eat at least 2 vegetarian meals each week.   Eat more home-cooked food and less restaurant, buffet, and fast food.     When eating at a restaurant, ask that your food be prepared with less salt or no salt, if possible.  What foods are recommended?  The items listed may not be a complete list. Talk with your dietitian about what dietary choices are best for you.  Grains  Whole-grain or whole-wheat bread. Whole-grain or whole-wheat pasta. Brown rice. Oatmeal. Quinoa. Bulgur. Whole-grain and low-sodium cereals. Pita bread. Low-fat, low-sodium crackers. Whole-wheat flour tortillas.  Vegetables  Fresh or frozen vegetables (raw, steamed, roasted, or grilled). Low-sodium or reduced-sodium tomato and vegetable juice. Low-sodium or reduced-sodium tomato sauce and tomato paste. Low-sodium or reduced-sodium canned vegetables.  Fruits  All fresh, dried, or frozen fruit. Canned fruit in natural juice (without  added sugar).  Meat and other protein foods  Skinless chicken or turkey. Ground chicken or turkey. Pork with fat trimmed off. Fish and seafood. Egg whites. Dried beans, peas, or lentils. Unsalted nuts, nut butters, and seeds. Unsalted canned beans. Lean cuts of beef with fat trimmed off. Low-sodium, lean deli meat.  Dairy  Low-fat (1%) or fat-free (skim) milk. Fat-free, low-fat, or reduced-fat cheeses. Nonfat, low-sodium ricotta or cottage cheese. Low-fat or nonfat yogurt. Low-fat, low-sodium cheese.  Fats and oils  Soft margarine without trans fats. Vegetable oil. Low-fat, reduced-fat, or light mayonnaise and salad dressings (reduced-sodium). Canola, safflower, olive, soybean, and sunflower oils. Avocado.  Seasoning and other foods  Herbs. Spices. Seasoning mixes without salt. Unsalted popcorn and pretzels. Fat-free sweets.  What foods are not recommended?  The items listed may not be a complete list. Talk with your dietitian about what dietary choices are best for you.  Grains  Baked goods made with fat, such as croissants, muffins, or some breads. Dry pasta or rice meal packs.  Vegetables  Creamed or fried vegetables. Vegetables in a cheese sauce. Regular canned vegetables (not low-sodium or reduced-sodium). Regular canned tomato sauce and paste (not low-sodium or reduced-sodium). Regular tomato and vegetable juice (not low-sodium or reduced-sodium). Pickles. Olives.  Fruits  Canned fruit in a light or heavy syrup. Fried fruit. Fruit in cream or butter sauce.  Meat and other protein foods  Fatty cuts of meat. Ribs. Fried meat. Bacon. Sausage. Bologna and other processed lunch meats. Salami. Fatback. Hotdogs. Bratwurst. Salted nuts and seeds. Canned beans with added salt. Canned or smoked fish. Whole eggs or egg yolks. Chicken or turkey with skin.  Dairy  Whole or 2% milk, cream, and half-and-half. Whole or full-fat cream cheese. Whole-fat or sweetened yogurt. Full-fat cheese. Nondairy creamers. Whipped toppings.  Processed cheese and cheese spreads.  Fats and oils  Butter. Stick margarine. Lard. Shortening. Ghee. Bacon fat. Tropical oils, such as coconut, palm kernel, or palm oil.  Seasoning and other foods  Salted popcorn and pretzels. Onion salt, garlic salt, seasoned salt, table salt, and sea salt. Worcestershire sauce. Tartar sauce. Barbecue sauce. Teriyaki sauce. Soy sauce, including reduced-sodium. Steak sauce. Canned and packaged gravies. Fish sauce. Oyster sauce. Cocktail sauce. Horseradish that you find on the shelf. Ketchup. Mustard. Meat flavorings and tenderizers. Bouillon cubes. Hot sauce and Tabasco sauce. Premade or packaged marinades. Premade or packaged taco seasonings. Relishes. Regular salad dressings.  Where to find more information:   National Heart, Lung, and Blood Institute: www.nhlbi.nih.gov   American Heart Association: www.heart.org  Summary   The DASH eating plan is a healthy eating plan that has been shown to reduce high blood pressure (hypertension). It may also reduce your risk for type 2 diabetes, heart disease, and stroke.   With the   DASH eating plan, you should limit salt (sodium) intake to 2,300 mg a day. If you have hypertension, you may need to reduce your sodium intake to 1,500 mg a day.   When on the DASH eating plan, aim to eat more fresh fruits and vegetables, whole grains, lean proteins, low-fat dairy, and heart-healthy fats.   Work with your health care provider or diet and nutrition specialist (dietitian) to adjust your eating plan to your individual calorie needs.  This information is not intended to replace advice given to you by your health care provider. Make sure you discuss any questions you have with your health care provider.  Document Released: 05/07/2011 Document Revised: 05/11/2016 Document Reviewed: 05/11/2016  Elsevier Interactive Patient Education  2019 Elsevier Inc.

## 2018-08-10 NOTE — Assessment & Plan Note (Addendum)
Chronic, ongoing. Labs today. Initial BP elevated today- repeat improved, however pt reports increased results at home with DBP in 90's. Increase Lisinopril to 40 MG daily.  Recommend continued diet and exercise plan. Follow-up in 4 weeks for medication change.

## 2018-08-10 NOTE — Assessment & Plan Note (Addendum)
Chronic. Labs today. Pt declines statin today. Will monitor lab results and updated ASCVD risk. Follow-up in 6 months.

## 2018-08-10 NOTE — Progress Notes (Signed)
BP (!) 138/92   Pulse 64   Temp 98.1 F (36.7 C) (Oral)   Ht 6' (1.829 m)   Wt 193 lb (87.5 kg)   SpO2 96%   BMI 26.18 kg/m    Subjective:    Patient ID: Jonathan Roberson, male    DOB: [birth year], 63 y.o.   MRN: [id number]  HPI: Jonathan Roberson is a 63 y.o. male   Chief Complaint  Patient presents with  . Hypertension    f/u  . Hyperlipidemia  . Depression   HYPERTENSION / HYPERLIPIDEMIA Pt reports taking Amlodipine 5 mg BID and Lisinopril two tablets per day- a 10 MG and a 20 MG to equal 30 MG.  No medications for HLD- declines statin use at this time. Pt reports walking twice a day for a total of 4 miles per day and lifts weights three to four days a week.  Satisfied with current treatment? no Duration of hypertension: chronic BP monitoring frequency: rarely BP range: 130-140's/ 90's BP medication side effects: no Duration of hyperlipidemia: chronic Cholesterol medication side effects: no current medications Cholesterol supplements: fish oil Past cholesterol medications: none Aspirin: yes Recent stressors: no Recurrent headaches: no Visual changes: yes- close vision- last eye exam a few years ago- plans to make appt Palpitations: no Dyspnea: no Chest pain: no Lower extremity edema: yes- minimal from amlodipine Dizzy/lightheaded: no   The 10-year ASCVD risk score [name], et al., 2013) is: 10%   Values used to calculate the score:     Age: 72 years     Sex: Male     Is Non-Hispanic African American: No     Diabetic: No     Tobacco smoker: No     Systolic Blood Pressure: 409 mmHg     Is BP treated: Yes     HDL Cholesterol: 74 mg/dL     Total Cholesterol: 192 mg/dL   DEPRESSION Taking Paxil 20mg  daily. Pt reports intermittent depressive and anxiety symptoms, worse when he does not exercise. Discussed with patient the option to increase medication dose- he does not feel this is needed at this time. Encouraged pt to let me know if symptoms worsen or if he  feels dose adjustment needs to be made.    Mood status: controlled   Satisfied with current treatment?: yes Symptom severity: mild  Duration of current treatment : chronic Side effects: no Medication compliance: good compliance Psychotherapy/counseling: yes in the past- after father's suicide  Depressed mood: "on and off" when I don't exercise it is worse Anxious mood: "occassionally" Anhedonia: no Significant weight loss or gain: no Insomnia: no  Fatigue: no Feelings of worthlessness or guilt: no Impaired concentration/indecisiveness: no Suicidal ideations: no Hopelessness: no Crying spells: no Depression screen [facility] 2/9 08/10/2018 12/21/2017 06/29/2017 12/23/2016 06/24/2016  Decreased Interest 1 0 0 1 0  Down, Depressed, Hopeless 0 0 0 1 1  PHQ - 2 Score 1 0 0 2 1  Altered sleeping 0 0 0 0 -  Tired, decreased energy 1 0 1 1 -  Change in appetite 0 0 0 0 -  Feeling bad or failure about yourself  0 0 0 1 -  Trouble concentrating 0 0 0 0 -  Moving slowly or fidgety/restless 0 0 1 0 -  Suicidal thoughts 0 0 0 0 -  PHQ-9 Score 2 0 2 4 -  Difficult doing work/chores Not difficult at all - - - -   Relevant past medical, surgical, family  and social history reviewed and updated as indicated. Interim medical history since our last visit reviewed. Allergies and medications reviewed and updated.  Review of Systems  Per HPI unless specifically indicated above     Objective:    BP (!) 138/92   Pulse 64   Temp 98.1 F (36.7 C) (Oral)   Ht 6' (1.829 m)   Wt 193 lb (87.5 kg)   SpO2 96%   BMI 26.18 kg/m   Wt Readings from Last 3 Encounters:  08/10/18 193 lb (87.5 kg)  05/11/18 193 lb (87.5 kg)  12/21/17 194 lb 8 oz (88.2 kg)    Physical Exam Vitals signs and nursing note reviewed.  Constitutional:      Appearance: He is well-developed.  HENT:     Head: Normocephalic and atraumatic.     Right Ear: Hearing normal. No drainage.     Left Ear: Hearing normal. No drainage.      Mouth/Throat:     Pharynx: Uvula midline.  Eyes:     General: Lids are normal.        Right eye: No discharge.        Left eye: No discharge.     Conjunctiva/sclera: Conjunctivae normal.     Pupils: Pupils are equal, round, and reactive to light.  Neck:     Musculoskeletal: Normal range of motion and neck supple.     Thyroid: No thyromegaly.     Vascular: No carotid bruit or JVD.     Trachea: Trachea normal.  Cardiovascular:     Rate and Rhythm: Normal rate and regular rhythm.     Heart sounds: Normal heart sounds, S1 normal and S2 normal. No murmur. No gallop.      Comments: Non-pitting bilateral LE edema.  Pulmonary:     Effort: Pulmonary effort is normal.     Breath sounds: Normal breath sounds.  Abdominal:     General: Bowel sounds are normal.     Palpations: Abdomen is soft. There is no hepatomegaly or splenomegaly.  Musculoskeletal: Normal range of motion.     Right lower leg: Edema present.     Left lower leg: Edema present.  Skin:    General: Skin is warm and dry.     Capillary Refill: Capillary refill takes less than 2 seconds.     Findings: No rash.  Neurological:     Mental Status: He is alert and oriented to person, place, and time.     Deep Tendon Reflexes: Reflexes are normal and symmetric.  Psychiatric:        Mood and Affect: Mood normal.        Behavior: Behavior normal.        Thought Content: Thought content normal.        Judgment: Judgment normal.    Results for orders placed or performed in visit on 06/29/17  Comprehensive metabolic panel  Result Value Ref Range   Glucose 82 65 - 99 mg/dL   BUN 13 8 - 27 mg/dL   Creatinine, Ser 0.83 0.76 - 1.27 mg/dL   GFR calc non Af Amer 95 >59 mL/min/1.73   GFR calc Af Amer 110 >59 mL/min/1.73   BUN/Creatinine Ratio 16 10 - 24   Sodium 139 134 - 144 mmol/L   Potassium 3.9 3.5 - 5.2 mmol/L   Chloride 96 96 - 106 mmol/L   CO2 24 20 - 29 mmol/L   Calcium 8.8 8.6 - 10.2 mg/dL   Total Protein 7.4 6.0 -  8.5  g/dL   Albumin 4.2 3.6 - 4.8 g/dL   Globulin, Total 3.2 1.5 - 4.5 g/dL   Albumin/Globulin Ratio 1.3 1.2 - 2.2   Bilirubin Total 0.7 0.0 - 1.2 mg/dL   Alkaline Phosphatase 66 39 - 117 IU/L   AST 21 0 - 40 IU/L   ALT 21 0 - 44 IU/L  CBC with Differential/Platelet  Result Value Ref Range   WBC 3.9 3.4 - 10.8 x10E3/uL   RBC 4.19 4.14 - 5.80 x10E6/uL   Hemoglobin 13.0 13.0 - 17.7 g/dL   Hematocrit 38.7 37.5 - 51.0 %   MCV 92 79 - 97 fL   MCH 31.0 26.6 - 33.0 pg   MCHC 33.6 31.5 - 35.7 g/dL   RDW 13.5 12.3 - 15.4 %   Platelets 191 150 - 379 x10E3/uL   Neutrophils 46 Not Estab. %   Lymphs 33 Not Estab. %   Monocytes 10 Not Estab. %   Eos 10 Not Estab. %   Basos 1 Not Estab. %   Neutrophils Absolute 1.8 1.4 - 7.0 x10E3/uL   Lymphocytes Absolute 1.3 0.7 - 3.1 x10E3/uL   Monocytes Absolute 0.4 0.1 - 0.9 x10E3/uL   EOS (ABSOLUTE) 0.4 0.0 - 0.4 x10E3/uL   Basophils Absolute 0.0 0.0 - 0.2 x10E3/uL   Immature Granulocytes 0 Not Estab. %   Immature Grans (Abs) 0.0 0.0 - 0.1 x10E3/uL  Lipid Panel w/o Chol/HDL Ratio  Result Value Ref Range   Cholesterol, Total 192 100 - 199 mg/dL   Triglycerides 125 0 - 149 mg/dL   HDL 74 >39 mg/dL   VLDL Cholesterol Cal 25 5 - 40 mg/dL   LDL Calculated 93 0 - 99 mg/dL  TSH  Result Value Ref Range   TSH 0.947 0.450 - 4.500 uIU/mL  PSA  Result Value Ref Range   Prostate Specific Ag, Serum 2.6 0.0 - 4.0 ng/mL   PSA and TSH offered to patient today. He does not wish to proceed with these labs at this time.     Assessment & Plan:   Problem List Items Addressed This Visit      Cardiovascular and Mediastinum   Hypertension    Chronic, ongoing. Labs today. Initial BP elevated today- repeat improved, however pt reports increased results at home with DBP in 90's. Increase Lisinopril to 40 MG daily.  Recommend continued diet and exercise plan. Follow-up in 4 weeks for medication change.       Relevant Medications   lisinopril (PRINIVIL,ZESTRIL) 40 MG  tablet   Other Relevant Orders   Comprehensive metabolic panel   CBC with Differential/Platelet     Other   Hyperlipidemia    Chronic. Labs today. Pt declines statin today. Will monitor lab results and updated ASCVD risk. Follow-up in 6 months.       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 Pt to inform provider if symptoms worsen or if he feels medication adjustment is needed. Continued Paxil 20 mg today. Follow-up in 6 months.           Follow up plan: Return in about 4 weeks (around 09/07/2018) for HTN .   NOTE WRITTEN BY [employer] DNP STUDENT.  ASSESSMENT AND PLAN OF CARE REVIEWED WITH STUDENT, AGREE WITH ABOVE FINDINGS AND PLAN.

## 2018-08-11 LAB — COMPREHENSIVE METABOLIC PANEL
ALBUMIN: 4.5 g/dL (ref 3.8–4.8)
ALT: 17 IU/L (ref 0–44)
AST: 23 IU/L (ref 0–40)
Albumin/Globulin Ratio: 1.6 (ref 1.2–2.2)
Alkaline Phosphatase: 61 IU/L (ref 39–117)
BILIRUBIN TOTAL: 0.9 mg/dL (ref 0.0–1.2)
BUN / CREAT RATIO: 14 (ref 10–24)
BUN: 10 mg/dL (ref 8–27)
CALCIUM: 9 mg/dL (ref 8.6–10.2)
CHLORIDE: 92 mmol/L — AB (ref 96–106)
CO2: 26 mmol/L (ref 20–29)
CREATININE: 0.7 mg/dL — AB (ref 0.76–1.27)
GFR calc non Af Amer: 101 mL/min/{1.73_m2} (ref >59)
GFR, EST AFRICAN AMERICAN: 117 mL/min/{1.73_m2} (ref >59)
Globulin, Total: 2.9 g/dL (ref 1.5–4.5)
Glucose: 70 mg/dL (ref 65–99)
Potassium: 3.8 mmol/L (ref 3.5–5.2)
Sodium: 134 mmol/L (ref 134–144)
TOTAL PROTEIN: 7.4 g/dL (ref 6.0–8.5)

## 2018-08-11 LAB — CBC WITH DIFFERENTIAL/PLATELET
BASOS ABS: 0 10*3/uL (ref 0.0–0.2)
Basos: 1 %
EOS (ABSOLUTE): 0.2 10*3/uL (ref 0.0–0.4)
EOS: 6 %
HEMATOCRIT: 40.1 % (ref 37.5–51.0)
HEMOGLOBIN: 13.9 g/dL (ref 13.0–17.7)
IMMATURE GRANS (ABS): 0 10*3/uL (ref 0.0–0.1)
Immature Granulocytes: 0 %
LYMPHS ABS: 1.1 10*3/uL (ref 0.7–3.1)
LYMPHS: 28 %
MCH: 31.8 pg (ref 26.6–33.0)
MCHC: 34.7 g/dL (ref 31.5–35.7)
MCV: 92 fL (ref 79–97)
MONOCYTES: 14 %
Monocytes Absolute: 0.5 10*3/uL (ref 0.1–0.9)
NEUTROS ABS: 2 10*3/uL (ref 1.4–7.0)
Neutrophils: 51 %
Platelets: 199 10*3/uL (ref 150–450)
RBC: 4.37 x10E6/uL (ref 4.14–5.80)
RDW: 11.9 % (ref 11.6–15.4)
WBC: 3.9 10*3/uL (ref 3.4–10.8)

## 2018-08-11 LAB — LIPID PANEL W/O CHOL/HDL RATIO
CHOLESTEROL TOTAL: 229 mg/dL — AB (ref 100–199)
HDL: 80 mg/dL (ref >39)
LDL CALC: 137 mg/dL — AB (ref 0–99)
TRIGLYCERIDES: 61 mg/dL (ref 0–149)
VLDL CHOLESTEROL CAL: 12 mg/dL (ref 5–40)

## 2018-08-12 ENCOUNTER — Telehealth: Payer: Self-pay | Admitting: Nurse Practitioner

## 2018-08-12 DIAGNOSIS — J301 Allergic rhinitis due to pollen: Secondary | ICD-10-CM | POA: Diagnosis not present

## 2018-08-12 NOTE — Telephone Encounter (Signed)
Okay, I have switched this to increase to 40 MG based on his recent BP readings.  The script is at pharmacy.  Please make him aware and aware this is only one pill.  He will not have to take 2.

## 2018-08-12 NOTE — Telephone Encounter (Signed)
Copied from [place] [id number]. Topic: Quick Communication - See Telephone Encounter >> Aug 12, 2018 12:00 PM Vernona Rieger wrote: CRM for notification. See Telephone encounter for: 08/12/18. Patient was advised to let Jolene know how many milligrams he is taking of Lisinopril - he said he takes (2) 10mg  tablets mid day.

## 2018-08-12 NOTE — Telephone Encounter (Signed)
Spoke with the patient. Explained that he only needs to take 1 pill instead of 2 pills as the script was changed to 40mg .

## 2018-08-24 ENCOUNTER — Other Ambulatory Visit: Payer: Self-pay | Admitting: Unknown Physician Specialty

## 2018-09-02 MED ORDER — AMLODIPINE BESYLATE 5 MG PO TABS: 10.0000 mg | ORAL_TABLET | Freq: Every day | ORAL | 4 refills | Status: DC

## 2018-09-02 NOTE — Telephone Encounter (Signed)
Amlodipine script sent.

## 2018-09-02 NOTE — Telephone Encounter (Signed)
Copied from [place] [id number]. Topic: General - Other >> Sep 02, 2018 11:46 AM Antonieta Iba C wrote: Reason for CRM:  pt Is calling in to request a refill on his Amlodipine Rx. Advised pt that I am showing refills on file. [facility] are not [facility]ills.   Please assist.

## 2018-09-05 ENCOUNTER — Telehealth: Payer: Self-pay | Admitting: Nurse Practitioner

## 2018-09-05 NOTE — Telephone Encounter (Signed)
Called pt to see about doing virtual visit no answer left voicemail

## 2018-09-07 ENCOUNTER — Other Ambulatory Visit: Payer: Self-pay | Admitting: Nurse Practitioner

## 2018-09-07 ENCOUNTER — Encounter: Payer: Self-pay | Admitting: Nurse Practitioner

## 2018-09-07 ENCOUNTER — Telehealth: Payer: Self-pay | Admitting: Nurse Practitioner

## 2018-09-07 ENCOUNTER — Other Ambulatory Visit: Payer: Self-pay

## 2018-09-07 ENCOUNTER — Ambulatory Visit (INDEPENDENT_AMBULATORY_CARE_PROVIDER_SITE_OTHER): Payer: BLUE CROSS/BLUE SHIELD | Admitting: Nurse Practitioner

## 2018-09-07 DIAGNOSIS — I10 Essential (primary) hypertension: Secondary | ICD-10-CM

## 2018-09-07 MED ORDER — AMLODIPINE BESYLATE 5 MG PO TABS: 10.0000 mg | ORAL_TABLET | Freq: Every day | ORAL | 4 refills | Status: DC

## 2018-09-07 NOTE — Telephone Encounter (Signed)
Got it, complete.

## 2018-09-07 NOTE — Telephone Encounter (Signed)
Copied from [place] [id number]. Topic: Quick Communication - Rx Refill/Question >> Sep 07, 2018  4:20 PM Celene Kras A wrote: Medication: amLODipine (NORVASC) 5 MG tablet   Has the patient contacted their pharmacy? Yes.  Pt called stating Pharmacy will not refill prescription due to pt having no more refills at this time. Please advise.  (Agent: If no, request that the patient contact the pharmacy for the refill.) (Agent: If yes, when and what did the pharmacy advise?)  Preferred Pharmacy (with phone number or street name): [facility], [place] [address] Phone: [phone] Fax: [phone] Not a 24 hour pharmacy; exact hours not known.    Agent: Please be advised that RX refills may take up to 3 business days. We ask that you follow-up with your pharmacy.

## 2018-09-07 NOTE — Assessment & Plan Note (Addendum)
Chronic, ongoing.  BP at goal on home readings.  Continue current medication regimen.  Recommend continued focus on DASH diet and exercise.  Educated on importance of slow position changes.  Follow-up in 3 months.

## 2018-09-07 NOTE — Patient Instructions (Signed)
DASH Eating Plan  DASH stands for "Dietary Approaches to Stop Hypertension." The DASH eating plan is a healthy eating plan that has been shown to reduce high blood pressure (hypertension). It may also reduce your risk for type 2 diabetes, heart disease, and stroke. The DASH eating plan may also help with weight loss.  What are tips for following this plan?    General guidelines   Avoid eating more than 2,300 mg (milligrams) of salt (sodium) a day. If you have hypertension, you may need to reduce your sodium intake to 1,500 mg a day.   Limit alcohol intake to no more than 1 drink a day for nonpregnant women and 2 drinks a day for men. One drink equals 12 oz of beer, 5 oz of wine, or 1 oz of hard liquor.   Work with your health care provider to maintain a healthy body weight or to lose weight. Ask what an ideal weight is for you.   Get at least 30 minutes of exercise that causes your heart to beat faster (aerobic exercise) most days of the week. Activities may include walking, swimming, or biking.   Work with your health care provider or diet and nutrition specialist (dietitian) to adjust your eating plan to your individual calorie needs.  Reading food labels     Check food labels for the amount of sodium per serving. Choose foods with less than 5 percent of the Daily Value of sodium. Generally, foods with less than 300 mg of sodium per serving fit into this eating plan.   To find whole grains, look for the word "whole" as the first word in the ingredient list.  Shopping   Buy products labeled as "low-sodium" or "no salt added."   Buy fresh foods. Avoid canned foods and premade or frozen meals.  Cooking   Avoid adding salt when cooking. Use salt-free seasonings or herbs instead of table salt or sea salt. Check with your health care provider or pharmacist before using salt substitutes.   Do not fry foods. Cook foods using healthy methods such as baking, boiling, grilling, and broiling instead.   Cook with  heart-healthy oils, such as olive, canola, soybean, or sunflower oil.  Meal planning   Eat a balanced diet that includes:  ? 5 or more servings of fruits and vegetables each day. At each meal, try to fill half of your plate with fruits and vegetables.  ? Up to 6-8 servings of whole grains each day.  ? Less than 6 oz of lean meat, poultry, or fish each day. A 3-oz serving of meat is about the same size as a deck of cards. One egg equals 1 oz.  ? 2 servings of low-fat dairy each day.  ? A serving of nuts, seeds, or beans 5 times each week.  ? Heart-healthy fats. Healthy fats called Omega-3 fatty acids are found in foods such as flaxseeds and coldwater fish, like sardines, salmon, and mackerel.   Limit how much you eat of the following:  ? Canned or prepackaged foods.  ? Food that is high in trans fat, such as fried foods.  ? Food that is high in saturated fat, such as fatty meat.  ? Sweets, desserts, sugary drinks, and other foods with added sugar.  ? Full-fat dairy products.   Do not salt foods before eating.   Try to eat at least 2 vegetarian meals each week.   Eat more home-cooked food and less restaurant, buffet, and fast food.     When eating at a restaurant, ask that your food be prepared with less salt or no salt, if possible.  What foods are recommended?  The items listed may not be a complete list. Talk with your dietitian about what dietary choices are best for you.  Grains  Whole-grain or whole-wheat bread. Whole-grain or whole-wheat pasta. Brown rice. Oatmeal. Quinoa. Bulgur. Whole-grain and low-sodium cereals. Pita bread. Low-fat, low-sodium crackers. Whole-wheat flour tortillas.  Vegetables  Fresh or frozen vegetables (raw, steamed, roasted, or grilled). Low-sodium or reduced-sodium tomato and vegetable juice. Low-sodium or reduced-sodium tomato sauce and tomato paste. Low-sodium or reduced-sodium canned vegetables.  Fruits  All fresh, dried, or frozen fruit. Canned fruit in natural juice (without  added sugar).  Meat and other protein foods  Skinless chicken or turkey. Ground chicken or turkey. Pork with fat trimmed off. Fish and seafood. Egg whites. Dried beans, peas, or lentils. Unsalted nuts, nut butters, and seeds. Unsalted canned beans. Lean cuts of beef with fat trimmed off. Low-sodium, lean deli meat.  Dairy  Low-fat (1%) or fat-free (skim) milk. Fat-free, low-fat, or reduced-fat cheeses. Nonfat, low-sodium ricotta or cottage cheese. Low-fat or nonfat yogurt. Low-fat, low-sodium cheese.  Fats and oils  Soft margarine without trans fats. Vegetable oil. Low-fat, reduced-fat, or light mayonnaise and salad dressings (reduced-sodium). Canola, safflower, olive, soybean, and sunflower oils. Avocado.  Seasoning and other foods  Herbs. Spices. Seasoning mixes without salt. Unsalted popcorn and pretzels. Fat-free sweets.  What foods are not recommended?  The items listed may not be a complete list. Talk with your dietitian about what dietary choices are best for you.  Grains  Baked goods made with fat, such as croissants, muffins, or some breads. Dry pasta or rice meal packs.  Vegetables  Creamed or fried vegetables. Vegetables in a cheese sauce. Regular canned vegetables (not low-sodium or reduced-sodium). Regular canned tomato sauce and paste (not low-sodium or reduced-sodium). Regular tomato and vegetable juice (not low-sodium or reduced-sodium). Pickles. Olives.  Fruits  Canned fruit in a light or heavy syrup. Fried fruit. Fruit in cream or butter sauce.  Meat and other protein foods  Fatty cuts of meat. Ribs. Fried meat. Bacon. Sausage. Bologna and other processed lunch meats. Salami. Fatback. Hotdogs. Bratwurst. Salted nuts and seeds. Canned beans with added salt. Canned or smoked fish. Whole eggs or egg yolks. Chicken or turkey with skin.  Dairy  Whole or 2% milk, cream, and half-and-half. Whole or full-fat cream cheese. Whole-fat or sweetened yogurt. Full-fat cheese. Nondairy creamers. Whipped toppings.  Processed cheese and cheese spreads.  Fats and oils  Butter. Stick margarine. Lard. Shortening. Ghee. Bacon fat. Tropical oils, such as coconut, palm kernel, or palm oil.  Seasoning and other foods  Salted popcorn and pretzels. Onion salt, garlic salt, seasoned salt, table salt, and sea salt. Worcestershire sauce. Tartar sauce. Barbecue sauce. Teriyaki sauce. Soy sauce, including reduced-sodium. Steak sauce. Canned and packaged gravies. Fish sauce. Oyster sauce. Cocktail sauce. Horseradish that you find on the shelf. Ketchup. Mustard. Meat flavorings and tenderizers. Bouillon cubes. Hot sauce and Tabasco sauce. Premade or packaged marinades. Premade or packaged taco seasonings. Relishes. Regular salad dressings.  Where to find more information:   National Heart, Lung, and Blood Institute: www.nhlbi.nih.gov   American Heart Association: www.heart.org  Summary   The DASH eating plan is a healthy eating plan that has been shown to reduce high blood pressure (hypertension). It may also reduce your risk for type 2 diabetes, heart disease, and stroke.   With the   DASH eating plan, you should limit salt (sodium) intake to 2,300 mg a day. If you have hypertension, you may need to reduce your sodium intake to 1,500 mg a day.   When on the DASH eating plan, aim to eat more fresh fruits and vegetables, whole grains, lean proteins, low-fat dairy, and heart-healthy fats.   Work with your health care provider or diet and nutrition specialist (dietitian) to adjust your eating plan to your individual calorie needs.  This information is not intended to replace advice given to you by your health care provider. Make sure you discuss any questions you have with your health care provider.  Document Released: 05/07/2011 Document Revised: 05/11/2016 Document Reviewed: 05/11/2016  Elsevier Interactive Patient Education  2019 Elsevier Inc.

## 2018-09-07 NOTE — Progress Notes (Addendum)
BP 137/88   Temp 98 F (36.7 C) (Oral)   Ht 6\' 1"  (1.854 m)   Wt 185 lb (83.9 kg)   BMI 24.41 kg/m    Subjective:    Patient ID: Jonathan Roberson, male    DOB: [birth year], 63 y.o.   MRN: [id number]  HPI: Jonathan Roberson is a 63 y.o. male  Chief Complaint  Patient presents with  . Hypertension    4w f/u    . This visit was completed via telephone due to the restrictions of the COVID-19 pandemic. All issues as above were discussed and addressed but no physical exam was performed. If it was felt that the patient should be evaluated in the office, they were directed there. The patient verbally consented to this visit. Patient was unable to complete an audio/visual visit due to Technical difficulties,Lack of internet. Due to the catastrophic nature of the COVID-19 pandemic, this visit was done through audio contact only. . Location of the patient: home . Location of the provider: work . Those involved with this call:  . Provider: Marnee Guarneri, DNP . CMA: Lesle Chris, Progress . Front Desk/Registration: Jill Side  . Time spent on call: 15  minutes on the phone discussing health concerns   HYPERTENSION Current medications include Amlodipine 10 MG daily and Lisinopril 40 MG daily (increased to 40 MG at last visit 4 weeks ago).  Endorses increased stress at this time due to his company shutting down with COVID 19.  States over all he is feeling "well".  Denies any cough with Lisinopril.   Hypertension status: stable  Satisfied with current treatment? yes Duration of hypertension: chronic BP monitoring frequency:  daily BP range: 120-130/80 range BP medication side effects:  no Medication compliance: good compliance Aspirin: no Recurrent headaches: no Visual changes: no Palpitations: no Dyspnea: no Chest pain: no Lower extremity edema: no Dizzy/lightheaded: occasional with position changes  Relevant past medical, surgical, family and social history reviewed and updated as  indicated. Interim medical history since our last visit reviewed. Allergies and medications reviewed and updated.  Review of Systems  Constitutional: Negative for activity change, diaphoresis, fatigue and fever.  Respiratory: Negative for cough, chest tightness, shortness of breath and wheezing.   Cardiovascular: Negative for chest pain, palpitations and leg swelling.  Gastrointestinal: Negative for abdominal distention, abdominal pain, constipation, diarrhea, nausea and vomiting.  Endocrine: Negative for cold intolerance, heat intolerance, polydipsia, polyphagia and polyuria.  Musculoskeletal: Negative.   Skin: Negative.   Neurological: Negative for dizziness, syncope, weakness, light-headedness, numbness and headaches.  Psychiatric/Behavioral: Negative.     Per HPI unless specifically indicated above     Objective:    BP 137/88   Temp 98 F (36.7 C) (Oral)   Ht 6\' 1"  (1.854 m)   Wt 185 lb (83.9 kg)   BMI 24.41 kg/m   Wt Readings from Last 3 Encounters:  09/07/18 185 lb (83.9 kg)  08/10/18 193 lb (87.5 kg)  05/11/18 193 lb (87.5 kg)    Physical Exam  No physical exam performed due to telephone only visit  Results for orders placed or performed in visit on 08/10/18  Comprehensive metabolic panel  Result Value Ref Range   Glucose 70 65 - 99 mg/dL   BUN 10 8 - 27 mg/dL   Creatinine, Ser 0.70 (L) 0.76 - 1.27 mg/dL   GFR calc non Af Amer 101 >59 mL/min/1.73   GFR calc Af Amer 117 >59 mL/min/1.73   BUN/Creatinine Ratio 14 10 -  24   Sodium 134 134 - 144 mmol/L   Potassium 3.8 3.5 - 5.2 mmol/L   Chloride 92 (L) 96 - 106 mmol/L   CO2 26 20 - 29 mmol/L   Calcium 9.0 8.6 - 10.2 mg/dL   Total Protein 7.4 6.0 - 8.5 g/dL   Albumin 4.5 3.8 - 4.8 g/dL   Globulin, Total 2.9 1.5 - 4.5 g/dL   Albumin/Globulin Ratio 1.6 1.2 - 2.2   Bilirubin Total 0.9 0.0 - 1.2 mg/dL   Alkaline Phosphatase 61 39 - 117 IU/L   AST 23 0 - 40 IU/L   ALT 17 0 - 44 IU/L  CBC with  Differential/Platelet  Result Value Ref Range   WBC 3.9 3.4 - 10.8 x10E3/uL   RBC 4.37 4.14 - 5.80 x10E6/uL   Hemoglobin 13.9 13.0 - 17.7 g/dL   Hematocrit 40.1 37.5 - 51.0 %   MCV 92 79 - 97 fL   MCH 31.8 26.6 - 33.0 pg   MCHC 34.7 31.5 - 35.7 g/dL   RDW 11.9 11.6 - 15.4 %   Platelets 199 150 - 450 x10E3/uL   Neutrophils 51 Not Estab. %   Lymphs 28 Not Estab. %   Monocytes 14 Not Estab. %   Eos 6 Not Estab. %   Basos 1 Not Estab. %   Neutrophils Absolute 2.0 1.4 - 7.0 x10E3/uL   Lymphocytes Absolute 1.1 0.7 - 3.1 x10E3/uL   Monocytes Absolute 0.5 0.1 - 0.9 x10E3/uL   EOS (ABSOLUTE) 0.2 0.0 - 0.4 x10E3/uL   Basophils Absolute 0.0 0.0 - 0.2 x10E3/uL   Immature Granulocytes 0 Not Estab. %   Immature Grans (Abs) 0.0 0.0 - 0.1 x10E3/uL  Lipid Panel w/o Chol/HDL Ratio  Result Value Ref Range   Cholesterol, Total 229 (H) 100 - 199 mg/dL   Triglycerides 61 0 - 149 mg/dL   HDL 80 >39 mg/dL   VLDL Cholesterol Cal 12 5 - 40 mg/dL   LDL Calculated 137 (H) 0 - 99 mg/dL      Assessment & Plan:   Problem List Items Addressed This Visit      Cardiovascular and Mediastinum   Hypertension    Chronic, ongoing.  BP at goal on home readings.  Continue current medication regimen.  Recommend continued focus on DASH diet and exercise.  Educated on importance of slow position changes.  Follow-up in 3 months.         I discussed the assessment and treatment plan with the patient. The patient was provided an opportunity to ask questions and all were answered. The patient agreed with the plan and demonstrated an understanding of the instructions.   The patient was advised to call back or seek an in-person evaluation if the symptoms worsen or if the condition fails to improve as anticipated.   I provided 15 minutes of time during this encounter.  Follow up plan: Return in about 3 months (around 12/07/2018) for HTN/HLD, mood.

## 2018-09-07 NOTE — Telephone Encounter (Signed)
Pt stated he would like to have this medication sent to [facility], as he is about to run out and [facility].

## 2018-09-07 NOTE — Telephone Encounter (Signed)
Please alert patient that refill was sent 09/02/2018 and should be with [facility].

## 2018-10-05 DIAGNOSIS — J301 Allergic rhinitis due to pollen: Secondary | ICD-10-CM | POA: Diagnosis not present

## 2018-10-07 DIAGNOSIS — J301 Allergic rhinitis due to pollen: Secondary | ICD-10-CM | POA: Diagnosis not present

## 2018-10-14 DIAGNOSIS — J301 Allergic rhinitis due to pollen: Secondary | ICD-10-CM | POA: Diagnosis not present

## 2018-10-21 DIAGNOSIS — J301 Allergic rhinitis due to pollen: Secondary | ICD-10-CM | POA: Diagnosis not present

## 2018-10-28 DIAGNOSIS — J301 Allergic rhinitis due to pollen: Secondary | ICD-10-CM | POA: Diagnosis not present

## 2018-11-04 DIAGNOSIS — J301 Allergic rhinitis due to pollen: Secondary | ICD-10-CM | POA: Diagnosis not present

## 2018-11-11 DIAGNOSIS — J301 Allergic rhinitis due to pollen: Secondary | ICD-10-CM | POA: Diagnosis not present

## 2018-11-18 DIAGNOSIS — J301 Allergic rhinitis due to pollen: Secondary | ICD-10-CM | POA: Diagnosis not present

## 2018-12-09 ENCOUNTER — Encounter: Payer: Self-pay | Admitting: Nurse Practitioner

## 2018-12-09 ENCOUNTER — Other Ambulatory Visit: Payer: Self-pay

## 2018-12-09 ENCOUNTER — Ambulatory Visit (INDEPENDENT_AMBULATORY_CARE_PROVIDER_SITE_OTHER): Payer: BC Managed Care – PPO | Admitting: Nurse Practitioner

## 2018-12-09 VITALS — BP 145/88 | Ht 73.0 in | Wt 185.0 lb

## 2018-12-09 DIAGNOSIS — F3342 Major depressive disorder, recurrent, in full remission: Secondary | ICD-10-CM

## 2018-12-09 DIAGNOSIS — E7800 Pure hypercholesterolemia, unspecified: Secondary | ICD-10-CM | POA: Diagnosis not present

## 2018-12-09 DIAGNOSIS — I10 Essential (primary) hypertension: Secondary | ICD-10-CM | POA: Diagnosis not present

## 2018-12-09 DIAGNOSIS — J301 Allergic rhinitis due to pollen: Secondary | ICD-10-CM | POA: Diagnosis not present

## 2018-12-09 NOTE — Assessment & Plan Note (Signed)
Chronic, stable.  Continue current medication regimen. Denies SI/HI.  Follow-up in 6 months.

## 2018-12-09 NOTE — Progress Notes (Signed)
BP (!) 145/88 (BP Location: Left Arm, Patient Position: Sitting, Cuff Size: Normal)   Ht 6\' 1"  (1.854 m)   Wt 185 lb (83.9 kg)   BMI 24.41 kg/m    Subjective:    Patient ID: Jonathan Roberson, male    DOB: 02/11/1956, 63 y.o.   MRN: [id number]  HPI: Jonathan Roberson is a 63 y.o. male  Chief Complaint  Patient presents with  . Hypertension    No complaints.   . Hyperlipidemia  . Depression    . This visit was completed via telephone due to the restrictions of the COVID-19 pandemic. All issues as above were discussed and addressed but no physical exam was performed. If it was felt that the patient should be evaluated in the office, they were directed there. The patient verbally consented to this visit. Patient was unable to complete an audio/visual visit due to Lack of equipment. Due to the catastrophic nature of the COVID-19 pandemic, this visit was done through audio contact only. . Location of the patient: work . Location of the provider: home . Those involved with this call:  . Provider: Marnee Guarneri, DNP . CMA: Merilyn Baba, CMA . Front Desk/Registration: Jill Side  . Time spent on call: 15 minutes on the phone discussing health concerns. 10 minutes total spent in review of patient's record and preparation of their chart.  . I verified patient identity using two factors (patient name and date of birth). Patient consents verbally to being seen via telemedicine visit today.    HYPERTENSION / HYPERLIPIDEMIA Continues on Amlodipine 10 MG,  Lisinopril 40 MG, and no current statin.  Is back at work at this time, just starting to come back.   Satisfied with current treatment? yes Duration of hypertension: chronic BP monitoring frequency: daily BP range: 130-140/80-90 BP medication side effects: no Duration of hyperlipidemia: chronic Cholesterol medication side effects: no Cholesterol supplements: none Medication compliance: good compliance Aspirin: no Recent stressors: no  Recurrent headaches: no Visual changes: no Palpitations: no Dyspnea: no Chest pain: no Lower extremity edema: no Dizzy/lightheaded: no   The 10-year ASCVD risk score [name], et al., 2013) is: 12%   Values used to calculate the score:     Age: 13 years     Sex: Male     Is Non-Hispanic African American: No     Diabetic: No     Tobacco smoker: No     Systolic Blood Pressure: 256 mmHg     Is BP treated: Yes     HDL Cholesterol: 80 mg/dL     Total Cholesterol: 229 mg/dL  DEPRESSION Continues on Paxil 20 MG daily. Mood status: stable Satisfied with current treatment?: yes Symptom severity: mild  Duration of current treatment : chronic Side effects: no Medication compliance: good compliance Psychotherapy/counseling: none Depressed mood: no Anxious mood: no Anhedonia: no Significant weight loss or gain: no Insomnia: none Fatigue: occasional Feelings of worthlessness or guilt: no Impaired concentration/indecisiveness: no Suicidal ideations: no Hopelessness: no Crying spells: no Depression screen [facility] 2/9 12/09/2018 08/10/2018 12/21/2017 06/29/2017 12/23/2016  Decreased Interest 0 1 0 0 1  Down, Depressed, Hopeless 0 0 0 0 1  PHQ - 2 Score 0 1 0 0 2  Altered sleeping 0 0 0 0 0  Tired, decreased energy 1 1 0 1 1  Change in appetite 0 0 0 0 0  Feeling bad or failure about yourself  0 0 0 0 1  Trouble concentrating 0 0 0  0 0  Moving slowly or fidgety/restless 0 0 0 1 0  Suicidal thoughts 0 0 0 0 0  PHQ-9 Score 1 2 0 2 4  Difficult doing work/chores Not difficult at all Not difficult at all - - -    Relevant past medical, surgical, family and social history reviewed and updated as indicated. Interim medical history since our last visit reviewed. Allergies and medications reviewed and updated.  Review of Systems  Constitutional: Negative for activity change, diaphoresis, fatigue and fever.  Respiratory: Negative for cough, chest tightness, shortness of breath and  wheezing.   Cardiovascular: Negative for chest pain, palpitations and leg swelling.  Gastrointestinal: Negative for abdominal distention, abdominal pain, constipation, diarrhea, nausea and vomiting.  Endocrine: Negative for cold intolerance, heat intolerance, polydipsia, polyphagia and polyuria.  Musculoskeletal: Negative.   Skin: Negative.   Neurological: Negative for dizziness, syncope, weakness, light-headedness, numbness and headaches.  Psychiatric/Behavioral: Negative.     Per HPI unless specifically indicated above     Objective:    BP (!) 145/88 (BP Location: Left Arm, Patient Position: Sitting, Cuff Size: Normal)   Ht 6\' 1"  (1.854 m)   Wt 185 lb (83.9 kg)   BMI 24.41 kg/m   Wt Readings from Last 3 Encounters:  12/09/18 185 lb (83.9 kg)  09/07/18 185 lb (83.9 kg)  08/10/18 193 lb (87.5 kg)    Physical Exam   Unable to perform due to telephone visit only.    Results for orders placed or performed in visit on 08/10/18  Comprehensive metabolic panel  Result Value Ref Range   Glucose 70 65 - 99 mg/dL   BUN 10 8 - 27 mg/dL   Creatinine, Ser 0.70 (L) 0.76 - 1.27 mg/dL   GFR calc non Af Amer 101 >59 mL/min/1.73   GFR calc Af Amer 117 >59 mL/min/1.73   BUN/Creatinine Ratio 14 10 - 24   Sodium 134 134 - 144 mmol/L   Potassium 3.8 3.5 - 5.2 mmol/L   Chloride 92 (L) 96 - 106 mmol/L   CO2 26 20 - 29 mmol/L   Calcium 9.0 8.6 - 10.2 mg/dL   Total Protein 7.4 6.0 - 8.5 g/dL   Albumin 4.5 3.8 - 4.8 g/dL   Globulin, Total 2.9 1.5 - 4.5 g/dL   Albumin/Globulin Ratio 1.6 1.2 - 2.2   Bilirubin Total 0.9 0.0 - 1.2 mg/dL   Alkaline Phosphatase 61 39 - 117 IU/L   AST 23 0 - 40 IU/L   ALT 17 0 - 44 IU/L  CBC with Differential/Platelet  Result Value Ref Range   WBC 3.9 3.4 - 10.8 x10E3/uL   RBC 4.37 4.14 - 5.80 x10E6/uL   Hemoglobin 13.9 13.0 - 17.7 g/dL   Hematocrit 40.1 37.5 - 51.0 %   MCV 92 79 - 97 fL   MCH 31.8 26.6 - 33.0 pg   MCHC 34.7 31.5 - 35.7 g/dL   RDW 11.9 11.6  - 15.4 %   Platelets 199 150 - 450 x10E3/uL   Neutrophils 51 Not Estab. %   Lymphs 28 Not Estab. %   Monocytes 14 Not Estab. %   Eos 6 Not Estab. %   Basos 1 Not Estab. %   Neutrophils Absolute 2.0 1.4 - 7.0 x10E3/uL   Lymphocytes Absolute 1.1 0.7 - 3.1 x10E3/uL   Monocytes Absolute 0.5 0.1 - 0.9 x10E3/uL   EOS (ABSOLUTE) 0.2 0.0 - 0.4 x10E3/uL   Basophils Absolute 0.0 0.0 - 0.2 x10E3/uL   Immature Granulocytes 0  Not Estab. %   Immature Grans (Abs) 0.0 0.0 - 0.1 x10E3/uL  Lipid Panel w/o Chol/HDL Ratio  Result Value Ref Range   Cholesterol, Total 229 (H) 100 - 199 mg/dL   Triglycerides 61 0 - 149 mg/dL   HDL 80 >39 mg/dL   VLDL Cholesterol Cal 12 5 - 40 mg/dL   LDL Calculated 137 (H) 0 - 99 mg/dL      Assessment & Plan:   Problem List Items Addressed This Visit      Cardiovascular and Mediastinum   Hypertension    Chronic, ongoing with BP's slightly elevated above goal past months.  Continue current medication regimen.  Patient wishes to focus on diet and exercise prior to addition of more medication.  Consider addition of HCTZ or BB at next visit if continued elevation.  Return in 6 months for annual physical or sooner if continued elevation in BP above goal with lifestyle changes.      Relevant Medications   Aspirin Buf,CaCarb-MgCarb-MgO, 81 MG TABS     Other   Hyperlipidemia    Chronic, ongoing.  He refuses statin at this time and wishes to continue to focus on lifestyle changes.  Discussed risk with current ASCVD score and recommendations for statin.  Will recheck panel in 6 months and if continued elevation continue to recommend statin and educate patient.      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  Continue current medication regimen. Denies SI/HI.  Follow-up in 6 months.          I discussed the assessment and treatment plan with the patient. The patient was  provided an opportunity to ask questions and all were answered. The patient agreed with the plan and demonstrated an understanding of the instructions.   The patient was advised to call back or seek an in-person evaluation if the symptoms worsen or if the condition fails to improve as anticipated.   I provided 15 minutes of time during this encounter.  Follow up plan: Return in about 6 months (around 06/11/2019) for Annual physical.

## 2018-12-09 NOTE — Assessment & Plan Note (Signed)
Chronic, ongoing.  He refuses statin at this time and wishes to continue to focus on lifestyle changes.  Discussed risk with current ASCVD score and recommendations for statin.  Will recheck panel in 6 months and if continued elevation continue to recommend statin and educate patient.

## 2018-12-09 NOTE — Patient Instructions (Signed)

## 2018-12-09 NOTE — Assessment & Plan Note (Signed)
Chronic, ongoing with BP's slightly elevated above goal past months.  Continue current medication regimen.  Patient wishes to focus on diet and exercise prior to addition of more medication.  Consider addition of HCTZ or BB at next visit if continued elevation.  Return in 6 months for annual physical or sooner if continued elevation in BP above goal with lifestyle changes.

## 2018-12-16 DIAGNOSIS — J301 Allergic rhinitis due to pollen: Secondary | ICD-10-CM | POA: Diagnosis not present

## 2018-12-23 DIAGNOSIS — J301 Allergic rhinitis due to pollen: Secondary | ICD-10-CM | POA: Diagnosis not present

## 2019-01-06 DIAGNOSIS — J301 Allergic rhinitis due to pollen: Secondary | ICD-10-CM | POA: Diagnosis not present

## 2019-01-13 DIAGNOSIS — J301 Allergic rhinitis due to pollen: Secondary | ICD-10-CM | POA: Diagnosis not present

## 2019-01-17 DIAGNOSIS — J301 Allergic rhinitis due to pollen: Secondary | ICD-10-CM | POA: Diagnosis not present

## 2019-01-18 ENCOUNTER — Telehealth: Payer: Self-pay

## 2019-01-18 DIAGNOSIS — J301 Allergic rhinitis due to pollen: Secondary | ICD-10-CM | POA: Diagnosis not present

## 2019-01-18 NOTE — Telephone Encounter (Signed)
Return call to patient. No answer. Left VM for patient to return call to the office. Copied from [place] [phone]. Topic: General - Other >> Jan 18, 2019 12:15 PM Jonathan Roberson wrote: Patient would like to notify [name] that he has adjusted his dosage on Paxil and would like her nurse to give Roberson callback

## 2019-01-19 NOTE — Telephone Encounter (Signed)
Called and spoke to patient. Patient stated that he increased his Paxil from 1 tablet a day to 1 1/2 a day. States he is taking 1 tablet 20 mg in the morning and 0.5 tablet 10 mg in the evening. Patient states that he increased the dosage because he feel like it helps him to be more alert and easily weak up in the morning.

## 2019-01-19 NOTE — Telephone Encounter (Signed)
Called patient and left detailed VM (DPR reviewed). Asked patient to return call to the office with any questions about the message.

## 2019-01-19 NOTE — Telephone Encounter (Signed)
Please let patient know this is okay to do.  Max dose is 50 MG daily, so if 30 MG is improving mood it is okay to continue this.  Tell him I appreciate him telling me this, as that is very helpful.

## 2019-01-27 DIAGNOSIS — J301 Allergic rhinitis due to pollen: Secondary | ICD-10-CM | POA: Diagnosis not present

## 2019-02-03 DIAGNOSIS — J301 Allergic rhinitis due to pollen: Secondary | ICD-10-CM | POA: Diagnosis not present

## 2019-02-10 DIAGNOSIS — J301 Allergic rhinitis due to pollen: Secondary | ICD-10-CM | POA: Diagnosis not present

## 2019-02-24 DIAGNOSIS — J301 Allergic rhinitis due to pollen: Secondary | ICD-10-CM | POA: Diagnosis not present

## 2019-03-03 DIAGNOSIS — J301 Allergic rhinitis due to pollen: Secondary | ICD-10-CM | POA: Diagnosis not present

## 2019-03-10 DIAGNOSIS — J301 Allergic rhinitis due to pollen: Secondary | ICD-10-CM | POA: Diagnosis not present

## 2019-03-17 DIAGNOSIS — J301 Allergic rhinitis due to pollen: Secondary | ICD-10-CM | POA: Diagnosis not present

## 2019-03-31 DIAGNOSIS — J301 Allergic rhinitis due to pollen: Secondary | ICD-10-CM | POA: Diagnosis not present

## 2019-04-12 DIAGNOSIS — J301 Allergic rhinitis due to pollen: Secondary | ICD-10-CM | POA: Diagnosis not present

## 2019-04-14 DIAGNOSIS — J301 Allergic rhinitis due to pollen: Secondary | ICD-10-CM | POA: Diagnosis not present

## 2019-05-05 DIAGNOSIS — J301 Allergic rhinitis due to pollen: Secondary | ICD-10-CM | POA: Diagnosis not present

## 2019-05-12 DIAGNOSIS — J301 Allergic rhinitis due to pollen: Secondary | ICD-10-CM | POA: Diagnosis not present

## 2019-05-19 DIAGNOSIS — J301 Allergic rhinitis due to pollen: Secondary | ICD-10-CM | POA: Diagnosis not present

## 2019-05-25 ENCOUNTER — Other Ambulatory Visit: Payer: Self-pay | Admitting: Nurse Practitioner

## 2019-05-30 DIAGNOSIS — J301 Allergic rhinitis due to pollen: Secondary | ICD-10-CM | POA: Diagnosis not present

## 2019-06-09 DIAGNOSIS — J301 Allergic rhinitis due to pollen: Secondary | ICD-10-CM | POA: Diagnosis not present

## 2019-06-16 DIAGNOSIS — J301 Allergic rhinitis due to pollen: Secondary | ICD-10-CM | POA: Diagnosis not present

## 2019-06-19 ENCOUNTER — Ambulatory Visit (INDEPENDENT_AMBULATORY_CARE_PROVIDER_SITE_OTHER): Payer: BC Managed Care – PPO | Admitting: Nurse Practitioner

## 2019-06-19 ENCOUNTER — Encounter: Payer: Self-pay | Admitting: Nurse Practitioner

## 2019-06-19 ENCOUNTER — Other Ambulatory Visit: Payer: Self-pay

## 2019-06-19 VITALS — BP 136/84 | HR 65 | Temp 98.3°F | Ht 73.62 in | Wt 196.2 lb

## 2019-06-19 DIAGNOSIS — I10 Essential (primary) hypertension: Secondary | ICD-10-CM

## 2019-06-19 DIAGNOSIS — F3342 Major depressive disorder, recurrent, in full remission: Secondary | ICD-10-CM | POA: Diagnosis not present

## 2019-06-19 DIAGNOSIS — E7800 Pure hypercholesterolemia, unspecified: Secondary | ICD-10-CM | POA: Diagnosis not present

## 2019-06-19 DIAGNOSIS — Z0000 Encounter for general adult medical examination without abnormal findings: Secondary | ICD-10-CM | POA: Diagnosis not present

## 2019-06-19 DIAGNOSIS — J4530 Mild persistent asthma, uncomplicated: Secondary | ICD-10-CM | POA: Diagnosis not present

## 2019-06-19 DIAGNOSIS — E559 Vitamin D deficiency, unspecified: Secondary | ICD-10-CM | POA: Insufficient documentation

## 2019-06-19 DIAGNOSIS — J301 Allergic rhinitis due to pollen: Secondary | ICD-10-CM

## 2019-06-19 MED ORDER — AMLODIPINE BESYLATE 5 MG PO TABS: 5.0000 mg | ORAL_TABLET | Freq: Two times a day (BID) | ORAL | 3 refills | Status: DC

## 2019-06-19 MED ORDER — FLUTICASONE PROPIONATE 50 MCG/ACT NA SUSP: 2.0000 | Freq: Every day | NASAL | 12 refills | Status: DC

## 2019-06-19 MED ORDER — ALBUTEROL SULFATE HFA 108 (90 BASE) MCG/ACT IN AERS: 2.0000 | INHALATION_SPRAY | Freq: Four times a day (QID) | RESPIRATORY_TRACT | 3 refills | Status: DC | PRN

## 2019-06-19 MED ORDER — LOSARTAN POTASSIUM 25 MG PO TABS: 25.0000 mg | ORAL_TABLET | Freq: Every day | ORAL | 3 refills | Status: DC

## 2019-06-19 NOTE — Assessment & Plan Note (Signed)
Check Vit D level and continue daily supplement.

## 2019-06-19 NOTE — Assessment & Plan Note (Signed)
Chronic, stable.  Continue current medication regimen and adjust as needed.  Flonase refill sent.

## 2019-06-19 NOTE — Assessment & Plan Note (Signed)
Chronic, stable.  Continue current inhaler regiment, minimally uses Albuterol (refills sent as suspect current is expired).  Plan on repeat spirometry in future.  CBC today.

## 2019-06-19 NOTE — Assessment & Plan Note (Signed)
Chronic, stable.  Continue current medication regimen and adjust as needed. Denies SI/HI.  Follow-up in 6 months for mood. 

## 2019-06-19 NOTE — Assessment & Plan Note (Addendum)
Chronic, ongoing.  He refuses statin at this time and wishes to continue to focus on lifestyle changes.  Discussed risk with current ASCVD score and recommendations for statin.  Will recheck panel and if continued elevation continue to recommend statin and educate patient.

## 2019-06-19 NOTE — Patient Instructions (Signed)
DASH Eating Plan DASH stands for "Dietary Approaches to Stop Hypertension." The DASH eating plan is a healthy eating plan that has been shown to reduce high blood pressure (hypertension). It may also reduce your risk for type 2 diabetes, heart disease, and stroke. The DASH eating plan may also help with weight loss. What are tips for following this plan?  General guidelines  Avoid eating more than 2,300 mg (milligrams) of salt (sodium) a day. If you have hypertension, you may need to reduce your sodium intake to 1,500 mg a day.  Limit alcohol intake to no more than 1 drink a day for nonpregnant women and 2 drinks a day for men. One drink equals 12 oz of beer, 5 oz of wine, or 1 oz of hard liquor.  Work with your health care provider to maintain a healthy body weight or to lose weight. Ask what an ideal weight is for you.  Get at least 30 minutes of exercise that causes your heart to beat faster (aerobic exercise) most days of the week. Activities may include walking, swimming, or biking.  Work with your health care provider or diet and nutrition specialist (dietitian) to adjust your eating plan to your individual calorie needs. Reading food labels   Check food labels for the amount of sodium per serving. Choose foods with less than 5 percent of the Daily Value of sodium. Generally, foods with less than 300 mg of sodium per serving fit into this eating plan.  To find whole grains, look for the word "whole" as the first word in the ingredient list. Shopping  Buy products labeled as "low-sodium" or "no salt added."  Buy fresh foods. Avoid canned foods and premade or frozen meals. Cooking  Avoid adding salt when cooking. Use salt-free seasonings or herbs instead of table salt or sea salt. Check with your health care provider or pharmacist before using salt substitutes.  Do not fry foods. Cook foods using healthy methods such as baking, boiling, grilling, and broiling instead.  Cook with  heart-healthy oils, such as olive, canola, soybean, or sunflower oil. Meal planning  Eat a balanced diet that includes: ? 5 or more servings of fruits and vegetables each day. At each meal, try to fill half of your plate with fruits and vegetables. ? Up to 6-8 servings of whole grains each day. ? Less than 6 oz of lean meat, poultry, or fish each day. A 3-oz serving of meat is about the same size as a deck of cards. One egg equals 1 oz. ? 2 servings of low-fat dairy each day. ? A serving of nuts, seeds, or beans 5 times each week. ? Heart-healthy fats. Healthy fats called Omega-3 fatty acids are found in foods such as flaxseeds and coldwater fish, like sardines, salmon, and mackerel.  Limit how much you eat of the following: ? Canned or prepackaged foods. ? Food that is high in trans fat, such as fried foods. ? Food that is high in saturated fat, such as fatty meat. ? Sweets, desserts, sugary drinks, and other foods with added sugar. ? Full-fat dairy products.  Do not salt foods before eating.  Try to eat at least 2 vegetarian meals each week.  Eat more home-cooked food and less restaurant, buffet, and fast food.  When eating at a restaurant, ask that your food be prepared with less salt or no salt, if possible. What foods are recommended? The items listed may not be a complete list. Talk with your dietitian about   what dietary choices are best for you. Grains Whole-grain or whole-wheat bread. Whole-grain or whole-wheat pasta. Brown rice. Oatmeal. Quinoa. Bulgur. Whole-grain and low-sodium cereals. Pita bread. Low-fat, low-sodium crackers. Whole-wheat flour tortillas. Vegetables Fresh or frozen vegetables (raw, steamed, roasted, or grilled). Low-sodium or reduced-sodium tomato and vegetable juice. Low-sodium or reduced-sodium tomato sauce and tomato paste. Low-sodium or reduced-sodium canned vegetables. Fruits All fresh, dried, or frozen fruit. Canned fruit in natural juice (without  added sugar). Meat and other protein foods Skinless chicken or turkey. Ground chicken or turkey. Pork with fat trimmed off. Fish and seafood. Egg whites. Dried beans, peas, or lentils. Unsalted nuts, nut butters, and seeds. Unsalted canned beans. Lean cuts of beef with fat trimmed off. Low-sodium, lean deli meat. Dairy Low-fat (1%) or fat-free (skim) milk. Fat-free, low-fat, or reduced-fat cheeses. Nonfat, low-sodium ricotta or cottage cheese. Low-fat or nonfat yogurt. Low-fat, low-sodium cheese. Fats and oils Soft margarine without trans fats. Vegetable oil. Low-fat, reduced-fat, or light mayonnaise and salad dressings (reduced-sodium). Canola, safflower, olive, soybean, and sunflower oils. Avocado. Seasoning and other foods Herbs. Spices. Seasoning mixes without salt. Unsalted popcorn and pretzels. Fat-free sweets. What foods are not recommended? The items listed may not be a complete list. Talk with your dietitian about what dietary choices are best for you. Grains Baked goods made with fat, such as croissants, muffins, or some breads. Dry pasta or rice meal packs. Vegetables Creamed or fried vegetables. Vegetables in a cheese sauce. Regular canned vegetables (not low-sodium or reduced-sodium). Regular canned tomato sauce and paste (not low-sodium or reduced-sodium). Regular tomato and vegetable juice (not low-sodium or reduced-sodium). Pickles. Olives. Fruits Canned fruit in a light or heavy syrup. Fried fruit. Fruit in cream or butter sauce. Meat and other protein foods Fatty cuts of meat. Ribs. Fried meat. Bacon. Sausage. Bologna and other processed lunch meats. Salami. Fatback. Hotdogs. Bratwurst. Salted nuts and seeds. Canned beans with added salt. Canned or smoked fish. Whole eggs or egg yolks. Chicken or turkey with skin. Dairy Whole or 2% milk, cream, and half-and-half. Whole or full-fat cream cheese. Whole-fat or sweetened yogurt. Full-fat cheese. Nondairy creamers. Whipped toppings.  Processed cheese and cheese spreads. Fats and oils Butter. Stick margarine. Lard. Shortening. Ghee. Bacon fat. Tropical oils, such as coconut, palm kernel, or palm oil. Seasoning and other foods Salted popcorn and pretzels. Onion salt, garlic salt, seasoned salt, table salt, and sea salt. Worcestershire sauce. Tartar sauce. Barbecue sauce. Teriyaki sauce. Soy sauce, including reduced-sodium. Steak sauce. Canned and packaged gravies. Fish sauce. Oyster sauce. Cocktail sauce. Horseradish that you find on the shelf. Ketchup. Mustard. Meat flavorings and tenderizers. Bouillon cubes. Hot sauce and Tabasco sauce. Premade or packaged marinades. Premade or packaged taco seasonings. Relishes. Regular salad dressings. Where to find more information:  National Heart, Lung, and Blood Institute: www.nhlbi.nih.gov  American Heart Association: www.heart.org Summary  The DASH eating plan is a healthy eating plan that has been shown to reduce high blood pressure (hypertension). It may also reduce your risk for type 2 diabetes, heart disease, and stroke.  With the DASH eating plan, you should limit salt (sodium) intake to 2,300 mg a day. If you have hypertension, you may need to reduce your sodium intake to 1,500 mg a day.  When on the DASH eating plan, aim to eat more fresh fruits and vegetables, whole grains, lean proteins, low-fat dairy, and heart-healthy fats.  Work with your health care provider or diet and nutrition specialist (dietitian) to adjust your eating plan to your   individual calorie needs. This information is not intended to replace advice given to you by your health care provider. Make sure you discuss any questions you have with your health care provider. Document Revised: 04/30/2017 Document Reviewed: 05/11/2016 Elsevier Patient Education  2020 [employer]  

## 2019-06-19 NOTE — Assessment & Plan Note (Signed)
Chronic, ongoing.  Initial BP elevated and home BP elevated, repeat manual today improved.  Will trial low dose Losartan 25 MG, script sent.  Continue Amlodipine 5 MG BID (patient preference).  Continue to monitor BP at home and document.  Labs today.  Return in 4 weeks.

## 2019-06-19 NOTE — Progress Notes (Signed)
BP 136/84 (BP Location: Left Arm, Patient Position: Sitting)   Pulse 65   Temp 98.3 F (36.8 C) (Oral)   Ht 6' 1.62" (1.87 m)   Wt 196 lb 3.2 oz (89 kg)   SpO2 94%   BMI 25.45 kg/m    Subjective:    Patient ID: Jonathan Roberson, male    DOB: 1955/12/30, 64 y.o.   MRN: [id number]  HPI: Jonathan Roberson is a 64 y.o. male presenting on 06/19/2019 for comprehensive medical examination. Current medical complaints include:none  He currently lives with: wife Interim Problems from his last visit: no   HYPERTENSION / HYPERLIPIDEMIA Taking Amlodipine 10 MG daily (takes Amlodipine 5 MG at night and 5 MG in morning).  No medications for cholesterol.  He tried Lisinopril, but it made him feel squirely.  Has not taken it in months.   Satisfied with current treatment? yes Duration of hypertension: chronic BP monitoring frequency: rarely BP range: 140/80-90 BP medication side effects: no Duration of hyperlipidemia: chronic Cholesterol supplements: fish oil and specific bran muffins that help with cholesterol Aspirin: yes Recent stressors: no Recurrent headaches: no Visual changes: no Palpitations: no Dyspnea: no Chest pain: no Lower extremity edema: no Dizzy/lightheaded: no  The 10-year ASCVD risk score [name], et al., 2013) is: 11.7%   Values used to calculate the score:     Age: 41 years     Sex: Male     Is Non-Hispanic African American: No     Diabetic: No     Tobacco smoker: No     Systolic Blood Pressure: XX123456 mmHg     Is BP treated: Yes     HDL Cholesterol: 80 mg/dL     Total Cholesterol: 229 mg/dL  ASTHMA Continues on Flovent and Albuterol + takes daily Claritin and Flonase for allergies.  Does take a daily Vit D supplement due to history of low levels. Asthma status: controlled Satisfied with current treatment?: yes Albuterol/rescue inhaler frequency: once in the past year Dyspnea frequency: none Wheezing frequency: none Cough frequency: none Nocturnal symptom  frequency: none Limitation of activity: no Current upper respiratory symptoms: no Triggers: allergies Home peak flows: none Last Spirometry: unknown Failed/intolerant to following asthma meds:  Asthma meds in past:  none Aerochamber/spacer use: no Visits to ER or Urgent Care in past year: no Pneumovax: Up to Date Influenza: Up to Date  DEPRESSION Continues on Paxil 20 MG.  Depression is better than it has been in 20 years, feels like some of the aging process has helped this. Mood status: stable Satisfied with current treatment?: yes Symptom severity: mild  Duration of current treatment : chronic Side effects: no Medication compliance: good compliance Psychotherapy/counseling: none Depressed mood: yes Anxious mood: no Anhedonia: no Significant weight loss or gain: no Insomnia:  none Fatigue: no Feelings of worthlessness or guilt: no Impaired concentration/indecisiveness: occasional Suicidal ideations: no Hopelessness: no Crying spells: no Depression screen [facility] 2/9 06/19/2019 12/09/2018 08/10/2018 12/21/2017 06/29/2017  Decreased Interest 1 0 1 0 0  Down, Depressed, Hopeless 0 0 0 0 0  PHQ - 2 Score 1 0 1 0 0  Altered sleeping 1 0 0 0 0  Tired, decreased energy 1 1 1  0 1  Change in appetite 0 0 0 0 0  Feeling bad or failure about yourself  0 0 0 0 0  Trouble concentrating 1 0 0 0 0  Moving slowly or fidgety/restless 0 0 0 0 1  Suicidal thoughts 0 0  0 0 0  PHQ-9 Score 4 1 2  0 2  Difficult doing work/chores Not difficult at all Not difficult at all Not difficult at all - -    Functional Status Survey: Is the patient deaf or have difficulty hearing?: No Does the patient have difficulty seeing, even when wearing glasses/contacts?: No Does the patient have difficulty concentrating, remembering, or making decisions?: No Does the patient have difficulty walking or climbing stairs?: No Does the patient have difficulty dressing or bathing?: No Does the patient have difficulty  doing errands alone such as visiting a doctor's office or shopping?: No  FALL RISK: Fall Risk  06/29/2017  Falls in the past year? No    Advanced Directives <no information>  Past Medical History:  Past Medical History:  Diagnosis Date  . Allergy   . Allergy to dust   . Arthritis    hands, hips, knees  . Asthma   . Depression   . Hyperlipidemia   . Hypertension     Surgical History:  Past Surgical History:  Procedure Laterality Date  . BIOPSY N/A 08/14/2016   Procedure: BIOPSY;  Surgeon: Lucilla Lame, MD;  Location: [place];  Service: Endoscopy;  Laterality: N/A;  . COLONOSCOPY WITH PROPOFOL N/A 08/14/2016   Procedure: COLONOSCOPY WITH PROPOFOL;  Surgeon: Lucilla Lame, MD;  Location: [place];  Service: Endoscopy;  Laterality: N/A;  . IMAGE GUIDED SINUS SURGERY Bilateral 09/24/2015   Procedure: IMAGE GUIDED SINUS SURGERY Bilateral nasal polypectomy,maxillary antrostomy with tissue removal,bilateral ethmoidectomy,frontal sinusotomy;  Surgeon: Clyde Canterbury, MD;  Location: [place];  Service: ENT;  Laterality: Bilateral;  GAVE DISK TO CE CE 4-12 KP  . NASAL SINUS SURGERY  2005   Dr. Nadeen Landau, [facility]    Medications:  Current Outpatient Medications on File Prior to Visit  Medication Sig  . albuterol (PROVENTIL) (2.5 MG/3ML) 0.083% nebulizer solution USE 1 VIAL (3 ML) (2.5 MG TOTAL) VIA NEBULIZER EVERY 6 HOURS AS NEEDED FOR WHEEZING OR SHORTNESS OF BREATH  . Ascorbic Acid (VITAMIN C PO) Take by mouth.  . Aspirin Buf,CaCarb-MgCarb-MgO, 81 MG TABS Take by mouth.  Marland Kitchen b complex vitamins tablet Take 1 tablet by mouth daily.  Marland Kitchen FLOVENT HFA 110 MCG/ACT inhaler   . Glucosamine-Chondroitin (GLUCOSAMINE CHONDR COMPLEX PO) Take by mouth.  Marland Kitchen MAGNESIUM-POTASSIUM PO Take by mouth daily.  . Multiple Vitamins-Minerals (ZINC PO) Take by mouth.  Marland Kitchen PARoxetine (PAXIL) 20 MG tablet TAKE 1 TABLET DAILY  . VITAMIN D, ERGOCALCIFEROL, PO Take 1 tablet by mouth daily.   Marland Kitchen loratadine (CLARITIN) 10 MG tablet Take 10 mg by mouth daily as needed for allergies.   No current facility-administered medications on file prior to visit.    Allergies:  Allergies  Allergen Reactions  . Shrimp [Shellfish Allergy] Shortness Of Breath    When eaten in large amounts  . Monosodium Glutamate     headaches  . Amoxicillin Rash  . Erythromycin Rash  . Penicillins Rash    Social History:  Social History   Socioeconomic History  . Marital status: Married    Spouse name: Not on file  . Number of children: Not on file  . Years of education: Not on file  . Highest education level: Not on file  Occupational History  . Not on file  Tobacco Use  . Smoking status: Never Smoker  . Smokeless tobacco: Never Used  Substance and Sexual Activity  . Alcohol use: Not Currently    Alcohol/week: 0.0 standard drinks  Comment: on occasion- about once a year  . Drug use: No  . Sexual activity: Yes  Other Topics Concern  . Not on file  Social History Narrative  . Not on file   Social Determinants of Health   Financial Resource Strain:   . Difficulty of Paying Living Expenses: Not on file  Food Insecurity:   . Worried About Charity fundraiser in the Last Year: Not on file  . Ran Out of Food in the Last Year: Not on file  Transportation Needs:   . Lack of Transportation (Medical): Not on file  . Lack of Transportation (Non-Medical): Not on file  Physical Activity:   . Days of Exercise per Week: Not on file  . Minutes of Exercise per Session: Not on file  Stress:   . Feeling of Stress : Not on file  Social Connections:   . Frequency of Communication with Friends and Family: Not on file  . Frequency of Social Gatherings with Friends and Family: Not on file  . Attends Religious Services: Not on file  . Active Member of Clubs or Organizations: Not on file  . Attends Archivist Meetings: Not on file  . Marital Status: Not on file  Intimate Partner  Violence:   . Fear of Current or Ex-Partner: Not on file  . Emotionally Abused: Not on file  . Physically Abused: Not on file  . Sexually Abused: Not on file   Social History   Tobacco Use  Smoking Status Never Smoker  Smokeless Tobacco Never Used   Social History   Substance and Sexual Activity  Alcohol Use Not Currently  . Alcohol/week: 0.0 standard drinks   Comment: on occasion- about once a year    Family History:  Family History  Problem Relation Age of Onset  . Heart disease Mother   . Depression Mother   . Arthritis Sister   . Emphysema Father   . Depression Daughter     Past medical history, surgical history, medications, allergies, family history and social history reviewed with patient today and changes made to appropriate areas of the chart.   Review of Systems - negative All other ROS negative except what is listed above and in the HPI.      Objective:    BP 136/84 (BP Location: Left Arm, Patient Position: Sitting)   Pulse 65   Temp 98.3 F (36.8 C) (Oral)   Ht 6' 1.62" (1.87 m)   Wt 196 lb 3.2 oz (89 kg)   SpO2 94%   BMI 25.45 kg/m   Wt Readings from Last 3 Encounters:  06/19/19 196 lb 3.2 oz (89 kg)  12/09/18 185 lb (83.9 kg)  09/07/18 185 lb (83.9 kg)    Physical Exam Vitals and nursing note reviewed.  Constitutional:      General: He is awake. He is not in acute distress.    Appearance: He is well-developed. He is not ill-appearing.  HENT:     Head: Normocephalic and atraumatic.     Right Ear: Hearing, tympanic membrane, ear canal and external ear normal. No drainage.     Left Ear: Hearing, tympanic membrane, ear canal and external ear normal. No drainage.     Nose: Nose normal.     Mouth/Throat:     Pharynx: Uvula midline.  Eyes:     General: Lids are normal.        Right eye: No discharge.        Left eye: No  discharge.     Extraocular Movements: Extraocular movements intact.     Conjunctiva/sclera: Conjunctivae normal.      Pupils: Pupils are equal, round, and reactive to light.     Visual Fields: Right eye visual fields normal and left eye visual fields normal.  Neck:     Thyroid: No thyromegaly.     Vascular: No carotid bruit or JVD.     Trachea: Trachea normal.  Cardiovascular:     Rate and Rhythm: Normal rate and regular rhythm.     Heart sounds: Normal heart sounds, S1 normal and S2 normal. No murmur. No gallop.   Pulmonary:     Effort: Pulmonary effort is normal. No accessory muscle usage or respiratory distress.     Breath sounds: Normal breath sounds.  Abdominal:     General: Bowel sounds are normal.     Palpations: Abdomen is soft. There is no hepatomegaly or splenomegaly.     Tenderness: There is no abdominal tenderness.  Musculoskeletal:        General: Normal range of motion.     Cervical back: Normal range of motion and neck supple.     Right lower leg: No edema.     Left lower leg: No edema.  Lymphadenopathy:     Head:     Right side of head: No submental, submandibular, tonsillar, preauricular or posterior auricular adenopathy.     Left side of head: No submental, submandibular, tonsillar, preauricular or posterior auricular adenopathy.     Cervical: No cervical adenopathy.  Skin:    General: Skin is warm and dry.     Capillary Refill: Capillary refill takes less than 2 seconds.     Findings: No rash.  Neurological:     Mental Status: He is alert and oriented to person, place, and time.     Cranial Nerves: Cranial nerves are intact.     Gait: Gait is intact.     Deep Tendon Reflexes: Reflexes are normal and symmetric.     Reflex Scores:      Brachioradialis reflexes are 2+ on the right side and 2+ on the left side.      Patellar reflexes are 2+ on the right side and 2+ on the left side. Psychiatric:        Attention and Perception: Attention normal.        Mood and Affect: Mood normal.        Speech: Speech normal.        Behavior: Behavior normal. Behavior is cooperative.         Thought Content: Thought content normal.        Cognition and Memory: Cognition normal.        Judgment: Judgment normal.     Results for orders placed or performed in visit on 08/10/18  Comprehensive metabolic panel  Result Value Ref Range   Glucose 70 65 - 99 mg/dL   BUN 10 8 - 27 mg/dL   Creatinine, Ser 0.70 (L) 0.76 - 1.27 mg/dL   GFR calc non Af Amer 101 >59 mL/min/1.73   GFR calc Af Amer 117 >59 mL/min/1.73   BUN/Creatinine Ratio 14 10 - 24   Sodium 134 134 - 144 mmol/L   Potassium 3.8 3.5 - 5.2 mmol/L   Chloride 92 (L) 96 - 106 mmol/L   CO2 26 20 - 29 mmol/L   Calcium 9.0 8.6 - 10.2 mg/dL   Total Protein 7.4 6.0 - 8.5 g/dL   Albumin 4.5 3.8 -  4.8 g/dL   Globulin, Total 2.9 1.5 - 4.5 g/dL   Albumin/Globulin Ratio 1.6 1.2 - 2.2   Bilirubin Total 0.9 0.0 - 1.2 mg/dL   Alkaline Phosphatase 61 39 - 117 IU/L   AST 23 0 - 40 IU/L   ALT 17 0 - 44 IU/L  CBC with Differential/Platelet  Result Value Ref Range   WBC 3.9 3.4 - 10.8 x10E3/uL   RBC 4.37 4.14 - 5.80 x10E6/uL   Hemoglobin 13.9 13.0 - 17.7 g/dL   Hematocrit 40.1 37.5 - 51.0 %   MCV 92 79 - 97 fL   MCH 31.8 26.6 - 33.0 pg   MCHC 34.7 31.5 - 35.7 g/dL   RDW 11.9 11.6 - 15.4 %   Platelets 199 150 - 450 x10E3/uL   Neutrophils 51 Not Estab. %   Lymphs 28 Not Estab. %   Monocytes 14 Not Estab. %   Eos 6 Not Estab. %   Basos 1 Not Estab. %   Neutrophils Absolute 2.0 1.4 - 7.0 x10E3/uL   Lymphocytes Absolute 1.1 0.7 - 3.1 x10E3/uL   Monocytes Absolute 0.5 0.1 - 0.9 x10E3/uL   EOS (ABSOLUTE) 0.2 0.0 - 0.4 x10E3/uL   Basophils Absolute 0.0 0.0 - 0.2 x10E3/uL   Immature Granulocytes 0 Not Estab. %   Immature Grans (Abs) 0.0 0.0 - 0.1 x10E3/uL  Lipid Panel w/o Chol/HDL Ratio  Result Value Ref Range   Cholesterol, Total 229 (H) 100 - 199 mg/dL   Triglycerides 61 0 - 149 mg/dL   HDL 80 >39 mg/dL   VLDL Cholesterol Cal 12 5 - 40 mg/dL   LDL Calculated 137 (H) 0 - 99 mg/dL      Assessment & Plan:   Problem List  Items Addressed This Visit      Cardiovascular and Mediastinum   Hypertension    Chronic, ongoing.  Initial BP elevated and home BP elevated, repeat manual today improved.  Will trial low dose Losartan 25 MG, script sent.  Continue Amlodipine 5 MG BID (patient preference).  Continue to monitor BP at home and document.  Labs today.  Return in 4 weeks.      Relevant Medications   losartan (COZAAR) 25 MG tablet   amLODipine (NORVASC) 5 MG tablet   Other Relevant Orders   Comprehensive metabolic panel   TSH     Respiratory   Allergic rhinitis    Chronic, stable.  Continue current medication regimen and adjust as needed.  Flonase refill sent.      Asthma    Chronic, stable.  Continue current inhaler regiment, minimally uses Albuterol (refills sent as suspect current is expired).  Plan on repeat spirometry in future.  CBC today.       Relevant Medications   albuterol (VENTOLIN HFA) 108 (90 Base) MCG/ACT inhaler   Other Relevant Orders   CBC with Differential/Platelet     Other   Hyperlipidemia    Chronic, ongoing.  He refuses statin at this time and wishes to continue to focus on lifestyle changes.  Discussed risk with current ASCVD score and recommendations for statin.  Will recheck panel and if continued elevation continue to recommend statin and educate patient.      Relevant Medications   losartan (COZAAR) 25 MG tablet   amLODipine (NORVASC) 5 MG tablet   Other Relevant Orders   Lipid Panel w/o Chol/HDL Ratio   Major depression in full remission (HCC)    Chronic, stable.  Continue current medication regimen and adjust  as needed. Denies SI/HI.  Follow-up in 6 months for mood.      Relevant Orders   TSH   Vitamin D deficiency    Check Vit D level and continue daily supplement.      Relevant Orders   VITAMIN D 25 Hydroxy (Vit-D Deficiency, Fractures)    Other Visit Diagnoses    Annual physical exam    -  Primary   Annual labs to include PSA, TSH, Lipid, CBC, CMP.    Relevant Orders   PSA       Discussed aspirin prophylaxis for myocardial infarction prevention and decision was made to continue ASA  LABORATORY TESTING:  Health maintenance labs ordered today as discussed above.   The natural history of prostate cancer and ongoing controversy regarding screening and potential treatment outcomes of prostate cancer has been discussed with the patient. The meaning of a false positive PSA and a false negative PSA has been discussed. He indicates understanding of the limitations of this screening test and wishes to proceed with screening PSA testing.   IMMUNIZATIONS:   - Tdap: Tetanus vaccination status reviewed: last tetanus booster within 10 years. - Influenza: Up to date - Pneumovax: Up to date - Prevnar: Not applicable - Zostavax vaccine: Refused  SCREENING: - Colonoscopy: Up to date  Discussed with patient purpose of the colonoscopy is to detect colon cancer at curable precancerous or early stages   - AAA Screening: Not applicable  -Hearing Test: Not applicable  -Spirometry: Not applicable   PATIENT COUNSELING:    Sexuality: Discussed sexually transmitted diseases, partner selection, use of condoms, avoidance of unintended pregnancy  and contraceptive alternatives.   Advised to avoid cigarette smoking.  I discussed with the patient that most people either abstain from alcohol or drink within safe limits (<=14/week and <=4 drinks/occasion for males, <=7/weeks and <= 3 drinks/occasion for females) and that the risk for alcohol disorders and other health effects rises proportionally with the number of drinks per week and how often a drinker exceeds daily limits.  Discussed cessation/primary prevention of drug use and availability of treatment for abuse.   Diet: Encouraged to adjust caloric intake to maintain  or achieve ideal body weight, to reduce intake of dietary saturated fat and total fat, to limit sodium intake by avoiding high sodium foods  and not adding table salt, and to maintain adequate dietary potassium and calcium preferably from fresh fruits, vegetables, and low-fat dairy products.    stressed the importance of regular exercise  Injury prevention: Discussed safety belts, safety helmets, smoke detector, smoking near bedding or upholstery.   Dental health: Discussed importance of regular tooth brushing, flossing, and dental visits.   Follow up plan: NEXT PREVENTATIVE PHYSICAL DUE IN 1 YEAR. Return in about 4 weeks (around 07/17/2019) for HTN.

## 2019-06-20 ENCOUNTER — Other Ambulatory Visit: Payer: Self-pay | Admitting: Nurse Practitioner

## 2019-06-20 DIAGNOSIS — E871 Hypo-osmolality and hyponatremia: Secondary | ICD-10-CM

## 2019-06-20 LAB — CBC WITH DIFFERENTIAL/PLATELET
Basophils Absolute: 0.1 10*3/uL (ref 0.0–0.2)
Basos: 1 %
EOS (ABSOLUTE): 0.3 10*3/uL (ref 0.0–0.4)
Eos: 7 %
Hematocrit: 40.7 % (ref 37.5–51.0)
Hemoglobin: 13.7 g/dL (ref 13.0–17.7)
Immature Grans (Abs): 0 10*3/uL (ref 0.0–0.1)
Immature Granulocytes: 0 %
Lymphocytes Absolute: 1.2 10*3/uL (ref 0.7–3.1)
Lymphs: 28 %
MCH: 31.5 pg (ref 26.6–33.0)
MCHC: 33.7 g/dL (ref 31.5–35.7)
MCV: 94 fL (ref 79–97)
Monocytes Absolute: 0.5 10*3/uL (ref 0.1–0.9)
Monocytes: 12 %
Neutrophils Absolute: 2.4 10*3/uL (ref 1.4–7.0)
Neutrophils: 52 %
Platelets: 179 10*3/uL (ref 150–450)
RBC: 4.35 x10E6/uL (ref 4.14–5.80)
RDW: 12 % (ref 11.6–15.4)
WBC: 4.5 10*3/uL (ref 3.4–10.8)

## 2019-06-20 LAB — COMPREHENSIVE METABOLIC PANEL
ALT: 16 IU/L (ref 0–44)
AST: 22 IU/L (ref 0–40)
Albumin/Globulin Ratio: 1.5 (ref 1.2–2.2)
Albumin: 4.5 g/dL (ref 3.8–4.8)
Alkaline Phosphatase: 61 IU/L (ref 39–117)
BUN/Creatinine Ratio: 16 (ref 10–24)
BUN: 10 mg/dL (ref 8–27)
Bilirubin Total: 0.8 mg/dL (ref 0.0–1.2)
CO2: 26 mmol/L (ref 20–29)
Calcium: 8.8 mg/dL (ref 8.6–10.2)
Chloride: 91 mmol/L — ABNORMAL LOW (ref 96–106)
Creatinine, Ser: 0.64 mg/dL — ABNORMAL LOW (ref 0.76–1.27)
GFR calc Af Amer: 120 mL/min/{1.73_m2} (ref >59)
GFR calc non Af Amer: 104 mL/min/{1.73_m2} (ref >59)
Globulin, Total: 3.1 g/dL (ref 1.5–4.5)
Glucose: 84 mg/dL (ref 65–99)
Potassium: 3.6 mmol/L (ref 3.5–5.2)
Sodium: 131 mmol/L — ABNORMAL LOW (ref 134–144)
Total Protein: 7.6 g/dL (ref 6.0–8.5)

## 2019-06-20 LAB — TSH: TSH: 0.675 u[IU]/mL (ref 0.450–4.500)

## 2019-06-20 LAB — LIPID PANEL W/O CHOL/HDL RATIO
Cholesterol, Total: 229 mg/dL — ABNORMAL HIGH (ref 100–199)
HDL: 83 mg/dL (ref >39)
LDL Chol Calc (NIH): 129 mg/dL — ABNORMAL HIGH (ref 0–99)
Triglycerides: 100 mg/dL (ref 0–149)
VLDL Cholesterol Cal: 17 mg/dL (ref 5–40)

## 2019-06-20 LAB — PSA: Prostate Specific Ag, Serum: 3.1 ng/mL (ref 0.0–4.0)

## 2019-06-20 LAB — VITAMIN D 25 HYDROXY (VIT D DEFICIENCY, FRACTURES): Vit D, 25-Hydroxy: 26.2 ng/mL — ABNORMAL LOW (ref 30.0–100.0)

## 2019-06-20 NOTE — Progress Notes (Signed)
Contacted via MyChart The 10-year ASCVD risk score [name], et al., 2013) is: 11.4%   Values used to calculate the score:     Age: 64 years     Sex: Male     Is Non-Hispanic African American: No     Diabetic: No     Tobacco smoker: No     Systolic Blood Pressure: XX123456 mmHg     Is BP treated: Yes     HDL Cholesterol: 83 mg/dL     Total Cholesterol: 229 mg/dL

## 2019-06-27 DIAGNOSIS — J301 Allergic rhinitis due to pollen: Secondary | ICD-10-CM | POA: Diagnosis not present

## 2019-06-29 ENCOUNTER — Encounter: Payer: Self-pay | Admitting: Nurse Practitioner

## 2019-06-30 DIAGNOSIS — J301 Allergic rhinitis due to pollen: Secondary | ICD-10-CM | POA: Diagnosis not present

## 2019-07-07 DIAGNOSIS — J301 Allergic rhinitis due to pollen: Secondary | ICD-10-CM | POA: Diagnosis not present

## 2019-07-14 DIAGNOSIS — J301 Allergic rhinitis due to pollen: Secondary | ICD-10-CM | POA: Diagnosis not present

## 2019-07-17 ENCOUNTER — Encounter: Payer: Self-pay | Admitting: Nurse Practitioner

## 2019-07-17 ENCOUNTER — Telehealth (INDEPENDENT_AMBULATORY_CARE_PROVIDER_SITE_OTHER): Payer: BC Managed Care – PPO | Admitting: Nurse Practitioner

## 2019-07-17 DIAGNOSIS — I10 Essential (primary) hypertension: Secondary | ICD-10-CM

## 2019-07-17 NOTE — Assessment & Plan Note (Signed)
Chronic, ongoing. Some improvement in BP with addition of Losartan, has not been consistently checking BP at home.  He wishes to continue Losartan at 25 MG daily at this time and focus on checking BP and diet.  Continue Amlodipine 5 MG BID (patient preference).  Continue to monitor BP at home and document.  He will MyChart provider in 4 weeks with BP readings.  Plan on follow-up in 4 months (June) or sooner if needed.

## 2019-07-17 NOTE — Progress Notes (Signed)
There were no vitals taken for this visit.   Subjective:    Patient ID: Jonathan Roberson, male    DOB: Jan 25, 1956, 64 y.o.   MRN: [id number]  HPI: Jonathan Roberson is a 63 y.o. male  Chief Complaint  Patient presents with  . Hypertension    . This visit was completed via telephone due to the restrictions of the COVID-19 pandemic. All issues as above were discussed and addressed but no physical exam was performed. If it was felt that the patient should be evaluated in the office, they were directed there. The patient verbally consented to this visit. Patient was unable to complete an audio/visual visit due to Lack of equipment. Due to the catastrophic nature of the COVID-19 pandemic, this visit was done through audio contact only. Attempt MyChart video, but patient forgot password. . Location of the patient: home . Location of the provider: work . Those involved with this call:  . Provider: Marnee Guarneri, DNP . CMA: Yvonna Alanis, CMA . Front Desk/Registration: Don Perking  . Time spent on call: 15 minutes on the phone discussing health concerns. 10 minutes total spent in review of patient's record and preparation of their chart.  . I verified patient identity using two factors (patient name and date of birth). Patient consents verbally to being seen via telemedicine visit today.    HYPERTENSION Continues on Amlodipine 5 MG BID and added on Losartan 25 MG daily at recent visit.   Hypertension status: stable  Satisfied with current treatment? yes Duration of hypertension: chronic BP monitoring frequency:  rarely BP range: 138/87 the other day, not consistently checking BP medication side effects:  no Medication compliance: good compliance Aspirin: no Recurrent headaches: no Visual changes: no Palpitations: no Dyspnea: no Chest pain: no Lower extremity edema: no Dizzy/lightheaded: no  Relevant past medical, surgical, family and social history reviewed and updated as  indicated. Interim medical history since our last visit reviewed. Allergies and medications reviewed and updated.  Review of Systems  Constitutional: Negative for activity change, diaphoresis, fatigue and fever.  Respiratory: Negative for cough, chest tightness, shortness of breath and wheezing.   Cardiovascular: Negative for chest pain, palpitations and leg swelling.  Gastrointestinal: Negative.   Neurological: Negative.   Psychiatric/Behavioral: Negative.     Per HPI unless specifically indicated above     Objective:    There were no vitals taken for this visit.  Wt Readings from Last 3 Encounters:  06/19/19 196 lb 3.2 oz (89 kg)  12/09/18 185 lb (83.9 kg)  09/07/18 185 lb (83.9 kg)    Physical Exam   Unable to perform due to telephone visit only.    Results for orders placed or performed in visit on 06/19/19  CBC with Differential/Platelet  Result Value Ref Range   WBC 4.5 3.4 - 10.8 x10E3/uL   RBC 4.35 4.14 - 5.80 x10E6/uL   Hemoglobin 13.7 13.0 - 17.7 g/dL   Hematocrit 40.7 37.5 - 51.0 %   MCV 94 79 - 97 fL   MCH 31.5 26.6 - 33.0 pg   MCHC 33.7 31.5 - 35.7 g/dL   RDW 12.0 11.6 - 15.4 %   Platelets 179 150 - 450 x10E3/uL   Neutrophils 52 Not Estab. %   Lymphs 28 Not Estab. %   Monocytes 12 Not Estab. %   Eos 7 Not Estab. %   Basos 1 Not Estab. %   Neutrophils Absolute 2.4 1.4 - 7.0 x10E3/uL   Lymphocytes Absolute 1.2  0.7 - 3.1 x10E3/uL   Monocytes Absolute 0.5 0.1 - 0.9 x10E3/uL   EOS (ABSOLUTE) 0.3 0.0 - 0.4 x10E3/uL   Basophils Absolute 0.1 0.0 - 0.2 x10E3/uL   Immature Granulocytes 0 Not Estab. %   Immature Grans (Abs) 0.0 0.0 - 0.1 x10E3/uL  Comprehensive metabolic panel  Result Value Ref Range   Glucose 84 65 - 99 mg/dL   BUN 10 8 - 27 mg/dL   Creatinine, Ser 0.64 (L) 0.76 - 1.27 mg/dL   GFR calc non Af Amer 104 >59 mL/min/1.73   GFR calc Af Amer 120 >59 mL/min/1.73   BUN/Creatinine Ratio 16 10 - 24   Sodium 131 (L) 134 - 144 mmol/L   Potassium 3.6  3.5 - 5.2 mmol/L   Chloride 91 (L) 96 - 106 mmol/L   CO2 26 20 - 29 mmol/L   Calcium 8.8 8.6 - 10.2 mg/dL   Total Protein 7.6 6.0 - 8.5 g/dL   Albumin 4.5 3.8 - 4.8 g/dL   Globulin, Total 3.1 1.5 - 4.5 g/dL   Albumin/Globulin Ratio 1.5 1.2 - 2.2   Bilirubin Total 0.8 0.0 - 1.2 mg/dL   Alkaline Phosphatase 61 39 - 117 IU/L   AST 22 0 - 40 IU/L   ALT 16 0 - 44 IU/L  TSH  Result Value Ref Range   TSH 0.675 0.450 - 4.500 uIU/mL  PSA  Result Value Ref Range   Prostate Specific Ag, Serum 3.1 0.0 - 4.0 ng/mL  Lipid Panel w/o Chol/HDL Ratio  Result Value Ref Range   Cholesterol, Total 229 (H) 100 - 199 mg/dL   Triglycerides 100 0 - 149 mg/dL   HDL 83 >39 mg/dL   VLDL Cholesterol Cal 17 5 - 40 mg/dL   LDL Chol Calc (NIH) 129 (H) 0 - 99 mg/dL  VITAMIN D 25 Hydroxy (Vit-D Deficiency, Fractures)  Result Value Ref Range   Vit D, 25-Hydroxy 26.2 (L) 30.0 - 100.0 ng/mL      Assessment & Plan:   Problem List Items Addressed This Visit      Cardiovascular and Mediastinum   Hypertension    Chronic, ongoing. Some improvement in BP with addition of Losartan, has not been consistently checking BP at home.  He wishes to continue Losartan at 25 MG daily at this time and focus on checking BP and diet.  Continue Amlodipine 5 MG BID (patient preference).  Continue to monitor BP at home and document.  He will MyChart provider in 4 weeks with BP readings.  Plan on follow-up in 4 months (June) or sooner if needed.         I discussed the assessment and treatment plan with the patient. The patient was provided an opportunity to ask questions and all were answered. The patient agreed with the plan and demonstrated an understanding of the instructions.   The patient was advised to call back or seek an in-person evaluation if the symptoms worsen or if the condition fails to improve as anticipated.   I provided 15 minutes of time during this encounter.  Follow up plan: Return in about 4 months  (around 11/14/2019) for HTN/HLD and Mood.

## 2019-07-17 NOTE — Patient Instructions (Signed)
DASH Eating Plan DASH stands for "Dietary Approaches to Stop Hypertension." The DASH eating plan is a healthy eating plan that has been shown to reduce high blood pressure (hypertension). It may also reduce your risk for type 2 diabetes, heart disease, and stroke. The DASH eating plan may also help with weight loss. What are tips for following this plan?  General guidelines  Avoid eating more than 2,300 mg (milligrams) of salt (sodium) a day. If you have hypertension, you may need to reduce your sodium intake to 1,500 mg a day.  Limit alcohol intake to no more than 1 drink a day for nonpregnant women and 2 drinks a day for men. One drink equals 12 oz of beer, 5 oz of wine, or 1 oz of hard liquor.  Work with your health care provider to maintain a healthy body weight or to lose weight. Ask what an ideal weight is for you.  Get at least 30 minutes of exercise that causes your heart to beat faster (aerobic exercise) most days of the week. Activities may include walking, swimming, or biking.  Work with your health care provider or diet and nutrition specialist (dietitian) to adjust your eating plan to your individual calorie needs. Reading food labels   Check food labels for the amount of sodium per serving. Choose foods with less than 5 percent of the Daily Value of sodium. Generally, foods with less than 300 mg of sodium per serving fit into this eating plan.  To find whole grains, look for the word "whole" as the first word in the ingredient list. Shopping  Buy products labeled as "low-sodium" or "no salt added."  Buy fresh foods. Avoid canned foods and premade or frozen meals. Cooking  Avoid adding salt when cooking. Use salt-free seasonings or herbs instead of table salt or sea salt. Check with your health care provider or pharmacist before using salt substitutes.  Do not fry foods. Cook foods using healthy methods such as baking, boiling, grilling, and broiling instead.  Cook with  heart-healthy oils, such as olive, canola, soybean, or sunflower oil. Meal planning  Eat a balanced diet that includes: ? 5 or more servings of fruits and vegetables each day. At each meal, try to fill half of your plate with fruits and vegetables. ? Up to 6-8 servings of whole grains each day. ? Less than 6 oz of lean meat, poultry, or fish each day. A 3-oz serving of meat is about the same size as a deck of cards. One egg equals 1 oz. ? 2 servings of low-fat dairy each day. ? A serving of nuts, seeds, or beans 5 times each week. ? Heart-healthy fats. Healthy fats called Omega-3 fatty acids are found in foods such as flaxseeds and coldwater fish, like sardines, salmon, and mackerel.  Limit how much you eat of the following: ? Canned or prepackaged foods. ? Food that is high in trans fat, such as fried foods. ? Food that is high in saturated fat, such as fatty meat. ? Sweets, desserts, sugary drinks, and other foods with added sugar. ? Full-fat dairy products.  Do not salt foods before eating.  Try to eat at least 2 vegetarian meals each week.  Eat more home-cooked food and less restaurant, buffet, and fast food.  When eating at a restaurant, ask that your food be prepared with less salt or no salt, if possible. What foods are recommended? The items listed may not be a complete list. Talk with your dietitian about   what dietary choices are best for you. Grains Whole-grain or whole-wheat bread. Whole-grain or whole-wheat pasta. Brown rice. Oatmeal. Quinoa. Bulgur. Whole-grain and low-sodium cereals. Pita bread. Low-fat, low-sodium crackers. Whole-wheat flour tortillas. Vegetables Fresh or frozen vegetables (raw, steamed, roasted, or grilled). Low-sodium or reduced-sodium tomato and vegetable juice. Low-sodium or reduced-sodium tomato sauce and tomato paste. Low-sodium or reduced-sodium canned vegetables. Fruits All fresh, dried, or frozen fruit. Canned fruit in natural juice (without  added sugar). Meat and other protein foods Skinless chicken or turkey. Ground chicken or turkey. Pork with fat trimmed off. Fish and seafood. Egg whites. Dried beans, peas, or lentils. Unsalted nuts, nut butters, and seeds. Unsalted canned beans. Lean cuts of beef with fat trimmed off. Low-sodium, lean deli meat. Dairy Low-fat (1%) or fat-free (skim) milk. Fat-free, low-fat, or reduced-fat cheeses. Nonfat, low-sodium ricotta or cottage cheese. Low-fat or nonfat yogurt. Low-fat, low-sodium cheese. Fats and oils Soft margarine without trans fats. Vegetable oil. Low-fat, reduced-fat, or light mayonnaise and salad dressings (reduced-sodium). Canola, safflower, olive, soybean, and sunflower oils. Avocado. Seasoning and other foods Herbs. Spices. Seasoning mixes without salt. Unsalted popcorn and pretzels. Fat-free sweets. What foods are not recommended? The items listed may not be a complete list. Talk with your dietitian about what dietary choices are best for you. Grains Baked goods made with fat, such as croissants, muffins, or some breads. Dry pasta or rice meal packs. Vegetables Creamed or fried vegetables. Vegetables in a cheese sauce. Regular canned vegetables (not low-sodium or reduced-sodium). Regular canned tomato sauce and paste (not low-sodium or reduced-sodium). Regular tomato and vegetable juice (not low-sodium or reduced-sodium). Pickles. Olives. Fruits Canned fruit in a light or heavy syrup. Fried fruit. Fruit in cream or butter sauce. Meat and other protein foods Fatty cuts of meat. Ribs. Fried meat. Bacon. Sausage. Bologna and other processed lunch meats. Salami. Fatback. Hotdogs. Bratwurst. Salted nuts and seeds. Canned beans with added salt. Canned or smoked fish. Whole eggs or egg yolks. Chicken or turkey with skin. Dairy Whole or 2% milk, cream, and half-and-half. Whole or full-fat cream cheese. Whole-fat or sweetened yogurt. Full-fat cheese. Nondairy creamers. Whipped toppings.  Processed cheese and cheese spreads. Fats and oils Butter. Stick margarine. Lard. Shortening. Ghee. Bacon fat. Tropical oils, such as coconut, palm kernel, or palm oil. Seasoning and other foods Salted popcorn and pretzels. Onion salt, garlic salt, seasoned salt, table salt, and sea salt. Worcestershire sauce. Tartar sauce. Barbecue sauce. Teriyaki sauce. Soy sauce, including reduced-sodium. Steak sauce. Canned and packaged gravies. Fish sauce. Oyster sauce. Cocktail sauce. Horseradish that you find on the shelf. Ketchup. Mustard. Meat flavorings and tenderizers. Bouillon cubes. Hot sauce and Tabasco sauce. Premade or packaged marinades. Premade or packaged taco seasonings. Relishes. Regular salad dressings. Where to find more information:  National Heart, Lung, and Blood Institute: www.nhlbi.nih.gov  American Heart Association: www.heart.org Summary  The DASH eating plan is a healthy eating plan that has been shown to reduce high blood pressure (hypertension). It may also reduce your risk for type 2 diabetes, heart disease, and stroke.  With the DASH eating plan, you should limit salt (sodium) intake to 2,300 mg a day. If you have hypertension, you may need to reduce your sodium intake to 1,500 mg a day.  When on the DASH eating plan, aim to eat more fresh fruits and vegetables, whole grains, lean proteins, low-fat dairy, and heart-healthy fats.  Work with your health care provider or diet and nutrition specialist (dietitian) to adjust your eating plan to your   individual calorie needs. This information is not intended to replace advice given to you by your health care provider. Make sure you discuss any questions you have with your health care provider. Document Revised: 04/30/2017 Document Reviewed: 05/11/2016 Elsevier Patient Education  2020 [employer]  

## 2019-07-18 ENCOUNTER — Other Ambulatory Visit: Payer: Self-pay | Admitting: Nurse Practitioner

## 2019-07-18 NOTE — Progress Notes (Signed)
Lvm to make this 4 month f/u

## 2019-07-21 ENCOUNTER — Encounter: Payer: Self-pay | Admitting: Nurse Practitioner

## 2019-07-21 NOTE — Progress Notes (Signed)
Sent letter

## 2019-07-21 NOTE — Progress Notes (Signed)
Lvm to make a 4 month f/u

## 2019-07-28 DIAGNOSIS — J301 Allergic rhinitis due to pollen: Secondary | ICD-10-CM | POA: Diagnosis not present

## 2019-08-04 DIAGNOSIS — J301 Allergic rhinitis due to pollen: Secondary | ICD-10-CM | POA: Diagnosis not present

## 2019-08-07 ENCOUNTER — Encounter: Payer: Self-pay | Admitting: Nurse Practitioner

## 2019-08-07 ENCOUNTER — Telehealth: Payer: Self-pay | Admitting: Nurse Practitioner

## 2019-08-07 NOTE — Telephone Encounter (Signed)
-----   Message from [name], NP sent at 07/17/2019  3:02 PM EST ----- 4 months for follow-up

## 2019-08-07 NOTE — Telephone Encounter (Signed)
LVM for pt to schedule an appt.

## 2019-08-18 DIAGNOSIS — J301 Allergic rhinitis due to pollen: Secondary | ICD-10-CM | POA: Diagnosis not present

## 2019-08-23 ENCOUNTER — Other Ambulatory Visit: Payer: Self-pay | Admitting: Nurse Practitioner

## 2019-08-29 NOTE — Telephone Encounter (Signed)
LVM for pt to call back.

## 2019-09-08 DIAGNOSIS — J301 Allergic rhinitis due to pollen: Secondary | ICD-10-CM | POA: Diagnosis not present

## 2019-09-15 DIAGNOSIS — J301 Allergic rhinitis due to pollen: Secondary | ICD-10-CM | POA: Diagnosis not present

## 2019-09-18 NOTE — Telephone Encounter (Signed)
LVM for pt to call back.

## 2019-09-19 DIAGNOSIS — J301 Allergic rhinitis due to pollen: Secondary | ICD-10-CM | POA: Diagnosis not present

## 2019-09-22 DIAGNOSIS — J301 Allergic rhinitis due to pollen: Secondary | ICD-10-CM | POA: Diagnosis not present

## 2019-10-06 DIAGNOSIS — J301 Allergic rhinitis due to pollen: Secondary | ICD-10-CM | POA: Diagnosis not present

## 2019-10-20 DIAGNOSIS — J301 Allergic rhinitis due to pollen: Secondary | ICD-10-CM | POA: Diagnosis not present

## 2019-10-26 ENCOUNTER — Other Ambulatory Visit: Payer: Self-pay | Admitting: Nurse Practitioner

## 2019-10-27 DIAGNOSIS — J301 Allergic rhinitis due to pollen: Secondary | ICD-10-CM | POA: Diagnosis not present

## 2019-11-03 DIAGNOSIS — J301 Allergic rhinitis due to pollen: Secondary | ICD-10-CM | POA: Diagnosis not present

## 2019-11-10 DIAGNOSIS — J301 Allergic rhinitis due to pollen: Secondary | ICD-10-CM | POA: Diagnosis not present

## 2019-11-17 DIAGNOSIS — J301 Allergic rhinitis due to pollen: Secondary | ICD-10-CM | POA: Diagnosis not present

## 2019-11-24 DIAGNOSIS — J301 Allergic rhinitis due to pollen: Secondary | ICD-10-CM | POA: Diagnosis not present

## 2019-12-01 DIAGNOSIS — J301 Allergic rhinitis due to pollen: Secondary | ICD-10-CM | POA: Diagnosis not present

## 2019-12-13 DIAGNOSIS — J301 Allergic rhinitis due to pollen: Secondary | ICD-10-CM | POA: Diagnosis not present

## 2019-12-15 DIAGNOSIS — J301 Allergic rhinitis due to pollen: Secondary | ICD-10-CM | POA: Diagnosis not present

## 2019-12-29 DIAGNOSIS — J301 Allergic rhinitis due to pollen: Secondary | ICD-10-CM | POA: Diagnosis not present

## 2020-01-12 DIAGNOSIS — J301 Allergic rhinitis due to pollen: Secondary | ICD-10-CM | POA: Diagnosis not present

## 2020-01-19 ENCOUNTER — Ambulatory Visit (INDEPENDENT_AMBULATORY_CARE_PROVIDER_SITE_OTHER): Payer: BC Managed Care – PPO | Admitting: Nurse Practitioner

## 2020-01-19 ENCOUNTER — Encounter: Payer: Self-pay | Admitting: Nurse Practitioner

## 2020-01-19 ENCOUNTER — Other Ambulatory Visit: Payer: Self-pay

## 2020-01-19 DIAGNOSIS — J301 Allergic rhinitis due to pollen: Secondary | ICD-10-CM | POA: Diagnosis not present

## 2020-01-19 DIAGNOSIS — I10 Essential (primary) hypertension: Secondary | ICD-10-CM

## 2020-01-19 DIAGNOSIS — E559 Vitamin D deficiency, unspecified: Secondary | ICD-10-CM | POA: Diagnosis not present

## 2020-01-19 DIAGNOSIS — F3342 Major depressive disorder, recurrent, in full remission: Secondary | ICD-10-CM | POA: Diagnosis not present

## 2020-01-19 DIAGNOSIS — E7800 Pure hypercholesterolemia, unspecified: Secondary | ICD-10-CM | POA: Diagnosis not present

## 2020-01-19 NOTE — Progress Notes (Signed)
BP 134/72   Pulse 65   Temp 98 F (36.7 C) (Oral)   Wt 195 lb (88.5 kg)   SpO2 94%   BMI 25.29 kg/m    Subjective:    Patient ID: Jonathan Roberson, male    DOB: Jun 22, 1955, 64 y.o.   MRN: [id number]  HPI: Jonathan Roberson is a 64 y.o. male  Chief Complaint  Patient presents with  . Depression  . Hypertension    HYPERTENSION / HYPERLIPIDEMIA Taking Amlodipine 10 MG daily (takes Amlodipine 5 MG at night and 5 MG in morning).  No medications for cholesterol, does not wish to start -- last LDL 129 and TCHOL 229.  He tried Lisinopril in past for BP, but it made him feel squirely + tried Losartan and this made him feel weird as well.  Reports he goes hiking a lot when weather better, takes his dog with him.   Satisfied with current treatment? yes Duration of hypertension: chronic BP monitoring frequency: not checking BP range:  BP medication side effects: no Duration of hyperlipidemia: chronic Cholesterol supplements: fish oil and specific bran muffins that help with cholesterol Aspirin: yes Recent stressors: no Recurrent headaches: no Visual changes: no Palpitations: no Dyspnea: no Chest pain: no Lower extremity edema: occasional from medication The 10-year ASCVD risk score [name], et al., 2013) is: 11.1%   Values used to calculate the score:     Age: 54 years     Sex: Male     Is Non-Hispanic African American: No     Diabetic: No     Tobacco smoker: No     Systolic Blood Pressure: 384 mmHg     Is BP treated: Yes     HDL Cholesterol: 83 mg/dL     Total Cholesterol: 229 mg/dL  DEPRESSION Continues on Paxil 20 MG, last BMP noted sodium 131 -- does not wish to recheck until next visit.  Depression is better than it has been in 20 years, feels like some of the aging process has helped this.  Did have low Vitamin D on labs in January, 26.2.  He would like testosterone check at physical in 5 months as concerned about low levels, due to fatigue and decreased sex  drive. Mood status: stable Satisfied with current treatment?: yes Symptom severity: mild  Duration of current treatment : chronic Side effects: no Medication compliance: good compliance Psychotherapy/counseling: none Depressed mood: occasional Anxious mood: no Anhedonia: no Significant weight loss or gain: no Insomnia:  none Fatigue: no Feelings of worthlessness or guilt: no Impaired concentration/indecisiveness: occasional Suicidal ideations: no Hopelessness: no Crying spells: no Depression screen [facility] 2/9 01/19/2020 06/19/2019 12/09/2018 08/10/2018 12/21/2017  Decreased Interest 1 1 0 1 0  Down, Depressed, Hopeless 1 0 0 0 0  PHQ - 2 Score 2 1 0 1 0  Altered sleeping 0 1 0 0 0  Tired, decreased energy 1 1 1 1  0  Change in appetite 0 0 0 0 0  Feeling bad or failure about yourself  0 0 0 0 0  Trouble concentrating 1 1 0 0 0  Moving slowly or fidgety/restless 0 0 0 0 0  Suicidal thoughts 0 0 0 0 0  PHQ-9 Score 4 4 1 2  0  Difficult doing work/chores Not difficult at all Not difficult at all Not difficult at all Not difficult at all -    Relevant past medical, surgical, family and social history reviewed and updated as indicated. Interim medical history since  our last visit reviewed. Allergies and medications reviewed and updated.  Review of Systems  Constitutional: Negative for activity change, diaphoresis, fatigue and fever.  Respiratory: Negative for cough, chest tightness, shortness of breath and wheezing.   Cardiovascular: Negative for chest pain, palpitations and leg swelling.  Gastrointestinal: Negative.   Neurological: Negative.   Psychiatric/Behavioral: Negative.     Per HPI unless specifically indicated above     Objective:    BP 134/72   Pulse 65   Temp 98 F (36.7 C) (Oral)   Wt 195 lb (88.5 kg)   SpO2 94%   BMI 25.29 kg/m   Wt Readings from Last 3 Encounters:  01/19/20 195 lb (88.5 kg)  06/19/19 196 lb 3.2 oz (89 kg)  12/09/18 185 lb (83.9 kg)     Physical Exam Vitals and nursing note reviewed.  Constitutional:      General: He is awake. He is not in acute distress.    Appearance: He is well-developed and well-groomed. He is not ill-appearing.  HENT:     Head: Normocephalic and atraumatic.     Right Ear: Hearing normal. No drainage.     Left Ear: Hearing normal. No drainage.  Eyes:     General: Lids are normal.        Right eye: No discharge.        Left eye: No discharge.     Conjunctiva/sclera: Conjunctivae normal.     Pupils: Pupils are equal, round, and reactive to light.  Neck:     Thyroid: No thyromegaly.     Vascular: No carotid bruit.     Trachea: Trachea normal.  Cardiovascular:     Rate and Rhythm: Normal rate and regular rhythm.     Heart sounds: Normal heart sounds, S1 normal and S2 normal. No murmur heard.  No gallop.   Pulmonary:     Effort: Pulmonary effort is normal. No accessory muscle usage or respiratory distress.     Breath sounds: Normal breath sounds.  Abdominal:     General: Bowel sounds are normal.     Palpations: Abdomen is soft. There is no hepatomegaly or splenomegaly.  Musculoskeletal:        General: Normal range of motion.     Cervical back: Normal range of motion and neck supple.     Right lower leg: No edema.     Left lower leg: No edema.  Skin:    General: Skin is warm and dry.     Capillary Refill: Capillary refill takes less than 2 seconds.  Neurological:     Mental Status: He is alert and oriented to person, place, and time.  Psychiatric:        Mood and Affect: Mood normal.        Speech: Speech normal.        Behavior: Behavior normal. Behavior is cooperative.        Thought Content: Thought content normal.     Results for orders placed or performed in visit on 06/19/19  CBC with Differential/Platelet  Result Value Ref Range   WBC 4.5 3.4 - 10.8 x10E3/uL   RBC 4.35 4.14 - 5.80 x10E6/uL   Hemoglobin 13.7 13.0 - 17.7 g/dL   Hematocrit 40.7 37.5 - 51.0 %   MCV 94 79 -  97 fL   MCH 31.5 26.6 - 33.0 pg   MCHC 33.7 31 - 35 g/dL   RDW 12.0 11.6 - 15.4 %   Platelets 179 150 - 450 x10E3/uL  Neutrophils 52 Not Estab. %   Lymphs 28 Not Estab. %   Monocytes 12 Not Estab. %   Eos 7 Not Estab. %   Basos 1 Not Estab. %   Neutrophils Absolute 2.4 1 - 7 x10E3/uL   Lymphocytes Absolute 1.2 0 - 3 x10E3/uL   Monocytes Absolute 0.5 0 - 0 x10E3/uL   EOS (ABSOLUTE) 0.3 0.0 - 0.4 x10E3/uL   Basophils Absolute 0.1 0 - 0 x10E3/uL   Immature Granulocytes 0 Not Estab. %   Immature Grans (Abs) 0.0 0.0 - 0.1 x10E3/uL  Comprehensive metabolic panel  Result Value Ref Range   Glucose 84 65 - 99 mg/dL   BUN 10 8 - 27 mg/dL   Creatinine, Ser 0.64 (L) 0.76 - 1.27 mg/dL   GFR calc non Af Amer 104 >59 mL/min/1.73   GFR calc Af Amer 120 >59 mL/min/1.73   BUN/Creatinine Ratio 16 10 - 24   Sodium 131 (L) 134 - 144 mmol/L   Potassium 3.6 3.5 - 5.2 mmol/L   Chloride 91 (L) 96 - 106 mmol/L   CO2 26 20 - 29 mmol/L   Calcium 8.8 8.6 - 10.2 mg/dL   Total Protein 7.6 6.0 - 8.5 g/dL   Albumin 4.5 3.8 - 4.8 g/dL   Globulin, Total 3.1 1.5 - 4.5 g/dL   Albumin/Globulin Ratio 1.5 1.2 - 2.2   Bilirubin Total 0.8 0.0 - 1.2 mg/dL   Alkaline Phosphatase 61 39 - 117 IU/L   AST 22 0 - 40 IU/L   ALT 16 0 - 44 IU/L  TSH  Result Value Ref Range   TSH 0.675 0.450 - 4.500 uIU/mL  PSA  Result Value Ref Range   Prostate Specific Ag, Serum 3.1 0.0 - 4.0 ng/mL  Lipid Panel w/o Chol/HDL Ratio  Result Value Ref Range   Cholesterol, Total 229 (H) 100 - 199 mg/dL   Triglycerides 100 0 - 149 mg/dL   HDL 83 >39 mg/dL   VLDL Cholesterol Cal 17 5 - 40 mg/dL   LDL Chol Calc (NIH) 129 (H) 0 - 99 mg/dL  VITAMIN D 25 Hydroxy (Vit-D Deficiency, Fractures)  Result Value Ref Range   Vit D, 25-Hydroxy 26.2 (L) 30.0 - 100.0 ng/mL      Assessment & Plan:   Problem List Items Addressed This Visit      Cardiovascular and Mediastinum   Hypertension    Chronic, ongoing.  Continue Amlodipine 5 MG BID  (patient preference), he tolerated Losartan and Lisinopril poorly.  Continue to monitor BP at home and document. DASH diet at home.  Plan on labs next visit, does not wish to obtain today.  Return in 5 months.        Other   Hyperlipidemia    Chronic, ongoing.  He refuses statin at this time and wishes to continue to focus on lifestyle changes.  Discussed risk with current ASCVD score and recommendations for statin.  Will recheck panel next visit, he does not wish labs today.        Major depression in full remission (HCC)    Chronic, stable.  Continue current medication regimen and adjust as needed. Denies SI/HI.  Follow-up in 5 months for mood.      Vitamin D deficiency    Check Vit D level next visit and recommend continue daily supplement.          Follow up plan: Return in about 5 months (around 06/20/2020) for Annual physical.

## 2020-01-19 NOTE — Patient Instructions (Signed)
DASH Eating Plan DASH stands for "Dietary Approaches to Stop Hypertension." The DASH eating plan is a healthy eating plan that has been shown to reduce high blood pressure (hypertension). It may also reduce your risk for type 2 diabetes, heart disease, and stroke. The DASH eating plan may also help with weight loss. What are tips for following this plan?  General guidelines  Avoid eating more than 2,300 mg (milligrams) of salt (sodium) a day. If you have hypertension, you may need to reduce your sodium intake to 1,500 mg a day.  Limit alcohol intake to no more than 1 drink a day for nonpregnant women and 2 drinks a day for men. One drink equals 12 oz of beer, 5 oz of wine, or 1 oz of hard liquor.  Work with your health care provider to maintain a healthy body weight or to lose weight. Ask what an ideal weight is for you.  Get at least 30 minutes of exercise that causes your heart to beat faster (aerobic exercise) most days of the week. Activities may include walking, swimming, or biking.  Work with your health care provider or diet and nutrition specialist (dietitian) to adjust your eating plan to your individual calorie needs. Reading food labels   Check food labels for the amount of sodium per serving. Choose foods with less than 5 percent of the Daily Value of sodium. Generally, foods with less than 300 mg of sodium per serving fit into this eating plan.  To find whole grains, look for the word "whole" as the first word in the ingredient list. Shopping  Buy products labeled as "low-sodium" or "no salt added."  Buy fresh foods. Avoid canned foods and premade or frozen meals. Cooking  Avoid adding salt when cooking. Use salt-free seasonings or herbs instead of table salt or sea salt. Check with your health care provider or pharmacist before using salt substitutes.  Do not fry foods. Cook foods using healthy methods such as baking, boiling, grilling, and broiling instead.  Cook with  heart-healthy oils, such as olive, canola, soybean, or sunflower oil. Meal planning  Eat a balanced diet that includes: ? 5 or more servings of fruits and vegetables each day. At each meal, try to fill half of your plate with fruits and vegetables. ? Up to 6-8 servings of whole grains each day. ? Less than 6 oz of lean meat, poultry, or fish each day. A 3-oz serving of meat is about the same size as a deck of cards. One egg equals 1 oz. ? 2 servings of low-fat dairy each day. ? A serving of nuts, seeds, or beans 5 times each week. ? Heart-healthy fats. Healthy fats called Omega-3 fatty acids are found in foods such as flaxseeds and coldwater fish, like sardines, salmon, and mackerel.  Limit how much you eat of the following: ? Canned or prepackaged foods. ? Food that is high in trans fat, such as fried foods. ? Food that is high in saturated fat, such as fatty meat. ? Sweets, desserts, sugary drinks, and other foods with added sugar. ? Full-fat dairy products.  Do not salt foods before eating.  Try to eat at least 2 vegetarian meals each week.  Eat more home-cooked food and less restaurant, buffet, and fast food.  When eating at a restaurant, ask that your food be prepared with less salt or no salt, if possible. What foods are recommended? The items listed may not be a complete list. Talk with your dietitian about   what dietary choices are best for you. Grains Whole-grain or whole-wheat bread. Whole-grain or whole-wheat pasta. Brown rice. Oatmeal. Quinoa. Bulgur. Whole-grain and low-sodium cereals. Pita bread. Low-fat, low-sodium crackers. Whole-wheat flour tortillas. Vegetables Fresh or frozen vegetables (raw, steamed, roasted, or grilled). Low-sodium or reduced-sodium tomato and vegetable juice. Low-sodium or reduced-sodium tomato sauce and tomato paste. Low-sodium or reduced-sodium canned vegetables. Fruits All fresh, dried, or frozen fruit. Canned fruit in natural juice (without  added sugar). Meat and other protein foods Skinless chicken or turkey. Ground chicken or turkey. Pork with fat trimmed off. Fish and seafood. Egg whites. Dried beans, peas, or lentils. Unsalted nuts, nut butters, and seeds. Unsalted canned beans. Lean cuts of beef with fat trimmed off. Low-sodium, lean deli meat. Dairy Low-fat (1%) or fat-free (skim) milk. Fat-free, low-fat, or reduced-fat cheeses. Nonfat, low-sodium ricotta or cottage cheese. Low-fat or nonfat yogurt. Low-fat, low-sodium cheese. Fats and oils Soft margarine without trans fats. Vegetable oil. Low-fat, reduced-fat, or light mayonnaise and salad dressings (reduced-sodium). Canola, safflower, olive, soybean, and sunflower oils. Avocado. Seasoning and other foods Herbs. Spices. Seasoning mixes without salt. Unsalted popcorn and pretzels. Fat-free sweets. What foods are not recommended? The items listed may not be a complete list. Talk with your dietitian about what dietary choices are best for you. Grains Baked goods made with fat, such as croissants, muffins, or some breads. Dry pasta or rice meal packs. Vegetables Creamed or fried vegetables. Vegetables in a cheese sauce. Regular canned vegetables (not low-sodium or reduced-sodium). Regular canned tomato sauce and paste (not low-sodium or reduced-sodium). Regular tomato and vegetable juice (not low-sodium or reduced-sodium). Pickles. Olives. Fruits Canned fruit in a light or heavy syrup. Fried fruit. Fruit in cream or butter sauce. Meat and other protein foods Fatty cuts of meat. Ribs. Fried meat. Bacon. Sausage. Bologna and other processed lunch meats. Salami. Fatback. Hotdogs. Bratwurst. Salted nuts and seeds. Canned beans with added salt. Canned or smoked fish. Whole eggs or egg yolks. Chicken or turkey with skin. Dairy Whole or 2% milk, cream, and half-and-half. Whole or full-fat cream cheese. Whole-fat or sweetened yogurt. Full-fat cheese. Nondairy creamers. Whipped toppings.  Processed cheese and cheese spreads. Fats and oils Butter. Stick margarine. Lard. Shortening. Ghee. Bacon fat. Tropical oils, such as coconut, palm kernel, or palm oil. Seasoning and other foods Salted popcorn and pretzels. Onion salt, garlic salt, seasoned salt, table salt, and sea salt. Worcestershire sauce. Tartar sauce. Barbecue sauce. Teriyaki sauce. Soy sauce, including reduced-sodium. Steak sauce. Canned and packaged gravies. Fish sauce. Oyster sauce. Cocktail sauce. Horseradish that you find on the shelf. Ketchup. Mustard. Meat flavorings and tenderizers. Bouillon cubes. Hot sauce and Tabasco sauce. Premade or packaged marinades. Premade or packaged taco seasonings. Relishes. Regular salad dressings. Where to find more information:  National Heart, Lung, and Blood Institute: www.nhlbi.nih.gov  American Heart Association: www.heart.org Summary  The DASH eating plan is a healthy eating plan that has been shown to reduce high blood pressure (hypertension). It may also reduce your risk for type 2 diabetes, heart disease, and stroke.  With the DASH eating plan, you should limit salt (sodium) intake to 2,300 mg a day. If you have hypertension, you may need to reduce your sodium intake to 1,500 mg a day.  When on the DASH eating plan, aim to eat more fresh fruits and vegetables, whole grains, lean proteins, low-fat dairy, and heart-healthy fats.  Work with your health care provider or diet and nutrition specialist (dietitian) to adjust your eating plan to your   individual calorie needs. This information is not intended to replace advice given to you by your health care provider. Make sure you discuss any questions you have with your health care provider. Document Revised: 04/30/2017 Document Reviewed: 05/11/2016 Elsevier Patient Education  2020 [employer]  

## 2020-01-19 NOTE — Assessment & Plan Note (Signed)
Chronic, stable.  Continue current medication regimen and adjust as needed. Denies SI/HI.  Follow-up in 5 months for mood.

## 2020-01-19 NOTE — Assessment & Plan Note (Signed)
Chronic, ongoing.  He refuses statin at this time and wishes to continue to focus on lifestyle changes.  Discussed risk with current ASCVD score and recommendations for statin.  Will recheck panel next visit, he does not wish labs today.

## 2020-01-19 NOTE — Assessment & Plan Note (Signed)
Check Vit D level next visit and recommend continue daily supplement.

## 2020-01-19 NOTE — Assessment & Plan Note (Addendum)
Chronic, ongoing.  Continue Amlodipine 5 MG BID (patient preference), he tolerated Losartan and Lisinopril poorly.  Continue to monitor BP at home and document. DASH diet at home.  Plan on labs next visit, does not wish to obtain today.  Return in 5 months.

## 2020-01-24 DIAGNOSIS — J301 Allergic rhinitis due to pollen: Secondary | ICD-10-CM | POA: Diagnosis not present

## 2020-01-24 DIAGNOSIS — J330 Polyp of nasal cavity: Secondary | ICD-10-CM | POA: Diagnosis not present

## 2020-01-26 DIAGNOSIS — J301 Allergic rhinitis due to pollen: Secondary | ICD-10-CM | POA: Diagnosis not present

## 2020-01-29 DIAGNOSIS — M76821 Posterior tibial tendinitis, right leg: Secondary | ICD-10-CM | POA: Diagnosis not present

## 2020-01-29 DIAGNOSIS — M76822 Posterior tibial tendinitis, left leg: Secondary | ICD-10-CM | POA: Diagnosis not present

## 2020-02-02 DIAGNOSIS — J301 Allergic rhinitis due to pollen: Secondary | ICD-10-CM | POA: Diagnosis not present

## 2020-02-14 ENCOUNTER — Other Ambulatory Visit: Payer: Self-pay

## 2020-02-14 ENCOUNTER — Other Ambulatory Visit: Payer: BLUE CROSS/BLUE SHIELD

## 2020-02-14 DIAGNOSIS — Z20822 Contact with and (suspected) exposure to covid-19: Secondary | ICD-10-CM | POA: Diagnosis not present

## 2020-02-15 ENCOUNTER — Ambulatory Visit: Payer: Self-pay

## 2020-02-15 LAB — NOVEL CORONAVIRUS, NAA: SARS-CoV-2, NAA: DETECTED — AB

## 2020-02-15 LAB — SARS-COV-2, NAA 2 DAY TAT

## 2020-02-15 NOTE — Telephone Encounter (Signed)
Patient called after reading his COVID-19 test on My Chart. He tested positive 02/14/20.  Patient states that he has asthma and he was having issues with congestion he thought was the start of bronchitis.He has been using his inhalers and steroids per instruction of PCP for asthma flares.  Today he states he has a mild cough and headache.  He states he feels his congestion has improved.  Denies fever. He is fully vaccinated. Symptom tiers and treatment plans were read to patient and his wife. Criteria for ending isolation were read to patient and his wife. Good preventative practices were discussed. Mrs Heitman states no reason for him to Isolate in the home because she has already been all around him.  She will quarantine and care for him.  She will test if symptoms develop. Per protocol call placed to office. Will route note for follow up as instructed. Patient is aware and will wait to speak with practice. They are wanting information on different treatment options. HD has been notified   Reason for Disposition . HIGH RISK for severe COVID complications (e.g., age > 37 years, obesity with BMI > 25, pregnant, chronic lung disease or other chronic medical condition)  (Exception: Already seen by PCP and no new or worsening symptoms.)  Answer Assessment - Initial Assessment Questions 1. COVID-19 DIAGNOSIS: "Who made your Coronavirus (COVID-19) diagnosis?" "Was it confirmed by a positive lab test?" If not diagnosed by a HCP, ask "Are there lots of cases (community spread) where you live?" (See public health department website, if unsure)     yes 2. COVID-19 EXPOSURE: "Was there any known exposure to COVID before the symptoms began?" CDC Definition of close contact: within 6 feet (2 meters) for a total of 15 minutes or more over a 24-hour period.      Test positive yesterday 3. ONSET: "When did the COVID-19 symptoms start?"      Sunday tired Monday 4. WORST SYMPTOM: "What is your worst symptom?" (e.g., cough,  fever, shortness of breath, muscle aches)     Congestion 5. COUGH: "Do you have a cough?" If Yes, ask: "How bad is the cough?"       Mild not today 6. FEVER: "Do you have a fever?" If Yes, ask: "What is your temperature, how was it measured, and when did it start?"    No 7. RESPIRATORY STATUS: "Describe your breathing?" (e.g., shortness of breath, wheezing, unable to speak)      no 8. BETTER-SAME-WORSE: "Are you getting better, staying the same or getting worse compared to yesterday?"  If getting worse, ask, "In what way?"     better 9. HIGH RISK DISEASE: "Do you have any chronic medical problems?" (e.g., asthma, heart or lung disease, weak immune system, obesity, etc.)    Asthma 10. PREGNANCY: "Is there any chance you are pregnant?" "When was your last menstrual period?"      N/A 11. OTHER SYMPTOMS: "Do you have any other symptoms?"  (e.g., chills, fatigue, headache, loss of smell or taste, muscle pain, sore throat; new loss of smell or taste especially support the diagnosis of COVID-19)      Fatigue, headache,  Protocols used: CORONAVIRUS (COVID-19) DIAGNOSED OR SUSPECTED-A-AH

## 2020-02-16 ENCOUNTER — Telehealth: Payer: Self-pay | Admitting: Unknown Physician Specialty

## 2020-02-16 NOTE — Telephone Encounter (Signed)
Pt with fatigue and headache related to Covid 19.  Recommended Vit C, Vit D, Quercetin and Zinc.

## 2020-02-16 NOTE — Telephone Encounter (Signed)
FYI

## 2020-03-01 DIAGNOSIS — J301 Allergic rhinitis due to pollen: Secondary | ICD-10-CM | POA: Diagnosis not present

## 2020-03-06 DIAGNOSIS — J301 Allergic rhinitis due to pollen: Secondary | ICD-10-CM | POA: Diagnosis not present

## 2020-03-15 DIAGNOSIS — J301 Allergic rhinitis due to pollen: Secondary | ICD-10-CM | POA: Diagnosis not present

## 2020-03-18 DIAGNOSIS — M9902 Segmental and somatic dysfunction of thoracic region: Secondary | ICD-10-CM | POA: Diagnosis not present

## 2020-03-18 DIAGNOSIS — M9901 Segmental and somatic dysfunction of cervical region: Secondary | ICD-10-CM | POA: Diagnosis not present

## 2020-03-18 DIAGNOSIS — M546 Pain in thoracic spine: Secondary | ICD-10-CM | POA: Diagnosis not present

## 2020-03-18 DIAGNOSIS — M5412 Radiculopathy, cervical region: Secondary | ICD-10-CM | POA: Diagnosis not present

## 2020-03-19 DIAGNOSIS — M9901 Segmental and somatic dysfunction of cervical region: Secondary | ICD-10-CM | POA: Diagnosis not present

## 2020-03-19 DIAGNOSIS — M5412 Radiculopathy, cervical region: Secondary | ICD-10-CM | POA: Diagnosis not present

## 2020-03-19 DIAGNOSIS — M9902 Segmental and somatic dysfunction of thoracic region: Secondary | ICD-10-CM | POA: Diagnosis not present

## 2020-03-19 DIAGNOSIS — M546 Pain in thoracic spine: Secondary | ICD-10-CM | POA: Diagnosis not present

## 2020-03-22 DIAGNOSIS — M5412 Radiculopathy, cervical region: Secondary | ICD-10-CM | POA: Diagnosis not present

## 2020-03-22 DIAGNOSIS — M9901 Segmental and somatic dysfunction of cervical region: Secondary | ICD-10-CM | POA: Diagnosis not present

## 2020-03-22 DIAGNOSIS — J301 Allergic rhinitis due to pollen: Secondary | ICD-10-CM | POA: Diagnosis not present

## 2020-03-22 DIAGNOSIS — M9902 Segmental and somatic dysfunction of thoracic region: Secondary | ICD-10-CM | POA: Diagnosis not present

## 2020-03-22 DIAGNOSIS — M546 Pain in thoracic spine: Secondary | ICD-10-CM | POA: Diagnosis not present

## 2020-03-25 DIAGNOSIS — M9901 Segmental and somatic dysfunction of cervical region: Secondary | ICD-10-CM | POA: Diagnosis not present

## 2020-03-25 DIAGNOSIS — M5412 Radiculopathy, cervical region: Secondary | ICD-10-CM | POA: Diagnosis not present

## 2020-03-25 DIAGNOSIS — M9902 Segmental and somatic dysfunction of thoracic region: Secondary | ICD-10-CM | POA: Diagnosis not present

## 2020-03-25 DIAGNOSIS — M546 Pain in thoracic spine: Secondary | ICD-10-CM | POA: Diagnosis not present

## 2020-03-27 DIAGNOSIS — M9901 Segmental and somatic dysfunction of cervical region: Secondary | ICD-10-CM | POA: Diagnosis not present

## 2020-03-27 DIAGNOSIS — M546 Pain in thoracic spine: Secondary | ICD-10-CM | POA: Diagnosis not present

## 2020-03-27 DIAGNOSIS — M9902 Segmental and somatic dysfunction of thoracic region: Secondary | ICD-10-CM | POA: Diagnosis not present

## 2020-03-27 DIAGNOSIS — M5412 Radiculopathy, cervical region: Secondary | ICD-10-CM | POA: Diagnosis not present

## 2020-03-29 DIAGNOSIS — M5412 Radiculopathy, cervical region: Secondary | ICD-10-CM | POA: Diagnosis not present

## 2020-03-29 DIAGNOSIS — M9901 Segmental and somatic dysfunction of cervical region: Secondary | ICD-10-CM | POA: Diagnosis not present

## 2020-03-29 DIAGNOSIS — M9902 Segmental and somatic dysfunction of thoracic region: Secondary | ICD-10-CM | POA: Diagnosis not present

## 2020-03-29 DIAGNOSIS — J301 Allergic rhinitis due to pollen: Secondary | ICD-10-CM | POA: Diagnosis not present

## 2020-03-29 DIAGNOSIS — M546 Pain in thoracic spine: Secondary | ICD-10-CM | POA: Diagnosis not present

## 2020-04-05 DIAGNOSIS — J301 Allergic rhinitis due to pollen: Secondary | ICD-10-CM | POA: Diagnosis not present

## 2020-04-05 DIAGNOSIS — M5412 Radiculopathy, cervical region: Secondary | ICD-10-CM | POA: Diagnosis not present

## 2020-04-05 DIAGNOSIS — M9901 Segmental and somatic dysfunction of cervical region: Secondary | ICD-10-CM | POA: Diagnosis not present

## 2020-04-05 DIAGNOSIS — M546 Pain in thoracic spine: Secondary | ICD-10-CM | POA: Diagnosis not present

## 2020-04-05 DIAGNOSIS — M9902 Segmental and somatic dysfunction of thoracic region: Secondary | ICD-10-CM | POA: Diagnosis not present

## 2020-04-09 DIAGNOSIS — M5412 Radiculopathy, cervical region: Secondary | ICD-10-CM | POA: Diagnosis not present

## 2020-04-09 DIAGNOSIS — M9901 Segmental and somatic dysfunction of cervical region: Secondary | ICD-10-CM | POA: Diagnosis not present

## 2020-04-09 DIAGNOSIS — M546 Pain in thoracic spine: Secondary | ICD-10-CM | POA: Diagnosis not present

## 2020-04-09 DIAGNOSIS — M9902 Segmental and somatic dysfunction of thoracic region: Secondary | ICD-10-CM | POA: Diagnosis not present

## 2020-04-12 DIAGNOSIS — M9902 Segmental and somatic dysfunction of thoracic region: Secondary | ICD-10-CM | POA: Diagnosis not present

## 2020-04-12 DIAGNOSIS — M546 Pain in thoracic spine: Secondary | ICD-10-CM | POA: Diagnosis not present

## 2020-04-12 DIAGNOSIS — M5412 Radiculopathy, cervical region: Secondary | ICD-10-CM | POA: Diagnosis not present

## 2020-04-12 DIAGNOSIS — J301 Allergic rhinitis due to pollen: Secondary | ICD-10-CM | POA: Diagnosis not present

## 2020-04-12 DIAGNOSIS — M9901 Segmental and somatic dysfunction of cervical region: Secondary | ICD-10-CM | POA: Diagnosis not present

## 2020-04-16 DIAGNOSIS — M9902 Segmental and somatic dysfunction of thoracic region: Secondary | ICD-10-CM | POA: Diagnosis not present

## 2020-04-16 DIAGNOSIS — M546 Pain in thoracic spine: Secondary | ICD-10-CM | POA: Diagnosis not present

## 2020-04-16 DIAGNOSIS — M9901 Segmental and somatic dysfunction of cervical region: Secondary | ICD-10-CM | POA: Diagnosis not present

## 2020-04-16 DIAGNOSIS — M5412 Radiculopathy, cervical region: Secondary | ICD-10-CM | POA: Diagnosis not present

## 2020-04-23 DIAGNOSIS — M5412 Radiculopathy, cervical region: Secondary | ICD-10-CM | POA: Diagnosis not present

## 2020-04-23 DIAGNOSIS — M9901 Segmental and somatic dysfunction of cervical region: Secondary | ICD-10-CM | POA: Diagnosis not present

## 2020-04-23 DIAGNOSIS — M546 Pain in thoracic spine: Secondary | ICD-10-CM | POA: Diagnosis not present

## 2020-04-23 DIAGNOSIS — M9902 Segmental and somatic dysfunction of thoracic region: Secondary | ICD-10-CM | POA: Diagnosis not present

## 2020-04-29 ENCOUNTER — Other Ambulatory Visit: Payer: Self-pay | Admitting: Nurse Practitioner

## 2020-04-30 DIAGNOSIS — M9901 Segmental and somatic dysfunction of cervical region: Secondary | ICD-10-CM | POA: Diagnosis not present

## 2020-04-30 DIAGNOSIS — M5412 Radiculopathy, cervical region: Secondary | ICD-10-CM | POA: Diagnosis not present

## 2020-04-30 DIAGNOSIS — M546 Pain in thoracic spine: Secondary | ICD-10-CM | POA: Diagnosis not present

## 2020-04-30 DIAGNOSIS — M9902 Segmental and somatic dysfunction of thoracic region: Secondary | ICD-10-CM | POA: Diagnosis not present

## 2020-05-03 ENCOUNTER — Other Ambulatory Visit: Payer: Self-pay | Admitting: Nurse Practitioner

## 2020-05-03 DIAGNOSIS — J301 Allergic rhinitis due to pollen: Secondary | ICD-10-CM | POA: Diagnosis not present

## 2020-05-03 MED ORDER — FLUTICASONE PROPIONATE 50 MCG/ACT NA SUSP
2.0000 | Freq: Every day | NASAL | 12 refills | Status: DC
Start: 2020-05-03 — End: 2022-01-26

## 2020-05-08 DIAGNOSIS — M5412 Radiculopathy, cervical region: Secondary | ICD-10-CM | POA: Diagnosis not present

## 2020-05-08 DIAGNOSIS — M9902 Segmental and somatic dysfunction of thoracic region: Secondary | ICD-10-CM | POA: Diagnosis not present

## 2020-05-08 DIAGNOSIS — M9901 Segmental and somatic dysfunction of cervical region: Secondary | ICD-10-CM | POA: Diagnosis not present

## 2020-05-08 DIAGNOSIS — M546 Pain in thoracic spine: Secondary | ICD-10-CM | POA: Diagnosis not present

## 2020-05-10 DIAGNOSIS — J301 Allergic rhinitis due to pollen: Secondary | ICD-10-CM | POA: Diagnosis not present

## 2020-05-21 DIAGNOSIS — J301 Allergic rhinitis due to pollen: Secondary | ICD-10-CM | POA: Diagnosis not present

## 2020-05-28 DIAGNOSIS — J301 Allergic rhinitis due to pollen: Secondary | ICD-10-CM | POA: Diagnosis not present

## 2020-05-29 DIAGNOSIS — J301 Allergic rhinitis due to pollen: Secondary | ICD-10-CM | POA: Diagnosis not present

## 2020-06-04 DIAGNOSIS — J301 Allergic rhinitis due to pollen: Secondary | ICD-10-CM | POA: Diagnosis not present

## 2020-06-07 ENCOUNTER — Other Ambulatory Visit: Payer: Self-pay | Admitting: Nurse Practitioner

## 2020-06-07 ENCOUNTER — Other Ambulatory Visit: Payer: Self-pay

## 2020-06-07 ENCOUNTER — Other Ambulatory Visit: Payer: BC Managed Care – PPO

## 2020-06-07 DIAGNOSIS — Z20822 Contact with and (suspected) exposure to covid-19: Secondary | ICD-10-CM

## 2020-06-10 ENCOUNTER — Other Ambulatory Visit: Payer: Self-pay | Admitting: Nurse Practitioner

## 2020-06-10 ENCOUNTER — Telehealth: Payer: Self-pay

## 2020-06-10 MED ORDER — ALBUTEROL SULFATE HFA 108 (90 BASE) MCG/ACT IN AERS: 2.0000 | INHALATION_SPRAY | Freq: Four times a day (QID) | RESPIRATORY_TRACT | 3 refills | Status: DC | PRN

## 2020-06-10 NOTE — Telephone Encounter (Signed)
PA started for Albuterol

## 2020-06-11 ENCOUNTER — Telehealth (INDEPENDENT_AMBULATORY_CARE_PROVIDER_SITE_OTHER): Payer: BC Managed Care – PPO | Admitting: Nurse Practitioner

## 2020-06-11 ENCOUNTER — Encounter: Payer: Self-pay | Admitting: Nurse Practitioner

## 2020-06-11 DIAGNOSIS — J069 Acute upper respiratory infection, unspecified: Secondary | ICD-10-CM | POA: Insufficient documentation

## 2020-06-11 LAB — NOVEL CORONAVIRUS, NAA: SARS-CoV-2, NAA: NOT DETECTED

## 2020-06-11 NOTE — Progress Notes (Signed)
There were no vitals taken for this visit.   Subjective:    Patient ID: Jonathan Roberson, male    DOB: 1955-12-16, 65 y.o.   MRN: [id number]  HPI: Jonathan Roberson is a 65 y.o. male  Chief Complaint  Patient presents with  . Wheezing    Covid??  Wife's test came back Negative States he is no longer having symptoms     . This visit was completed via telephone due to the restrictions of the COVID-19 pandemic. All issues as above were discussed and addressed but no physical exam was performed. If it was felt that the patient should be evaluated in the office, they were directed there. The patient verbally consented to this visit. Patient was unable to complete an audio/visual visit due to Technical difficulties,Lack of internet. Due to the catastrophic nature of the COVID-19 pandemic, this visit was done through audio contact only. . Location of the patient: home . Location of the provider: work . Those involved with this call:  . Provider: Marnee Guarneri, DNP . CMA: Yvonna Alanis, CMA . Front Desk/Registration: Jill Side  . Time spent on call: 21 minutes on the phone discussing health concerns. 15 minutes total spent in review of patient's record and preparation of their chart.  . I verified patient identity using two factors (patient name and date of birth). Patient consents verbally to being seen via telemedicine visit today.    UPPER RESPIRATORY TRACT INFECTION Started with symptoms on Wednesday with scratchy throat and then these progressed.  His wife tested negative for Covid.  Had Covid testing done on 06/07/20.  At this time symptoms have improved -- he started using his old Advair inhaler which offered benefit. Fever: no Cough: yes Shortness of breath: no Wheezing: a little bit Chest pain: no Chest tightness: no Chest congestion: no Nasal congestion: yes Runny nose: no Post nasal drip: no Sneezing: no Sore throat: no Swollen glands: no Sinus pressure:  improved Headache: no Face pain: no Toothache: no Ear pain: none Ear pressure: none Eyes red/itching:no Eye drainage/crusting: no  Vomiting: no Rash: no Fatigue: yes Sick contacts: yes Strep contacts: no  Context: better Recurrent sinusitis: no Relief with OTC cold/cough medications: yes  Treatments attempted: Advair, cold/sinus and cough syrup   Relevant past medical, surgical, family and social history reviewed and updated as indicated. Interim medical history since our last visit reviewed. Allergies and medications reviewed and updated.  Review of Systems  Constitutional: Negative for activity change, diaphoresis, fatigue and fever.  HENT: Positive for congestion (improving). Negative for postnasal drip, rhinorrhea, sinus pressure, sinus pain and sore throat.   Respiratory: Positive for cough (improving) and wheezing (improving). Negative for chest tightness and shortness of breath.   Cardiovascular: Negative for chest pain, palpitations and leg swelling.  Gastrointestinal: Negative.   Neurological: Negative.   Psychiatric/Behavioral: Negative.     Per HPI unless specifically indicated above     Objective:    There were no vitals taken for this visit.  Wt Readings from Last 3 Encounters:  01/19/20 195 lb (88.5 kg)  06/19/19 196 lb 3.2 oz (89 kg)  12/09/18 185 lb (83.9 kg)    Physical Exam   Unable to perform due to telephone visit only.  Results for orders placed or performed in visit on 02/14/20  Novel Coronavirus, NAA ([facility])   Specimen: Nasopharyngeal(NP) swabs in vial transport medium   Nasopharynge  Screenin  Result Value Ref Range   SARS-CoV-2, NAA Detected (A) Not  Detected  SARS-COV-2, NAA 2 DAY TAT   Nasopharynge  Screenin  Result Value Ref Range   SARS-CoV-2, NAA 2 DAY TAT Performed       Assessment & Plan:   Problem List Items Addressed This Visit      Respiratory   URI (upper respiratory infection)    Acute and improving at this time,  recommend to continue current at home regimen.  Self quarantine until Covid results have returned and symptoms have improved.  Recommend: - Increased rest - Increasing Fluids - Acetaminophen as needed for fever/pain.  - Salt water gargling, chloraseptic spray and throat lozenges - Mucinex.  - Humidifying the air Follow-up as needed based on symptoms.         I discussed the assessment and treatment plan with the patient. The patient was provided an opportunity to ask questions and all were answered. The patient agreed with the plan and demonstrated an understanding of the instructions.   The patient was advised to call back or seek an in-person evaluation if the symptoms worsen or if the condition fails to improve as anticipated.   I provided 21+ minutes of time during this encounter.  Follow up plan: Return if symptoms worsen or fail to improve.

## 2020-06-11 NOTE — Progress Notes (Signed)
Contacted via [place] covid testing returned negative!!  [employer]!!

## 2020-06-11 NOTE — Patient Instructions (Signed)

## 2020-06-11 NOTE — Assessment & Plan Note (Signed)
Acute and improving at this time, recommend to continue current at home regimen.  Self quarantine until Covid results have returned and symptoms have improved.  Recommend: - Increased rest - Increasing Fluids - Acetaminophen as needed for fever/pain.  - Salt water gargling, chloraseptic spray and throat lozenges - Mucinex.  - Humidifying the air Follow-up as needed based on symptoms.

## 2020-06-18 DIAGNOSIS — J301 Allergic rhinitis due to pollen: Secondary | ICD-10-CM | POA: Diagnosis not present

## 2020-06-24 ENCOUNTER — Ambulatory Visit (INDEPENDENT_AMBULATORY_CARE_PROVIDER_SITE_OTHER): Payer: BC Managed Care – PPO | Admitting: Nurse Practitioner

## 2020-06-24 ENCOUNTER — Other Ambulatory Visit: Payer: Self-pay

## 2020-06-24 ENCOUNTER — Encounter: Payer: Self-pay | Admitting: Nurse Practitioner

## 2020-06-24 VITALS — BP 136/88 | HR 69 | Temp 97.7°F | Ht 73.7 in | Wt 188.2 lb

## 2020-06-24 DIAGNOSIS — Z13220 Encounter for screening for lipoid disorders: Secondary | ICD-10-CM

## 2020-06-24 DIAGNOSIS — Z0000 Encounter for general adult medical examination without abnormal findings: Secondary | ICD-10-CM

## 2020-06-24 DIAGNOSIS — Z125 Encounter for screening for malignant neoplasm of prostate: Secondary | ICD-10-CM

## 2020-06-24 DIAGNOSIS — Z1329 Encounter for screening for other suspected endocrine disorder: Secondary | ICD-10-CM

## 2020-06-24 MED ORDER — AMLODIPINE BESYLATE 5 MG PO TABS: 5.0000 mg | ORAL_TABLET | Freq: Two times a day (BID) | ORAL | 4 refills | Status: DC

## 2020-06-24 NOTE — Progress Notes (Signed)
BP 136/88 (BP Location: Left Arm)   Pulse 69   Temp 97.7 F (36.5 C) (Oral)   Ht 6' 1.7" (1.872 m)   Wt 188 lb 3.2 oz (85.4 kg)   SpO2 97%   BMI 24.36 kg/m    Subjective:    Patient ID: Jonathan Roberson, male    DOB: 1956/04/19, 65 y.o.   MRN: [id number]  HPI: Jonathan Roberson is a 65 y.o. male presenting on 06/24/2020 for comprehensive medical examination. Current medical complaints include:none  He currently lives with: wife Interim Problems from his last visit: no   Depression screen [facility] 2/9 06/24/2020 06/11/2020 01/19/2020 06/19/2019 12/09/2018  Decreased Interest 0 0 1 1 0  Down, Depressed, Hopeless 0 0 1 0 0  PHQ - 2 Score 0 0 2 1 0  Altered sleeping 0 0 0 1 0  Tired, decreased energy 0 0 1 1 1   Change in appetite 0 0 0 0 0  Feeling bad or failure about yourself  0 0 0 0 0  Trouble concentrating 0 0 1 1 0  Moving slowly or fidgety/restless 0 0 0 0 0  Suicidal thoughts 0 0 0 0 0  PHQ-9 Score 0 0 4 4 1   Difficult doing work/chores - - Not difficult at all Not difficult at all Not difficult at all  Some recent data might be hidden    Functional Status Survey: Is the patient deaf or have difficulty hearing?: No Does the patient have difficulty seeing, even when wearing glasses/contacts?: No Does the patient have difficulty concentrating, remembering, or making decisions?: No Does the patient have difficulty walking or climbing stairs?: No Does the patient have difficulty dressing or bathing?: No Does the patient have difficulty doing errands alone such as visiting a doctor's office or shopping?: No  FALL RISK: Fall Risk  06/24/2020 06/11/2020 07/17/2019 06/29/2017  Falls in the past year? 0 0 0 No  Number falls in past yr: - - 0 -  Injury with Fall? - - 0 -  Follow up - - Falls evaluation completed -    Advanced Directives <no information>  Past Medical History:  Past Medical History:  Diagnosis Date  . Allergy   . Allergy to dust   . Arthritis    hands, hips, knees   . Asthma   . Depression   . Hyperlipidemia   . Hypertension     Surgical History:  Past Surgical History:  Procedure Laterality Date  . BIOPSY N/A 08/14/2016   Procedure: BIOPSY;  Surgeon: Lucilla Lame, MD;  Location: [place];  Service: Endoscopy;  Laterality: N/A;  . COLONOSCOPY WITH PROPOFOL N/A 08/14/2016   Procedure: COLONOSCOPY WITH PROPOFOL;  Surgeon: Lucilla Lame, MD;  Location: [place];  Service: Endoscopy;  Laterality: N/A;  . IMAGE GUIDED SINUS SURGERY Bilateral 09/24/2015   Procedure: IMAGE GUIDED SINUS SURGERY Bilateral nasal polypectomy,maxillary antrostomy with tissue removal,bilateral ethmoidectomy,frontal sinusotomy;  Surgeon: Clyde Canterbury, MD;  Location: [place];  Service: ENT;  Laterality: Bilateral;  GAVE DISK TO CE CE 4-12 KP  . NASAL SINUS SURGERY  2005   Dr. Nadeen Landau, [facility]    Medications:  Current Outpatient Medications on File Prior to Visit  Medication Sig  . albuterol (PROVENTIL) (2.5 MG/3ML) 0.083% nebulizer solution USE 1 VIAL (3 ML) (2.5 MG TOTAL) VIA NEBULIZER EVERY 6 HOURS AS NEEDED FOR WHEEZING OR SHORTNESS OF BREATH  . albuterol (VENTOLIN HFA) 108 (90 Base) MCG/ACT inhaler Inhale 2 puffs into  the lungs every 6 (six) hours as needed for wheezing or shortness of breath.  . Ascorbic Acid (VITAMIN C PO) Take by mouth.  . Aspirin Buf,CaCarb-MgCarb-MgO, 81 MG TABS Take by mouth.  Marland Kitchen b complex vitamins tablet Take 1 tablet by mouth daily.  Marland Kitchen FLOVENT HFA 110 MCG/ACT inhaler   . fluticasone (FLONASE) 50 MCG/ACT nasal spray Place 2 sprays into both nostrils daily.  . Glucosamine-Chondroitin (GLUCOSAMINE CHONDR COMPLEX PO) Take by mouth.  . loratadine (CLARITIN) 10 MG tablet Take 10 mg by mouth daily as needed for allergies.  . Multiple Vitamins-Minerals (ZINC PO) Take by mouth.  Marland Kitchen PARoxetine (PAXIL) 20 MG tablet TAKE 1 TABLET DAILY  . VITAMIN D, ERGOCALCIFEROL, PO Take 1 tablet by mouth daily.   No current  facility-administered medications on file prior to visit.    Allergies:  Allergies  Allergen Reactions  . Losartan Shortness Of Breath  . Shrimp [Shellfish Allergy] Shortness Of Breath    When eaten in large amounts  . Monosodium Glutamate     headaches  . Amoxicillin Rash  . Erythromycin Rash  . Penicillins Rash    Social History:  Social History   Socioeconomic History  . Marital status: Married    Spouse name: Not on file  . Number of children: Not on file  . Years of education: Not on file  . Highest education level: Not on file  Occupational History  . Not on file  Tobacco Use  . Smoking status: Never Smoker  . Smokeless tobacco: Never Used  Vaping Use  . Vaping Use: Never used  Substance and Sexual Activity  . Alcohol use: Not Currently    Alcohol/week: 0.0 standard drinks    Comment: on occasion- about once a year  . Drug use: No  . Sexual activity: Yes  Other Topics Concern  . Not on file  Social History Narrative  . Not on file   Social Determinants of Health   Financial Resource Strain: Not on file  Food Insecurity: Not on file  Transportation Needs: Not on file  Physical Activity: Not on file  Stress: Not on file  Social Connections: Not on file  Intimate Partner Violence: Not on file   Social History   Tobacco Use  Smoking Status Never Smoker  Smokeless Tobacco Never Used   Social History   Substance and Sexual Activity  Alcohol Use Not Currently  . Alcohol/week: 0.0 standard drinks   Comment: on occasion- about once a year    Family History:  Family History  Problem Relation Age of Onset  . Heart disease Mother   . Depression Mother   . Arthritis Sister   . Emphysema Father   . Depression Daughter     Past medical history, surgical history, medications, allergies, family history and social history reviewed with patient today and changes made to appropriate areas of the chart.   Review of Systems - negative All other ROS  negative except what is listed above and in the HPI.      Objective:    BP 136/88 (BP Location: Left Arm)   Pulse 69   Temp 97.7 F (36.5 C) (Oral)   Ht 6' 1.7" (1.872 m)   Wt 188 lb 3.2 oz (85.4 kg)   SpO2 97%   BMI 24.36 kg/m   Wt Readings from Last 3 Encounters:  06/24/20 188 lb 3.2 oz (85.4 kg)  01/19/20 195 lb (88.5 kg)  06/19/19 196 lb 3.2 oz (89 kg)  Physical Exam Vitals and nursing note reviewed.  Constitutional:      General: He is awake. He is not in acute distress.    Appearance: He is well-developed. He is not ill-appearing.  HENT:     Head: Normocephalic and atraumatic.     Right Ear: Hearing, tympanic membrane, ear canal and external ear normal. No drainage.     Left Ear: Hearing, tympanic membrane, ear canal and external ear normal. No drainage.     Nose: Nose normal.     Mouth/Throat:     Pharynx: Uvula midline.  Eyes:     General: Lids are normal.        Right eye: No discharge.        Left eye: No discharge.     Extraocular Movements: Extraocular movements intact.     Conjunctiva/sclera: Conjunctivae normal.     Pupils: Pupils are equal, round, and reactive to light.     Visual Fields: Right eye visual fields normal and left eye visual fields normal.  Neck:     Thyroid: No thyromegaly.     Vascular: No carotid bruit or JVD.     Trachea: Trachea normal.  Cardiovascular:     Rate and Rhythm: Normal rate and regular rhythm.     Heart sounds: Normal heart sounds, S1 normal and S2 normal. No murmur heard. No gallop.   Pulmonary:     Effort: Pulmonary effort is normal. No accessory muscle usage or respiratory distress.     Breath sounds: Normal breath sounds.  Abdominal:     General: Bowel sounds are normal.     Palpations: Abdomen is soft. There is no hepatomegaly or splenomegaly.     Tenderness: There is no abdominal tenderness.  Musculoskeletal:        General: Normal range of motion.     Cervical back: Normal range of motion and neck supple.      Right lower leg: No edema.     Left lower leg: No edema.  Lymphadenopathy:     Head:     Right side of head: No submental, submandibular, tonsillar, preauricular or posterior auricular adenopathy.     Left side of head: No submental, submandibular, tonsillar, preauricular or posterior auricular adenopathy.     Cervical: No cervical adenopathy.  Skin:    General: Skin is warm and dry.     Capillary Refill: Capillary refill takes less than 2 seconds.     Findings: No rash.  Neurological:     Mental Status: He is alert and oriented to person, place, and time.     Cranial Nerves: Cranial nerves are intact.     Gait: Gait is intact.     Deep Tendon Reflexes: Reflexes are normal and symmetric.     Reflex Scores:      Brachioradialis reflexes are 2+ on the right side and 2+ on the left side.      Patellar reflexes are 2+ on the right side and 2+ on the left side. Psychiatric:        Attention and Perception: Attention normal.        Mood and Affect: Mood normal.        Speech: Speech normal.        Behavior: Behavior normal. Behavior is cooperative.        Thought Content: Thought content normal.        Cognition and Memory: Cognition normal.        Judgment: Judgment normal.  Results for orders placed or performed in visit on 06/07/20  Novel Coronavirus, NAA ([facility])   Specimen: Nasopharyngeal(NP) swabs in vial transport medium  Result Value Ref Range   SARS-CoV-2, NAA Not Detected Not Detected      Assessment & Plan:   Problem List Items Addressed This Visit   None   Visit Diagnoses    Encounter for annual physical exam    -  Primary   Annual labs today to include CBC, CMP, TSH, lipid   Relevant Orders   CBC with Differential/Platelet   Comprehensive metabolic panel   Prostate cancer screening       PSA on labs today   Relevant Orders   PSA   Thyroid disorder screen       TSH on labs today   Relevant Orders   TSH   Screening cholesterol level       Lipid  panel on labs today   Relevant Orders   Lipid Panel w/o Chol/HDL Ratio       Discussed aspirin prophylaxis for myocardial infarction prevention and decision was made to continue ASA  LABORATORY TESTING:  Health maintenance labs ordered today as discussed above.   The natural history of prostate cancer and ongoing controversy regarding screening and potential treatment outcomes of prostate cancer has been discussed with the patient. The meaning of a false positive PSA and a false negative PSA has been discussed. He indicates understanding of the limitations of this screening test and wishes to proceed with screening PSA testing.   IMMUNIZATIONS:   - Tdap: Tetanus vaccination status reviewed: last tetanus booster within 10 years. - Influenza: Up to date - Pneumovax: Up to date - Prevnar: Not applicable - Zostavax vaccine: Refused  SCREENING: - Colonoscopy: Up to date  Discussed with patient purpose of the colonoscopy is to detect colon cancer at curable precancerous or early stages   - AAA Screening: Not applicable  -Hearing Test: Not applicable  -Spirometry: Not applicable   PATIENT COUNSELING:    Sexuality: Discussed sexually transmitted diseases, partner selection, use of condoms, avoidance of unintended pregnancy  and contraceptive alternatives.   Advised to avoid cigarette smoking.  I discussed with the patient that most people either abstain from alcohol or drink within safe limits (<=14/week and <=4 drinks/occasion for males, <=7/weeks and <= 3 drinks/occasion for females) and that the risk for alcohol disorders and other health effects rises proportionally with the number of drinks per week and how often a drinker exceeds daily limits.  Discussed cessation/primary prevention of drug use and availability of treatment for abuse.   Diet: Encouraged to adjust caloric intake to maintain  or achieve ideal body weight, to reduce intake of dietary saturated fat and total fat, to  limit sodium intake by avoiding high sodium foods and not adding table salt, and to maintain adequate dietary potassium and calcium preferably from fresh fruits, vegetables, and low-fat dairy products.    Stressed the importance of regular exercise  Injury prevention: Discussed safety belts, safety helmets, smoke detector, smoking near bedding or upholstery.   Dental health: Discussed importance of regular tooth brushing, flossing, and dental visits.   Follow up plan: NEXT PREVENTATIVE PHYSICAL DUE IN 1 YEAR. Return in about 6 months (around 12/22/2020) for HTN/HLD, MOOD.

## 2020-06-24 NOTE — Patient Instructions (Signed)

## 2020-06-25 ENCOUNTER — Other Ambulatory Visit: Payer: Self-pay | Admitting: Nurse Practitioner

## 2020-06-25 DIAGNOSIS — R7989 Other specified abnormal findings of blood chemistry: Secondary | ICD-10-CM

## 2020-06-25 DIAGNOSIS — E871 Hypo-osmolality and hyponatremia: Secondary | ICD-10-CM

## 2020-06-25 DIAGNOSIS — R972 Elevated prostate specific antigen [PSA]: Secondary | ICD-10-CM

## 2020-06-25 LAB — COMPREHENSIVE METABOLIC PANEL
ALT: 13 IU/L (ref 0–44)
AST: 18 IU/L (ref 0–40)
Albumin/Globulin Ratio: 1.4 (ref 1.2–2.2)
Albumin: 4.3 g/dL (ref 3.8–4.8)
Alkaline Phosphatase: 58 IU/L (ref 44–121)
BUN/Creatinine Ratio: 15 (ref 10–24)
BUN: 12 mg/dL (ref 8–27)
Bilirubin Total: 0.4 mg/dL (ref 0.0–1.2)
CO2: 25 mmol/L (ref 20–29)
Calcium: 9.2 mg/dL (ref 8.6–10.2)
Chloride: 93 mmol/L — ABNORMAL LOW (ref 96–106)
Creatinine, Ser: 0.82 mg/dL (ref 0.76–1.27)
GFR calc Af Amer: 108 mL/min/{1.73_m2} (ref >59)
GFR calc non Af Amer: 93 mL/min/{1.73_m2} (ref >59)
Globulin, Total: 3 g/dL (ref 1.5–4.5)
Glucose: 97 mg/dL (ref 65–99)
Potassium: 3.7 mmol/L (ref 3.5–5.2)
Sodium: 132 mmol/L — ABNORMAL LOW (ref 134–144)
Total Protein: 7.3 g/dL (ref 6.0–8.5)

## 2020-06-25 LAB — CBC WITH DIFFERENTIAL/PLATELET
Basophils Absolute: 0 10*3/uL (ref 0.0–0.2)
Basos: 1 %
EOS (ABSOLUTE): 0.4 10*3/uL (ref 0.0–0.4)
Eos: 7 %
Hematocrit: 39.1 % (ref 37.5–51.0)
Hemoglobin: 13.2 g/dL (ref 13.0–17.7)
Immature Grans (Abs): 0 10*3/uL (ref 0.0–0.1)
Immature Granulocytes: 0 %
Lymphocytes Absolute: 1.2 10*3/uL (ref 0.7–3.1)
Lymphs: 25 %
MCH: 30.3 pg (ref 26.6–33.0)
MCHC: 33.8 g/dL (ref 31.5–35.7)
MCV: 90 fL (ref 79–97)
Monocytes Absolute: 0.6 10*3/uL (ref 0.1–0.9)
Monocytes: 11 %
Neutrophils Absolute: 2.9 10*3/uL (ref 1.4–7.0)
Neutrophils: 56 %
Platelets: 237 10*3/uL (ref 150–450)
RBC: 4.36 x10E6/uL (ref 4.14–5.80)
RDW: 11.7 % (ref 11.6–15.4)
WBC: 5.1 10*3/uL (ref 3.4–10.8)

## 2020-06-25 LAB — PSA: Prostate Specific Ag, Serum: 4.3 ng/mL — ABNORMAL HIGH (ref 0.0–4.0)

## 2020-06-25 LAB — LIPID PANEL W/O CHOL/HDL RATIO
Cholesterol, Total: 236 mg/dL — ABNORMAL HIGH (ref 100–199)
HDL: 77 mg/dL (ref >39)
LDL Chol Calc (NIH): 140 mg/dL — ABNORMAL HIGH (ref 0–99)
Triglycerides: 110 mg/dL (ref 0–149)
VLDL Cholesterol Cal: 19 mg/dL (ref 5–40)

## 2020-06-25 LAB — TSH: TSH: 0.021 u[IU]/mL — ABNORMAL LOW (ref 0.450–4.500)

## 2020-06-25 NOTE — Progress Notes (Signed)
Contacted via MyChart The 10-year ASCVD risk score [name], et al., 2013) is: 13.3%   Values used to calculate the score:     Age: 65 years     Sex: Male     Is Non-Hispanic African American: No     Diabetic: No     Tobacco smoker: No     Systolic Blood Pressure: 267 mmHg     Is BP treated: Yes     HDL Cholesterol: 77 mg/dL     Total Cholesterol: 236 mg/dL   Good evening Jonathan Roberson, your labs have returned: - Sodium level continues to remain on slightly low side, I would recommend taking in a little salt daily, although not too much to avoid increase BP.  Sometimes SSRI medications, like your Paxil, can place sodium on lower side. - Cholesterol levels are elevated, creeping up a bit more.  This places you with a cardiac risk score of 13.3%, this is risk for stroke of heart attack over next 10 years and it is recommended to consider starting statin at this level.  I would like you to think about this for prevention.  Even if we start at low dose. - TSH, thyroid testing, is on low side at 0.021,  meaning more hyperthyroid. - PSA, prostate labs, is trending up at 4.3.  Although this is normal for your age and ethnicity (0 to 4.5) I would like to recheck this in 4 weeks via an outpatient lab visit.  I would also like to recheck thyroid and sodium at this lab visit.  Please schedule this.  Any questions?  If PSA continues to trend up I would send you to urology and if thyroid is continued on lower side I would get you in with endocrinology. Keep being awesome!!  Thank you for allowing me to participate in your care. Kindest regards, Camyra Vaeth

## 2020-07-02 DIAGNOSIS — J301 Allergic rhinitis due to pollen: Secondary | ICD-10-CM | POA: Diagnosis not present

## 2020-07-09 DIAGNOSIS — J301 Allergic rhinitis due to pollen: Secondary | ICD-10-CM | POA: Diagnosis not present

## 2020-07-16 DIAGNOSIS — J301 Allergic rhinitis due to pollen: Secondary | ICD-10-CM | POA: Diagnosis not present

## 2020-07-23 DIAGNOSIS — J301 Allergic rhinitis due to pollen: Secondary | ICD-10-CM | POA: Diagnosis not present

## 2020-07-30 DIAGNOSIS — J301 Allergic rhinitis due to pollen: Secondary | ICD-10-CM | POA: Diagnosis not present

## 2020-08-06 DIAGNOSIS — J301 Allergic rhinitis due to pollen: Secondary | ICD-10-CM | POA: Diagnosis not present

## 2020-08-13 DIAGNOSIS — J301 Allergic rhinitis due to pollen: Secondary | ICD-10-CM | POA: Diagnosis not present

## 2020-08-20 DIAGNOSIS — J301 Allergic rhinitis due to pollen: Secondary | ICD-10-CM | POA: Diagnosis not present

## 2020-08-21 DIAGNOSIS — J301 Allergic rhinitis due to pollen: Secondary | ICD-10-CM | POA: Diagnosis not present

## 2020-08-27 DIAGNOSIS — J301 Allergic rhinitis due to pollen: Secondary | ICD-10-CM | POA: Diagnosis not present

## 2020-09-03 DIAGNOSIS — J301 Allergic rhinitis due to pollen: Secondary | ICD-10-CM | POA: Diagnosis not present

## 2020-09-17 DIAGNOSIS — J301 Allergic rhinitis due to pollen: Secondary | ICD-10-CM | POA: Diagnosis not present

## 2020-09-24 DIAGNOSIS — J301 Allergic rhinitis due to pollen: Secondary | ICD-10-CM | POA: Diagnosis not present

## 2020-10-01 DIAGNOSIS — J301 Allergic rhinitis due to pollen: Secondary | ICD-10-CM | POA: Diagnosis not present

## 2020-10-08 DIAGNOSIS — J301 Allergic rhinitis due to pollen: Secondary | ICD-10-CM | POA: Diagnosis not present

## 2020-10-15 DIAGNOSIS — J301 Allergic rhinitis due to pollen: Secondary | ICD-10-CM | POA: Diagnosis not present

## 2020-10-22 DIAGNOSIS — J301 Allergic rhinitis due to pollen: Secondary | ICD-10-CM | POA: Diagnosis not present

## 2020-10-29 DIAGNOSIS — J301 Allergic rhinitis due to pollen: Secondary | ICD-10-CM | POA: Diagnosis not present

## 2020-11-05 DIAGNOSIS — J301 Allergic rhinitis due to pollen: Secondary | ICD-10-CM | POA: Diagnosis not present

## 2020-11-12 DIAGNOSIS — J301 Allergic rhinitis due to pollen: Secondary | ICD-10-CM | POA: Diagnosis not present

## 2020-11-19 DIAGNOSIS — J301 Allergic rhinitis due to pollen: Secondary | ICD-10-CM | POA: Diagnosis not present

## 2020-11-26 DIAGNOSIS — J301 Allergic rhinitis due to pollen: Secondary | ICD-10-CM | POA: Diagnosis not present

## 2020-12-03 DIAGNOSIS — J301 Allergic rhinitis due to pollen: Secondary | ICD-10-CM | POA: Diagnosis not present

## 2020-12-10 DIAGNOSIS — J301 Allergic rhinitis due to pollen: Secondary | ICD-10-CM | POA: Diagnosis not present

## 2020-12-17 DIAGNOSIS — J301 Allergic rhinitis due to pollen: Secondary | ICD-10-CM | POA: Diagnosis not present

## 2020-12-23 ENCOUNTER — Ambulatory Visit (INDEPENDENT_AMBULATORY_CARE_PROVIDER_SITE_OTHER): Payer: BC Managed Care – PPO | Admitting: Nurse Practitioner

## 2020-12-23 ENCOUNTER — Other Ambulatory Visit: Payer: Self-pay

## 2020-12-23 ENCOUNTER — Encounter: Payer: Self-pay | Admitting: Nurse Practitioner

## 2020-12-23 VITALS — BP 123/77 | HR 64 | Temp 98.5°F | Wt 188.6 lb

## 2020-12-23 DIAGNOSIS — E7800 Pure hypercholesterolemia, unspecified: Secondary | ICD-10-CM | POA: Diagnosis not present

## 2020-12-23 DIAGNOSIS — R972 Elevated prostate specific antigen [PSA]: Secondary | ICD-10-CM

## 2020-12-23 DIAGNOSIS — E559 Vitamin D deficiency, unspecified: Secondary | ICD-10-CM | POA: Diagnosis not present

## 2020-12-23 DIAGNOSIS — E538 Deficiency of other specified B group vitamins: Secondary | ICD-10-CM | POA: Diagnosis not present

## 2020-12-23 DIAGNOSIS — R5383 Other fatigue: Secondary | ICD-10-CM

## 2020-12-23 DIAGNOSIS — C61 Malignant neoplasm of prostate: Secondary | ICD-10-CM | POA: Insufficient documentation

## 2020-12-23 DIAGNOSIS — I10 Essential (primary) hypertension: Secondary | ICD-10-CM

## 2020-12-23 DIAGNOSIS — F3342 Major depressive disorder, recurrent, in full remission: Secondary | ICD-10-CM

## 2020-12-23 DIAGNOSIS — R7989 Other specified abnormal findings of blood chemistry: Secondary | ICD-10-CM

## 2020-12-23 DIAGNOSIS — E871 Hypo-osmolality and hyponatremia: Secondary | ICD-10-CM | POA: Diagnosis not present

## 2020-12-23 DIAGNOSIS — Z8546 Personal history of malignant neoplasm of prostate: Secondary | ICD-10-CM | POA: Insufficient documentation

## 2020-12-23 MED ORDER — PAROXETINE HCL 20 MG PO TABS: 20.0000 mg | ORAL_TABLET | Freq: Every day | ORAL | 4 refills | Status: DC

## 2020-12-23 NOTE — Assessment & Plan Note (Signed)
Noted recent labs, recheck PSA today.

## 2020-12-23 NOTE — Assessment & Plan Note (Signed)
Chronic, ongoing.  Continue Amlodipine 5 MG BID (patient preference), he tolerated Losartan and Lisinopril poorly.  Recommend he continue this as BP at goal and not on low side.  Continue to monitor BP at home and document. DASH diet at home.  Plan on labs today: CBC, CMP, TSH.  Return in January for physical.

## 2020-12-23 NOTE — Assessment & Plan Note (Signed)
Check Vit D level today and recommend continue daily supplement. 

## 2020-12-23 NOTE — Assessment & Plan Note (Signed)
Noted recent labs -- check TSH and Free T4 today.

## 2020-12-23 NOTE — Assessment & Plan Note (Signed)
Chronic, stable.  Continue current medication regimen and adjust as needed. Denies SI/HI.  Follow-up in 6 months for mood. 

## 2020-12-23 NOTE — Assessment & Plan Note (Signed)
Noted on recent labs, check CMP today and Mag level.  Is on Paxil which could lower NA+.

## 2020-12-23 NOTE — Progress Notes (Signed)
BP 123/77   Pulse 64   Temp 98.5 F (36.9 C) (Oral)   Wt 188 lb 9.6 oz (85.5 kg)   SpO2 94%   BMI 24.41 kg/m    Subjective:    Patient ID: Jonathan Roberson, male    DOB: 1956-02-14, 65 y.o.   MRN: [id number]  HPI: Jonathan Roberson is a 65 y.o. male  Chief Complaint  Patient presents with   Hyperlipidemia   Hypertension    Patient states he thinks he may be over medicated as his blood pressure reading today of "123/77" was extremely low for him and he states he notices when he first takes his blood pressure medication, he notices on some days that his eyes are "weird." Patient states he thinks he may need to set an appointment with eye doctor.    Mood   Fatigue    Patient states he is noticing he is tired more than usual, but once he gets going he is fine. Patient states if he is lying down or in the heat, he is tired.    HYPERTENSION / HYPERLIPIDEMIA Taking Amlodipine 10 MG daily (takes Amlodipine 5 MG at night and 5 MG in morning).  No medications for cholesterol, does not wish to start.  He tried Lisinopril and Losartan in past for BP, but made him feel weird.  Recent annual labs did note lower TSH and elevated PSA. Denies symptoms other then fatigue. Satisfied with current treatment? yes Duration of hypertension: chronic BP monitoring frequency: not checking BP range:  BP medication side effects: no Duration of hyperlipidemia: chronic Cholesterol supplements: fish oil and specific bran muffins that help with cholesterol Aspirin: yes Recent stressors: no Recurrent headaches: no Visual changes: no Palpitations: no Dyspnea: no Chest pain: no Lower extremity edema: occasional from medication The 10-year ASCVD risk score [name], et al., 2013) is: 11.2%   Values used to calculate the score:     Age: 87 years     Sex: Male     Is Non-Hispanic African American: No     Diabetic: No     Tobacco smoker: No     Systolic Blood Pressure: [id number]     Is BP treated: Yes      HDL Cholesterol: 77 mg/dL     Total Cholesterol: 236 mg/dL  DEPRESSION Continues on Paxil 20 MG.  Has history of low levels Vitamin D on labs and is taking supplement.  Does report increased fatigue recently, noted most when he is lying down or in the heat.  Reports this fatigue has been going on for months.  Naps a lot on weekends.   Mood status: stable Satisfied with current treatment?: yes Symptom severity: mild  Duration of current treatment : chronic Side effects: no Medication compliance: good compliance Psychotherapy/counseling: none Depressed mood: occasional Anxious mood: no Anhedonia: no Significant weight loss or gain: no Insomnia:  none Fatigue: no Feelings of worthlessness or guilt: no Impaired concentration/indecisiveness: occasional Suicidal ideations: no Hopelessness: no Crying spells: no Depression screen [facility] 2/9 12/23/2020 06/24/2020 06/11/2020 01/19/2020 06/19/2019  Decreased Interest 0 0 0 1 1  Down, Depressed, Hopeless 0 0 0 1 0  PHQ - 2 Score 0 0 0 2 1  Altered sleeping 0 0 0 0 1  Tired, decreased energy 0 0 0 1 1  Change in appetite 0 0 0 0 0  Feeling bad or failure about yourself  0 0 0 0 0  Trouble concentrating 0 0 0  1 1  Moving slowly or fidgety/restless 0 0 0 0 0  Suicidal thoughts 0 0 0 0 0  PHQ-9 Score 0 0 0 4 4  Difficult doing work/chores Not difficult at all - - Not difficult at all Not difficult at all  Some recent data might be hidden    Relevant past medical, surgical, family and social history reviewed and updated as indicated. Interim medical history since our last visit reviewed. Allergies and medications reviewed and updated.  Review of Systems  Constitutional:  Negative for activity change, diaphoresis, fatigue and fever.  Respiratory:  Negative for cough, chest tightness, shortness of breath and wheezing.   Cardiovascular:  Negative for chest pain, palpitations and leg swelling.  Gastrointestinal: Negative.   Neurological:  Negative.   Psychiatric/Behavioral: Negative.     Per HPI unless specifically indicated above     Objective:    BP 123/77   Pulse 64   Temp 98.5 F (36.9 C) (Oral)   Wt 188 lb 9.6 oz (85.5 kg)   SpO2 94%   BMI 24.41 kg/m   Wt Readings from Last 3 Encounters:  12/23/20 188 lb 9.6 oz (85.5 kg)  06/24/20 188 lb 3.2 oz (85.4 kg)  01/19/20 195 lb (88.5 kg)    Physical Exam Vitals and nursing note reviewed.  Constitutional:      General: He is awake. He is not in acute distress.    Appearance: He is well-developed and well-groomed. He is not ill-appearing.  HENT:     Head: Normocephalic and atraumatic.     Right Ear: Hearing normal. No drainage.     Left Ear: Hearing normal. No drainage.  Eyes:     General: Lids are normal.        Right eye: No discharge.        Left eye: No discharge.     Conjunctiva/sclera: Conjunctivae normal.     Pupils: Pupils are equal, round, and reactive to light.  Neck:     Thyroid: No thyromegaly.     Vascular: No carotid bruit.     Trachea: Trachea normal.  Cardiovascular:     Rate and Rhythm: Normal rate and regular rhythm.     Heart sounds: Normal heart sounds, S1 normal and S2 normal. No murmur heard.   No gallop.  Pulmonary:     Effort: Pulmonary effort is normal. No accessory muscle usage or respiratory distress.     Breath sounds: Normal breath sounds.  Abdominal:     General: Bowel sounds are normal.     Palpations: Abdomen is soft. There is no hepatomegaly or splenomegaly.  Musculoskeletal:        General: Normal range of motion.     Cervical back: Normal range of motion and neck supple.     Right lower leg: No edema.     Left lower leg: No edema.  Skin:    General: Skin is warm and dry.     Capillary Refill: Capillary refill takes less than 2 seconds.  Neurological:     Mental Status: He is alert and oriented to person, place, and time.  Psychiatric:        Mood and Affect: Mood normal.        Speech: Speech normal.         Behavior: Behavior normal. Behavior is cooperative.        Thought Content: Thought content normal.   Results for orders placed or performed in visit on 06/24/20  Comprehensive metabolic  panel  Result Value Ref Range   Glucose 97 65 - 99 mg/dL   BUN 12 8 - 27 mg/dL   Creatinine, Ser 0.82 0.76 - 1.27 mg/dL   GFR calc non Af Amer 93 >59 mL/min/1.73   GFR calc Af Amer 108 >59 mL/min/1.73   BUN/Creatinine Ratio 15 10 - 24   Sodium 132 (L) 134 - 144 mmol/L   Potassium 3.7 3.5 - 5.2 mmol/L   Chloride 93 (L) 96 - 106 mmol/L   CO2 25 20 - 29 mmol/L   Calcium 9.2 8.6 - 10.2 mg/dL   Total Protein 7.3 6.0 - 8.5 g/dL   Albumin 4.3 3.8 - 4.8 g/dL   Globulin, Total 3.0 1.5 - 4.5 g/dL   Albumin/Globulin Ratio 1.4 1.2 - 2.2   Bilirubin Total 0.4 0.0 - 1.2 mg/dL   Alkaline Phosphatase 58 44 - 121 IU/L   AST 18 0 - 40 IU/L   ALT 13 0 - 44 IU/L  Lipid Panel w/o Chol/HDL Ratio  Result Value Ref Range   Cholesterol, Total 236 (H) 100 - 199 mg/dL   Triglycerides 110 0 - 149 mg/dL   HDL 77 >39 mg/dL   VLDL Cholesterol Cal 19 5 - 40 mg/dL   LDL Chol Calc (NIH) 140 (H) 0 - 99 mg/dL  TSH  Result Value Ref Range   TSH 0.021 (L) 0.450 - 4.500 uIU/mL  PSA  Result Value Ref Range   Prostate Specific Ag, Serum 4.3 (H) 0.0 - 4.0 ng/mL  CBC with Differential/Platelet  Result Value Ref Range   WBC 5.1 3.4 - 10.8 x10E3/uL   RBC 4.36 4.14 - 5.80 x10E6/uL   Hemoglobin 13.2 13.0 - 17.7 g/dL   Hematocrit 39.1 37.5 - 51.0 %   MCV 90 79 - 97 fL   MCH 30.3 26.6 - 33.0 pg   MCHC 33.8 31.5 - 35.7 g/dL   RDW 11.7 11.6 - 15.4 %   Platelets 237 150 - 450 x10E3/uL   Neutrophils 56 Not Estab. %   Lymphs 25 Not Estab. %   Monocytes 11 Not Estab. %   Eos 7 Not Estab. %   Basos 1 Not Estab. %   Neutrophils Absolute 2.9 1.4 - 7.0 x10E3/uL   Lymphocytes Absolute 1.2 0.7 - 3.1 x10E3/uL   Monocytes Absolute 0.6 0.1 - 0.9 x10E3/uL   EOS (ABSOLUTE) 0.4 0.0 - 0.4 x10E3/uL   Basophils Absolute 0.0 0.0 - 0.2 x10E3/uL    Immature Granulocytes 0 Not Estab. %   Immature Grans (Abs) 0.0 0.0 - 0.1 x10E3/uL      Assessment & Plan:   Problem List Items Addressed This Visit       Cardiovascular and Mediastinum   Hypertension    Chronic, ongoing.  Continue Amlodipine 5 MG BID (patient preference), he tolerated Losartan and Lisinopril poorly.  Recommend he continue this as BP at goal and not on low side.  Continue to monitor BP at home and document. DASH diet at home.  Plan on labs today: CBC, CMP, TSH.  Return in January for physical.       Relevant Orders   Comprehensive metabolic panel   CBC with Differential/Platelet     Other   Hyperlipidemia    Chronic, ongoing.  He refuses statin at this time and wishes to continue to focus on lifestyle changes.  Discussed risk with current ASCVD score and recommendations for statin.  Will recheck lipid panel today, is not fasting.  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  Continue current medication regimen and adjust as needed. Denies SI/HI.  Follow-up in 6 months for mood.       Relevant Medications   PARoxetine (PAXIL) 20 MG tablet   Vitamin D deficiency    Check Vit D level today and recommend continue daily supplement.       Relevant Orders   VITAMIN D 25 Hydroxy (Vit-D Deficiency, Fractures)   Hyponatremia    Noted on recent labs, check CMP today and Mag level.  Is on Paxil which could lower NA+.       Relevant Orders   Comprehensive metabolic panel   Magnesium   Low serum thyroid stimulating hormone (TSH)    Noted recent labs -- check TSH and Free T4 today.       Relevant Orders   T4, free   TSH   Elevated PSA measurement    Noted recent labs, recheck PSA today.       Relevant Orders   PSA   Other Visit Diagnoses     Fatigue, unspecified type       Check testosterone, CBC, CMP, and B12 today.  Patient is aware that lipid and testosterone  are better checked first thing in morning and may need recheck.   Relevant Orders   Testosterone, free, total([facility])   CBC with Differential/Platelet   B12 deficiency       History of low levels reported, check today.   Relevant Orders   Vitamin B12        Follow up plan: Return in about 6 months (around 06/25/2021) for Annual physical.

## 2020-12-23 NOTE — Patient Instructions (Signed)

## 2020-12-23 NOTE — Assessment & Plan Note (Signed)
Chronic, ongoing.  He refuses statin at this time and wishes to continue to focus on lifestyle changes.  Discussed risk with current ASCVD score and recommendations for statin.  Will recheck lipid panel today, is not fasting. 

## 2020-12-24 ENCOUNTER — Other Ambulatory Visit: Payer: Self-pay | Admitting: Nurse Practitioner

## 2020-12-24 DIAGNOSIS — R7989 Other specified abnormal findings of blood chemistry: Secondary | ICD-10-CM

## 2020-12-24 DIAGNOSIS — J301 Allergic rhinitis due to pollen: Secondary | ICD-10-CM | POA: Diagnosis not present

## 2020-12-24 NOTE — Progress Notes (Signed)
Contacted via [place] -- please call as last time he did not get MyChart messages and missed lab recheck: Good afternoon Jonathan Roberson, most of the labs have returned, waiting on one only.   - TSH, thyroid test, continues to show lower level but is trending up.  I would like to recheck this in 6 weeks via outpatient lab -- please schedule this. If remains lower side I may send to endocrinology. - Cholesterol labs remain elevated, I do recommend starting medication for cholesterol to help lower levels and decrease risk for stroke or heart attack.  If interested in this let me know. - Sodium level remains on lower side at 132, normal 134 to 144.  This could be caused by many things, lots of water intake or even medications like Paxil can lower this.  Recommend adding a smidge of salt to diet daily.  Kidney and liver function are normal. - PSA is in normal range for you age and ethnicity, but on upper side of normal.  I will recheck next visit to ensure this stays stable.  Any questions? Keep being awesome!!  Thank you for allowing me to participate in your care.  I appreciate you. Kindest regards, Taleen Prosser

## 2020-12-25 LAB — CBC WITH DIFFERENTIAL/PLATELET
Basophils Absolute: 0.1 10*3/uL (ref 0.0–0.2)
Basos: 1 %
EOS (ABSOLUTE): 0.4 10*3/uL (ref 0.0–0.4)
Eos: 8 %
Hematocrit: 39.1 % (ref 37.5–51.0)
Hemoglobin: 13.3 g/dL (ref 13.0–17.7)
Immature Grans (Abs): 0 10*3/uL (ref 0.0–0.1)
Immature Granulocytes: 0 %
Lymphocytes Absolute: 1.4 10*3/uL (ref 0.7–3.1)
Lymphs: 29 %
MCH: 31 pg (ref 26.6–33.0)
MCHC: 34 g/dL (ref 31.5–35.7)
MCV: 91 fL (ref 79–97)
Monocytes Absolute: 0.6 10*3/uL (ref 0.1–0.9)
Monocytes: 11 %
Neutrophils Absolute: 2.4 10*3/uL (ref 1.4–7.0)
Neutrophils: 51 %
Platelets: 200 10*3/uL (ref 150–450)
RBC: 4.29 x10E6/uL (ref 4.14–5.80)
RDW: 13.1 % (ref 11.6–15.4)
WBC: 4.8 10*3/uL (ref 3.4–10.8)

## 2020-12-25 LAB — LIPID PANEL W/O CHOL/HDL RATIO
Cholesterol, Total: 213 mg/dL — ABNORMAL HIGH (ref 100–199)
HDL: 70 mg/dL (ref >39)
LDL Chol Calc (NIH): 128 mg/dL — ABNORMAL HIGH (ref 0–99)
Triglycerides: 87 mg/dL (ref 0–149)
VLDL Cholesterol Cal: 15 mg/dL (ref 5–40)

## 2020-12-25 LAB — COMPREHENSIVE METABOLIC PANEL
ALT: 12 IU/L (ref 0–44)
AST: 14 IU/L (ref 0–40)
Albumin/Globulin Ratio: 1.3 (ref 1.2–2.2)
Albumin: 4.2 g/dL (ref 3.8–4.8)
Alkaline Phosphatase: 65 IU/L (ref 44–121)
BUN/Creatinine Ratio: 16 (ref 10–24)
BUN: 13 mg/dL (ref 8–27)
Bilirubin Total: 0.9 mg/dL (ref 0.0–1.2)
CO2: 27 mmol/L (ref 20–29)
Calcium: 9.1 mg/dL (ref 8.6–10.2)
Chloride: 90 mmol/L — ABNORMAL LOW (ref 96–106)
Creatinine, Ser: 0.8 mg/dL (ref 0.76–1.27)
Globulin, Total: 3.2 g/dL (ref 1.5–4.5)
Glucose: 89 mg/dL (ref 65–99)
Potassium: 3.6 mmol/L (ref 3.5–5.2)
Sodium: 132 mmol/L — ABNORMAL LOW (ref 134–144)
Total Protein: 7.4 g/dL (ref 6.0–8.5)
eGFR: 99 mL/min/{1.73_m2} (ref >59)

## 2020-12-25 LAB — TESTOSTERONE, FREE, TOTAL, SHBG
Sex Hormone Binding: 79.7 nmol/L — ABNORMAL HIGH (ref 19.3–76.4)
Testosterone, Free: 8.2 pg/mL (ref 6.6–18.1)
Testosterone: 661 ng/dL (ref 264–916)

## 2020-12-25 LAB — T4, FREE: Free T4: 1.4 ng/dL (ref 0.82–1.77)

## 2020-12-25 LAB — TSH: TSH: 0.173 u[IU]/mL — ABNORMAL LOW (ref 0.450–4.500)

## 2020-12-25 LAB — MAGNESIUM: Magnesium: 2 mg/dL (ref 1.6–2.3)

## 2020-12-25 LAB — VITAMIN B12: Vitamin B-12: 481 pg/mL (ref 232–1245)

## 2020-12-25 LAB — VITAMIN D 25 HYDROXY (VIT D DEFICIENCY, FRACTURES): Vit D, 25-Hydroxy: 37.8 ng/mL (ref 30.0–100.0)

## 2020-12-25 LAB — PSA: Prostate Specific Ag, Serum: 4.3 ng/mL — ABNORMAL HIGH (ref 0.0–4.0)

## 2020-12-30 ENCOUNTER — Telehealth: Payer: Self-pay | Admitting: Nurse Practitioner

## 2020-12-30 NOTE — Telephone Encounter (Signed)
Pt is calling in wanting to have a printed copy of his lab results so that he can come by the office to pick it up.

## 2020-12-30 NOTE — Telephone Encounter (Signed)
Spoke with patient and notified of results, scheduled apt for 6 weeks. Will place results in [place] for patient pick up.

## 2021-01-07 DIAGNOSIS — J301 Allergic rhinitis due to pollen: Secondary | ICD-10-CM | POA: Diagnosis not present

## 2021-01-14 DIAGNOSIS — J301 Allergic rhinitis due to pollen: Secondary | ICD-10-CM | POA: Diagnosis not present

## 2021-01-21 DIAGNOSIS — J301 Allergic rhinitis due to pollen: Secondary | ICD-10-CM | POA: Diagnosis not present

## 2021-02-04 DIAGNOSIS — J301 Allergic rhinitis due to pollen: Secondary | ICD-10-CM | POA: Diagnosis not present

## 2021-02-05 DIAGNOSIS — J301 Allergic rhinitis due to pollen: Secondary | ICD-10-CM | POA: Diagnosis not present

## 2021-02-10 ENCOUNTER — Other Ambulatory Visit: Payer: BC Managed Care – PPO

## 2021-02-10 ENCOUNTER — Other Ambulatory Visit: Payer: Self-pay

## 2021-02-10 ENCOUNTER — Other Ambulatory Visit: Payer: Self-pay | Admitting: Nurse Practitioner

## 2021-02-10 DIAGNOSIS — R972 Elevated prostate specific antigen [PSA]: Secondary | ICD-10-CM

## 2021-02-11 DIAGNOSIS — J301 Allergic rhinitis due to pollen: Secondary | ICD-10-CM | POA: Diagnosis not present

## 2021-02-18 DIAGNOSIS — J301 Allergic rhinitis due to pollen: Secondary | ICD-10-CM | POA: Diagnosis not present

## 2021-02-25 DIAGNOSIS — J301 Allergic rhinitis due to pollen: Secondary | ICD-10-CM | POA: Diagnosis not present

## 2021-03-01 DIAGNOSIS — Z20822 Contact with and (suspected) exposure to covid-19: Secondary | ICD-10-CM | POA: Diagnosis not present

## 2021-03-11 DIAGNOSIS — J301 Allergic rhinitis due to pollen: Secondary | ICD-10-CM | POA: Diagnosis not present

## 2021-03-18 DIAGNOSIS — J301 Allergic rhinitis due to pollen: Secondary | ICD-10-CM | POA: Diagnosis not present

## 2021-04-01 DIAGNOSIS — J301 Allergic rhinitis due to pollen: Secondary | ICD-10-CM | POA: Diagnosis not present

## 2021-04-08 DIAGNOSIS — J301 Allergic rhinitis due to pollen: Secondary | ICD-10-CM | POA: Diagnosis not present

## 2021-04-10 DIAGNOSIS — J4520 Mild intermittent asthma, uncomplicated: Secondary | ICD-10-CM | POA: Diagnosis not present

## 2021-04-10 DIAGNOSIS — J301 Allergic rhinitis due to pollen: Secondary | ICD-10-CM | POA: Diagnosis not present

## 2021-04-15 DIAGNOSIS — J301 Allergic rhinitis due to pollen: Secondary | ICD-10-CM | POA: Diagnosis not present

## 2021-04-29 DIAGNOSIS — J301 Allergic rhinitis due to pollen: Secondary | ICD-10-CM | POA: Diagnosis not present

## 2021-05-06 DIAGNOSIS — J301 Allergic rhinitis due to pollen: Secondary | ICD-10-CM | POA: Diagnosis not present

## 2021-05-20 DIAGNOSIS — J301 Allergic rhinitis due to pollen: Secondary | ICD-10-CM | POA: Diagnosis not present

## 2021-05-27 DIAGNOSIS — J301 Allergic rhinitis due to pollen: Secondary | ICD-10-CM | POA: Diagnosis not present

## 2021-06-03 DIAGNOSIS — J301 Allergic rhinitis due to pollen: Secondary | ICD-10-CM | POA: Diagnosis not present

## 2021-06-10 DIAGNOSIS — J301 Allergic rhinitis due to pollen: Secondary | ICD-10-CM | POA: Diagnosis not present

## 2021-06-22 NOTE — Patient Instructions (Incomplete)

## 2021-06-24 DIAGNOSIS — J301 Allergic rhinitis due to pollen: Secondary | ICD-10-CM | POA: Diagnosis not present

## 2021-06-25 ENCOUNTER — Encounter: Payer: Medicare HMO | Admitting: Nurse Practitioner

## 2021-06-25 DIAGNOSIS — F3342 Major depressive disorder, recurrent, in full remission: Secondary | ICD-10-CM

## 2021-06-25 DIAGNOSIS — I10 Essential (primary) hypertension: Secondary | ICD-10-CM

## 2021-06-25 DIAGNOSIS — R972 Elevated prostate specific antigen [PSA]: Secondary | ICD-10-CM

## 2021-06-25 DIAGNOSIS — E559 Vitamin D deficiency, unspecified: Secondary | ICD-10-CM

## 2021-06-25 DIAGNOSIS — J45909 Unspecified asthma, uncomplicated: Secondary | ICD-10-CM

## 2021-06-25 DIAGNOSIS — J301 Allergic rhinitis due to pollen: Secondary | ICD-10-CM

## 2021-06-25 DIAGNOSIS — E7800 Pure hypercholesterolemia, unspecified: Secondary | ICD-10-CM

## 2021-06-25 DIAGNOSIS — Z0000 Encounter for general adult medical examination without abnormal findings: Secondary | ICD-10-CM

## 2021-06-25 DIAGNOSIS — E871 Hypo-osmolality and hyponatremia: Secondary | ICD-10-CM

## 2021-07-01 DIAGNOSIS — J301 Allergic rhinitis due to pollen: Secondary | ICD-10-CM | POA: Diagnosis not present

## 2021-07-08 DIAGNOSIS — J301 Allergic rhinitis due to pollen: Secondary | ICD-10-CM | POA: Diagnosis not present

## 2021-07-15 DIAGNOSIS — J301 Allergic rhinitis due to pollen: Secondary | ICD-10-CM | POA: Diagnosis not present

## 2021-07-22 DIAGNOSIS — J301 Allergic rhinitis due to pollen: Secondary | ICD-10-CM | POA: Diagnosis not present

## 2021-07-29 DIAGNOSIS — J301 Allergic rhinitis due to pollen: Secondary | ICD-10-CM | POA: Diagnosis not present

## 2021-08-05 DIAGNOSIS — J301 Allergic rhinitis due to pollen: Secondary | ICD-10-CM | POA: Diagnosis not present

## 2021-08-20 ENCOUNTER — Ambulatory Visit (INDEPENDENT_AMBULATORY_CARE_PROVIDER_SITE_OTHER): Payer: Medicare HMO | Admitting: Internal Medicine

## 2021-08-20 ENCOUNTER — Ambulatory Visit: Payer: Self-pay | Admitting: *Deleted

## 2021-08-20 ENCOUNTER — Encounter: Payer: Self-pay | Admitting: Internal Medicine

## 2021-08-20 ENCOUNTER — Other Ambulatory Visit: Payer: Self-pay

## 2021-08-20 VITALS — BP 100/65 | HR 101 | Temp 98.9°F | Ht 73.7 in | Wt 171.6 lb

## 2021-08-20 DIAGNOSIS — J4540 Moderate persistent asthma, uncomplicated: Secondary | ICD-10-CM | POA: Diagnosis not present

## 2021-08-20 MED ORDER — CHERATUSSIN AC 100-10 MG/5ML PO SOLN: 5.0000 mL | Freq: Every day | ORAL | 0 refills | Status: DC

## 2021-08-20 MED ORDER — BENZONATATE 100 MG PO CAPS: 100.0000 mg | ORAL_CAPSULE | Freq: Two times a day (BID) | ORAL | 0 refills | Status: DC | PRN

## 2021-08-20 MED ORDER — FLUTICASONE-SALMETEROL 100-50 MCG/ACT IN AEPB: 1.0000 | INHALATION_SPRAY | Freq: Two times a day (BID) | RESPIRATORY_TRACT | 3 refills | Status: DC

## 2021-08-20 MED ORDER — MONTELUKAST SODIUM 10 MG PO TABS: 10.0000 mg | ORAL_TABLET | Freq: Every day | ORAL | 1 refills | Status: DC

## 2021-08-20 NOTE — Telephone Encounter (Signed)
?  Chief Complaint: Having congestion in the mornings since weather change.   Has asthma requesting to come into office and get a breathing treatment.   He has done this in the past. ?Symptoms: morning congestion with weather change ?Frequency: Mornings ?Pertinent Negatives: Patient denies wheezing or distress currently. ?Disposition: '[]'$ ED /'[]'$ Urgent Care (no appt availability in office) / '[x]'$ Appointment(In office/virtual)/ '[]'$  [facility]/ '[]'$ Home Care/ '[]'$ Refused Recommended Disposition /'[]'$ [facility]/ '[]'$  Follow-up with PCP ?Additional Notes: Appt made for today by Ubaldo Glassing in the office for today at 4:00 with Dr. Neomia Dear as he needs to see a provider in order to have a breathing treatment administered in the office.  ?

## 2021-08-20 NOTE — Telephone Encounter (Signed)
Reason for Disposition ? [1] Asthma symptoms present > 24 hours AND [2] asthma medicine is needed more frequently than q 4 hours (Exception: q 3 hours and has been recommended by PCP) ?   Coming into office for a breathing treatment today.  Seeing Dr. Neomia Dear at 4:00 ? ?Answer Assessment - Initial Assessment Questions ?1. SEVERITY: "How bad is this attack? Describe your child's breathing. What does it sound like?" ?* MILD: no SOB at rest, mild SOB with walking, speaks normally in sentences, can lay down flat,  no retractions, wheezes only heard by stethoscope (GREEN Zone: PEFR 80-100%)  ?* MODERATE: SOB at rest, speaks in phrases, prefers to sit (can't lay down flat), mild retractions, audible wheezing (YELLOW Zone: PEFR 50-80%) ?* SEVERE: severe SOB at rest, speaks in single words (struggling to breathe), severe retractions, usually loud wheezing or sometimes minimal wheezing because of decreased air movement (RED Zone: PEFR < 50%)  ?* MODERATE and SEVERE asthma attacks also interfere with normal activities and sleep (Reason: too hypoxic to sleep). SEVERE hypoxia can also cause confusion or altered mental status.  ?    Pt calling in requesting to come in and get a breathing treatment in the office.   They've done this for him in the past. ?He is having morning congestion. ?2. PEAK EXPIRATORY FLOW RATE (PEFR): "Do you use a peak flow meter?" If so, ask: "What's the current peak flow? What's your child's normal peak flow?" (AGE 57 years or older). ?    Not asked ?3. ONSET: "When did this asthma attack start?"  ?    Having morning congestion since the weather has changed. ?4. TRIGGER: "What do you think triggered this attack?" (e.g. URI, exposure to pollen or other allergen, tobacco smoke)  ?    Pollen and weather ?5. INHALED RESCUE MEDS (inhaler or nebs): "What is your child's asthma rescue medicine?" Note: The neb or inhaler rescue treatments listed in the triage questions refers to quick-relief SINGLE medicines such as  albuterol, xopenex or salbutamol ([place]). CAUTION:  If patient is using a COMBINATION medicine (Symbicort, Dulera, Advair, Breo, AirDuo Respiclick) as a rescue medicine consult PCP for dosing more frequent than every 4 hours.  ?    *No Answer* ?6. INHALED STEROID: "Does your child also take an inhaled steroid (e.g., Pulmicort, Flovent, Qvar, etc )?" Controller or maintenance asthma medicines refer to anti-inflammatory medicines such as inhaled steroids or oral singulair. They are not helpful at reversing acute asthma attacks. However, controller medicines should be continued during the attack. ?    *No Answer* ?7. INHALED TREATMENTS GIVEN: "What treatments have you given so far?" and "How often?" If using an inhaler, ask, "How many puffs?" Recommended SINGLE medicine rescue Inhaler Dosage: Routine treatments are 2 puffs every 4 hours as needed.  Rescue treatments are 4 puffs. NOTE: A routine "treatment" is defined as 1 neb treatment OR 2 or more puffs of SINGLE medicine rescue inhaler.  Back-to-back treatments are considered 2 or more SINGLE medicine treatments within a 2 hour period. CAUTION:  If patient is using a COMBINATION medicine (Symbicort, Dulera, Advair, Breo, AirDuo Respiclick) as a rescue medicine consult PCP for dosing more frequent than every 4 hours.  ?    *No Answer* ?8. INHALER: "How long have you had this inhaler?" "Could it be empty?"  ?    *No Answer* ?9. SPACER: "Do you have a spacer?" If yes, "Are you using it?" ?    *No Answer* ? ?Note to Triager -  Respiratory Distress: Always rule out respiratory distress (also known as working hard to breathe or shortness of breath). Listen for grunting, stridor, wheezing, tachypnea in these calls. How to assess: Listen to the child's breathing early in your assessment. Reason: What you hear is often more valid than the caller's answers to your triage questions. ? ?Protocols used: Asthma-P-AH ? ?

## 2021-08-20 NOTE — Progress Notes (Signed)
? ?BP 100/65   Pulse (!) 101   Temp 98.9 ?F (37.2 ?C) (Oral)   Ht 6' 1.7" (1.872 m)   Wt 171 lb 9.6 oz (77.8 kg)   SpO2 94%   BMI 22.21 kg/m?   ? ?Subjective:  ? ? Patient ID: Jonathan Roberson, male    DOB: Aug 22, 1955, 66 y.o.   MRN: [id number] ? ?Chief Complaint  ?Patient presents with  ?? Cough  ?  Last week ?Patient states that he needs a breathing treatment  ? ? ?HPI: ?Jonathan Roberson is a 66 y.o. male ? ?Cough ?This is a new (covid -ve, was sick x 1 week ago, has a ho asthma when he feels sick he feels he gets bronchitis, wakes up in the morning and is rattly and gets coughing fits. uses albuteorl) problem. Episode onset: has been using advair. The cough is Non-productive. Associated symptoms include wheezing. Pertinent negatives include no chest pain, chills, ear congestion, ear pain, fever, heartburn, hemoptysis, myalgias, nasal congestion, postnasal drip, rash, rhinorrhea, sore throat, shortness of breath, sweats or weight loss.  ? ?Chief Complaint  ?Patient presents with  ?? Cough  ?  Last week ?Patient states that he needs a breathing treatment  ? ? ?Relevant past medical, surgical, family and social history reviewed and updated as indicated. Interim medical history since our last visit reviewed. ?Allergies and medications reviewed and updated. ? ?Review of Systems  ?Constitutional:  Negative for chills, fever and weight loss.  ?HENT:  Negative for ear pain, postnasal drip, rhinorrhea and sore throat.   ?Respiratory:  Positive for cough and wheezing. Negative for hemoptysis and shortness of breath.   ?Cardiovascular:  Negative for chest pain.  ?Gastrointestinal:  Negative for heartburn.  ?Musculoskeletal:  Negative for myalgias.  ?Skin:  Negative for rash.  ? ?Per HPI unless specifically indicated above ? ?   ?Objective:  ?  ?BP 100/65   Pulse (!) 101   Temp 98.9 ?F (37.2 ?C) (Oral)   Ht 6' 1.7" (1.872 m)   Wt 171 lb 9.6 oz (77.8 kg)   SpO2 94%   BMI 22.21 kg/m?   ?Wt Readings from Last 3  Encounters:  ?08/20/21 171 lb 9.6 oz (77.8 kg)  ?12/23/20 188 lb 9.6 oz (85.5 kg)  ?06/24/20 188 lb 3.2 oz (85.4 kg)  ?  ?Physical Exam ?Vitals and nursing note reviewed.  ?Constitutional:   ?   General: He is not in acute distress. ?   Appearance: Normal appearance. He is not ill-appearing or diaphoretic.  ?HENT:  ?   Head: Normocephalic and atraumatic.  ?   Right Ear: Tympanic membrane and external ear normal. There is no impacted cerumen.  ?   Left Ear: External ear normal.  ?   Nose: No congestion or rhinorrhea.  ?   Mouth/Throat:  ?   Pharynx: No oropharyngeal exudate or posterior oropharyngeal erythema.  ?Eyes:  ?   Conjunctiva/sclera: Conjunctivae normal.  ?   Pupils: Pupils are equal, round, and reactive to light.  ?Cardiovascular:  ?   Rate and Rhythm: Normal rate and regular rhythm.  ?   Heart sounds: No murmur heard. ?  No friction rub. No gallop.  ?Pulmonary:  ?   Effort: No respiratory distress.  ?   Breath sounds: No stridor. Wheezing present. No rhonchi.  ?Chest:  ?   Chest wall: No tenderness.  ?Musculoskeletal:  ?   Cervical back: Normal range of motion and neck supple. No rigidity or tenderness.  ?  Left lower leg: No edema.  ?Skin: ?   General: Skin is warm and dry.  ?Neurological:  ?   Mental Status: He is alert.  ? ?Results for orders placed or performed in visit on 12/23/20  ?T4, free  ?Result Value Ref Range  ? Free T4 1.40 0.82 - 1.77 ng/dL  ?TSH  ?Result Value Ref Range  ? TSH 0.173 (L) 0.450 - 4.500 uIU/mL  ?Lipid Panel w/o Chol/HDL Ratio  ?Result Value Ref Range  ? Cholesterol, Total 213 (H) 100 - 199 mg/dL  ? Triglycerides 87 0 - 149 mg/dL  ? HDL 70 >39 mg/dL  ? VLDL Cholesterol Cal 15 5 - 40 mg/dL  ? LDL Chol Calc (NIH) 128 (H) 0 - 99 mg/dL  ?Comprehensive metabolic panel  ?Result Value Ref Range  ? Glucose 89 65 - 99 mg/dL  ? BUN 13 8 - 27 mg/dL  ? Creatinine, Ser 0.80 0.76 - 1.27 mg/dL  ? eGFR 99 >59 mL/min/1.73  ? BUN/Creatinine Ratio 16 10 - 24  ? Sodium 132 (L) 134 - 144 mmol/L  ?  Potassium 3.6 3.5 - 5.2 mmol/L  ? Chloride 90 (L) 96 - 106 mmol/L  ? CO2 27 20 - 29 mmol/L  ? Calcium 9.1 8.6 - 10.2 mg/dL  ? Total Protein 7.4 6.0 - 8.5 g/dL  ? Albumin 4.2 3.8 - 4.8 g/dL  ? Globulin, Total 3.2 1.5 - 4.5 g/dL  ? Albumin/Globulin Ratio 1.3 1.2 - 2.2  ? Bilirubin Total 0.9 0.0 - 1.2 mg/dL  ? Alkaline Phosphatase 65 44 - 121 IU/L  ? AST 14 0 - 40 IU/L  ? ALT 12 0 - 44 IU/L  ?PSA  ?Result Value Ref Range  ? Prostate Specific Ag, Serum 4.3 (H) 0.0 - 4.0 ng/mL  ?Magnesium  ?Result Value Ref Range  ? Magnesium 2.0 1.6 - 2.3 mg/dL  ?Testosterone, free, total([facility])  ?Result Value Ref Range  ? Testosterone 661 264 - 916 ng/dL  ? Testosterone, Free 8.2 6.6 - 18.1 pg/mL  ? Sex Hormone Binding 79.7 (H) 19.3 - 76.4 nmol/L  ?VITAMIN D 25 Hydroxy (Vit-D Deficiency, Fractures)  ?Result Value Ref Range  ? Vit D, 25-Hydroxy 37.8 30.0 - 100.0 ng/mL  ?Vitamin B12  ?Result Value Ref Range  ? Vitamin B-12 481 232 - 1,245 pg/mL  ?CBC with Differential/Platelet  ?Result Value Ref Range  ? WBC 4.8 3.4 - 10.8 x10E3/uL  ? RBC 4.29 4.14 - 5.80 x10E6/uL  ? Hemoglobin 13.3 13.0 - 17.7 g/dL  ? Hematocrit 39.1 37.5 - 51.0 %  ? MCV 91 79 - 97 fL  ? MCH 31.0 26.6 - 33.0 pg  ? MCHC 34.0 31.5 - 35.7 g/dL  ? RDW 13.1 11.6 - 15.4 %  ? Platelets 200 150 - 450 x10E3/uL  ? Neutrophils 51 Not Estab. %  ? Lymphs 29 Not Estab. %  ? Monocytes 11 Not Estab. %  ? Eos 8 Not Estab. %  ? Basos 1 Not Estab. %  ? Neutrophils Absolute 2.4 1.4 - 7.0 x10E3/uL  ? Lymphocytes Absolute 1.4 0.7 - 3.1 x10E3/uL  ? Monocytes Absolute 0.6 0.1 - 0.9 x10E3/uL  ? EOS (ABSOLUTE) 0.4 0.0 - 0.4 x10E3/uL  ? Basophils Absolute 0.1 0.0 - 0.2 x10E3/uL  ? Immature Granulocytes 0 Not Estab. %  ? Immature Grans (Abs) 0.0 0.0 - 0.1 x10E3/uL  ? ?   ? ? ?Current Outpatient Medications:  ??  albuterol (PROVENTIL) (2.5 MG/3ML) 0.083% nebulizer solution, USE 1  VIAL (3 ML) (2.5 MG TOTAL) VIA NEBULIZER EVERY 6 HOURS AS NEEDED FOR WHEEZING OR SHORTNESS OF BREATH, Disp:  150 mL, Rfl: 20 ??  albuterol (VENTOLIN HFA) 108 (90 Base) MCG/ACT inhaler, Inhale 2 puffs into the lungs every 6 (six) hours as needed for wheezing or shortness of breath., Disp: 18 g, Rfl: 3 ??  amLODipine (NORVASC) 5 MG tablet, Take 1 tablet (5 mg total) by mouth in the morning and at bedtime., Disp: 180 tablet, Rfl: 4 ??  Ascorbic Acid (VITAMIN C PO), Take by mouth., Disp: , Rfl:  ??  Aspirin Buf,CaCarb-MgCarb-MgO, 81 MG TABS, Take by mouth., Disp: , Rfl:  ??  b complex vitamins tablet, Take 1 tablet by mouth daily., Disp: , Rfl:  ??  benzonatate (TESSALON) 100 MG capsule, Take 1 capsule (100 mg total) by mouth 2 (two) times daily as needed for cough., Disp: 20 capsule, Rfl: 0 ??  fluticasone (FLONASE) 50 MCG/ACT nasal spray, Place 2 sprays into both nostrils daily., Disp: 48 g, Rfl: 12 ??  fluticasone-salmeterol (ADVAIR) 100-50 MCG/ACT AEPB, Inhale 1 puff into the lungs 2 (two) times daily., Disp: 1 each, Rfl: 3 ??  Glucosamine-Chondroitin (GLUCOSAMINE CHONDR COMPLEX PO), Take by mouth., Disp: , Rfl:  ??  guaiFENesin-codeine (CHERATUSSIN AC) 100-10 MG/5ML syrup, Take 5 mLs by mouth at bedtime., Disp: 120 mL, Rfl: 0 ??  loratadine (CLARITIN) 10 MG tablet, Take 10 mg by mouth daily as needed for allergies., Disp: , Rfl:  ??  montelukast (SINGULAIR) 10 MG tablet, Take 1 tablet (10 mg total) by mouth at bedtime., Disp: 30 tablet, Rfl: 1 ??  PARoxetine (PAXIL) 20 MG tablet, Take 1 tablet (20 mg total) by mouth daily., Disp: 90 tablet, Rfl: 4 ??  VITAMIN D, ERGOCALCIFEROL, PO, Take 1 tablet by mouth daily., Disp: , Rfl:  ??  Multiple Vitamins-Minerals (ZINC PO), Take by mouth. (Patient not taking: Reported on 08/20/2021), Disp: , Rfl:   ? ? ?Assessment & Plan:  ? ? ?Acute exacerbation of COPD ?DuoNebs given x1.  Solu-Medrol 120 mg administered IM today. ?Will start patient on Spiriva inhaled once a day for presumed COPD as well as albuterol as rescue inhaler. ?Patient does states she has used this in the past as her  mom had COPD and lung cancer. ?Will refer to pulmonology for PFTs. ? ?  ? ?No orders of the defined types were placed in this encounter. ?  ? ?Meds ordered this encounter  ?Medications  ?? fluticasone-salmeterol (ADVAIR) 10

## 2021-08-26 DIAGNOSIS — J301 Allergic rhinitis due to pollen: Secondary | ICD-10-CM | POA: Diagnosis not present

## 2021-08-30 NOTE — Patient Instructions (Incomplete)
Asthma, Adult ?Asthma is a long-term (chronic) condition in which the airways get tight and narrow. The airways are the breathing passages that lead from the nose and mouth down into the lungs. A person with asthma will have times when symptoms get worse. These are called asthma attacks. They can cause coughing, whistling sounds when you breathe (wheezing), shortness of breath, and chest pain. They can make it hard to breathe. There is no cure for asthma, but medicines and lifestyle changes can help control it. ?There are many things that can bring on an asthma attack or make asthma symptoms worse (triggers). Common triggers include: ?Mold. ?Dust. ?Cigarette smoke. ?Cockroaches. ?Things that can cause allergy symptoms (allergens). These include animal skin flakes (dander) and pollen from trees or grass. ?Things that pollute the air. These may include household cleaners, wood smoke, smog, or chemical odors. ?Cold air, weather changes, and wind. ?Crying or laughing hard. ?Stress. ?Certain medicines or drugs. ?Certain foods such as dried fruit, potato chips, and grape juice. ?Infections, such as a cold or the flu. ?Certain medical conditions or diseases. ?Exercise or tiring activities. ?Asthma may be treated with medicines and by staying away from the things that cause asthma attacks. Types of medicines may include: ?Controller medicines. These help prevent asthma symptoms. They are usually taken every day. ?Fast-acting reliever or rescue medicines. These quickly relieve asthma symptoms. They are used as needed and provide short-term relief. ?Allergy medicines if your attacks are brought on by allergens. ?Medicines to help control the body's defense (immune) system. ?Follow these instructions at home: ?Avoiding triggers in your home ?Change your heating and air conditioning filter often. ?Limit your use of fireplaces and wood stoves. ?Get rid of pests (such as roaches and mice) and their droppings. ?Throw away plants  if you see mold on them. ?Clean your floors. Dust regularly. Use cleaning products that do not smell. ?Have someone vacuum when you are not home. Use a vacuum cleaner with a HEPA filter if possible. ?Replace carpet with wood, tile, or vinyl flooring. Carpet can trap animal skin flakes and dust. ?Use allergy-proof pillows, mattress covers, and box spring covers. ?Wash bed sheets and blankets every week in hot water. Dry them in a dryer. ?Keep your bedroom free of any triggers. ?Avoid pets and keep windows closed when things that cause allergy symptoms are in the air. ?Use blankets that are made of polyester or cotton. ?Clean bathrooms and kitchens with bleach. If possible, have someone repaint the walls in these rooms with mold-resistant paint. Keep out of the rooms that are being cleaned and painted. ?Wash your hands often with soap and water. If soap and water are not available, use hand sanitizer. ?Do not allow anyone to smoke in your home. ?General instructions ?Take over-the-counter and prescription medicines only as told by your doctor. ?Talk with your doctor if you have questions about how or when to take your medicines. ?Make note if you need to use your medicines more often than usual. ?Do not use any products that contain nicotine or tobacco, such as cigarettes and e-cigarettes. If you need help quitting, ask your doctor. ?Stay away from secondhand smoke. ?Avoid doing things outdoors when allergen counts are high and when air quality is low. ?Wear a ski mask when doing outdoor activities in the winter. The mask should cover your nose and mouth. Exercise indoors on cold days if you can. ?Warm up before you exercise. Take time to cool down after exercise. ?Use a peak flow meter as   told by your doctor. A peak flow meter is a tool that measures how well the lungs are working. ?Keep track of the peak flow meter's readings. Write them down. ?Follow your asthma action plan. This is a written plan for taking care  of your asthma and treating your attacks. ?Make sure you get all the shots (vaccines) that your doctor recommends. Ask your doctor about a flu shot and a pneumonia shot. ?Keep all follow-up visits as told by your doctor. This is important. ?Contact a doctor if: ?You have wheezing, shortness of breath, or a cough even while taking medicine to prevent attacks. ?The mucus you cough up (sputum) is thicker than usual. ?The mucus you cough up changes from clear or white to yellow, green, gray, or bloody. ?You have problems from the medicine you are taking, such as: ?A rash. ?Itching. ?Swelling. ?Trouble breathing. ?You need reliever medicines more than 2-3 times a week. ?Your peak flow reading is still at 50-79% of your personal best after following the action plan for 1 hour. ?You have a fever. ?Get help right away if: ?You seem to be worse and are not responding to medicine during an asthma attack. ?You are short of breath even at rest. ?You get short of breath when doing very little activity. ?You have trouble eating, drinking, or talking. ?You have chest pain or tightness. ?You have a fast heartbeat. ?Your lips or fingernails start to turn blue. ?You are light-headed or dizzy, or you faint. ?Your peak flow is less than 50% of your personal best. ?You feel too tired to breathe normally. ?Summary ?Asthma is a long-term (chronic) condition in which the airways get tight and narrow. An asthma attack can make it hard to breathe. ?Asthma cannot be cured, but medicines and lifestyle changes can help control it. ?Make sure you understand how to avoid triggers and how and when to use your medicines. ?This information is not intended to replace advice given to you by your health care provider. Make sure you discuss any questions you have with your health care provider. ?Document Revised: 09/10/2019 Document Reviewed: 09/20/2019 ?Elsevier Patient Education ? 2022 [employer] ? ?

## 2021-09-01 ENCOUNTER — Ambulatory Visit: Payer: Medicare HMO | Admitting: Nurse Practitioner

## 2021-09-01 DIAGNOSIS — E871 Hypo-osmolality and hyponatremia: Secondary | ICD-10-CM

## 2021-09-01 DIAGNOSIS — E0590 Thyrotoxicosis, unspecified without thyrotoxic crisis or storm: Secondary | ICD-10-CM

## 2021-09-01 DIAGNOSIS — R972 Elevated prostate specific antigen [PSA]: Secondary | ICD-10-CM

## 2021-09-01 DIAGNOSIS — J4540 Moderate persistent asthma, uncomplicated: Secondary | ICD-10-CM

## 2021-09-02 DIAGNOSIS — J301 Allergic rhinitis due to pollen: Secondary | ICD-10-CM | POA: Diagnosis not present

## 2021-09-09 DIAGNOSIS — J301 Allergic rhinitis due to pollen: Secondary | ICD-10-CM | POA: Diagnosis not present

## 2021-09-16 DIAGNOSIS — J301 Allergic rhinitis due to pollen: Secondary | ICD-10-CM | POA: Diagnosis not present

## 2021-09-23 DIAGNOSIS — J301 Allergic rhinitis due to pollen: Secondary | ICD-10-CM | POA: Diagnosis not present

## 2021-10-07 DIAGNOSIS — J301 Allergic rhinitis due to pollen: Secondary | ICD-10-CM | POA: Diagnosis not present

## 2021-10-13 DIAGNOSIS — J301 Allergic rhinitis due to pollen: Secondary | ICD-10-CM | POA: Diagnosis not present

## 2021-10-14 DIAGNOSIS — J301 Allergic rhinitis due to pollen: Secondary | ICD-10-CM | POA: Diagnosis not present

## 2021-10-21 DIAGNOSIS — J301 Allergic rhinitis due to pollen: Secondary | ICD-10-CM | POA: Diagnosis not present

## 2021-11-04 DIAGNOSIS — J301 Allergic rhinitis due to pollen: Secondary | ICD-10-CM | POA: Diagnosis not present

## 2021-11-07 DIAGNOSIS — H43813 Vitreous degeneration, bilateral: Secondary | ICD-10-CM | POA: Diagnosis not present

## 2021-11-10 DIAGNOSIS — M5412 Radiculopathy, cervical region: Secondary | ICD-10-CM | POA: Diagnosis not present

## 2021-11-11 DIAGNOSIS — J301 Allergic rhinitis due to pollen: Secondary | ICD-10-CM | POA: Diagnosis not present

## 2021-11-18 DIAGNOSIS — J301 Allergic rhinitis due to pollen: Secondary | ICD-10-CM | POA: Diagnosis not present

## 2021-11-20 DIAGNOSIS — M5412 Radiculopathy, cervical region: Secondary | ICD-10-CM | POA: Diagnosis not present

## 2021-11-25 DIAGNOSIS — J301 Allergic rhinitis due to pollen: Secondary | ICD-10-CM | POA: Diagnosis not present

## 2021-11-27 DIAGNOSIS — M5412 Radiculopathy, cervical region: Secondary | ICD-10-CM | POA: Diagnosis not present

## 2021-12-04 DIAGNOSIS — M5412 Radiculopathy, cervical region: Secondary | ICD-10-CM | POA: Diagnosis not present

## 2021-12-11 DIAGNOSIS — M5412 Radiculopathy, cervical region: Secondary | ICD-10-CM | POA: Diagnosis not present

## 2021-12-16 DIAGNOSIS — J301 Allergic rhinitis due to pollen: Secondary | ICD-10-CM | POA: Diagnosis not present

## 2021-12-23 DIAGNOSIS — J301 Allergic rhinitis due to pollen: Secondary | ICD-10-CM | POA: Diagnosis not present

## 2021-12-30 DIAGNOSIS — J301 Allergic rhinitis due to pollen: Secondary | ICD-10-CM | POA: Diagnosis not present

## 2022-01-02 DIAGNOSIS — J301 Allergic rhinitis due to pollen: Secondary | ICD-10-CM | POA: Diagnosis not present

## 2022-01-06 DIAGNOSIS — J301 Allergic rhinitis due to pollen: Secondary | ICD-10-CM | POA: Diagnosis not present

## 2022-01-13 DIAGNOSIS — J301 Allergic rhinitis due to pollen: Secondary | ICD-10-CM | POA: Diagnosis not present

## 2022-01-20 DIAGNOSIS — J301 Allergic rhinitis due to pollen: Secondary | ICD-10-CM | POA: Diagnosis not present

## 2022-01-26 ENCOUNTER — Other Ambulatory Visit: Payer: Self-pay | Admitting: Nurse Practitioner

## 2022-01-26 NOTE — Telephone Encounter (Signed)
Medication Refill - Medication: amLODipine (NORVASC) 5 MG tablet [[id number]]   PARoxetine (PAXIL) 20 MG tablet [[id number]]   fluticasone (FLONASE) 50 MCG/ACT nasal spray [[id number]]    Has the patient contacted their pharmacy? No. (Agent: If no, request that the patient contact the pharmacy for the refill. If patient does not wish to contact the pharmacy document the reason why and proceed with request.) (Agent: If yes, when and what did the pharmacy advise?)  Preferred Pharmacy (with phone number or street name): [facility], [place] [phone] Has the patient been seen for an appointment in the last year OR does the patient have an upcoming appointment? Yes.  3/23  Agent: Please be advised that RX refills may take up to 3 business days. We ask that you follow-up with your pharmacy.

## 2022-01-27 DIAGNOSIS — J301 Allergic rhinitis due to pollen: Secondary | ICD-10-CM | POA: Diagnosis not present

## 2022-01-27 MED ORDER — PAROXETINE HCL 20 MG PO TABS: 20.0000 mg | ORAL_TABLET | Freq: Every day | ORAL | 0 refills | Status: DC

## 2022-01-27 MED ORDER — AMLODIPINE BESYLATE 5 MG PO TABS: 5.0000 mg | ORAL_TABLET | Freq: Two times a day (BID) | ORAL | 0 refills | Status: DC

## 2022-01-27 MED ORDER — FLUTICASONE PROPIONATE 50 MCG/ACT NA SUSP: 2.0000 | Freq: Every day | NASAL | 0 refills | Status: DC

## 2022-01-27 NOTE — Telephone Encounter (Signed)
Scheduled appt for tomorrow, pt wants pharm changed to [facility].

## 2022-01-27 NOTE — Telephone Encounter (Signed)
Made appt for  tomorrow, 3 month supply given. Requested Prescriptions  Pending Prescriptions Disp Refills  . amLODipine (NORVASC) 5 MG tablet 180 tablet 0    Sig: Take 1 tablet (5 mg total) by mouth in the morning and at bedtime.     Cardiovascular: Calcium Channel Blockers 2 Passed - 01/26/2022  2:22 PM      Passed - Last BP in normal range    BP Readings from Last 1 Encounters:  08/20/21 100/65         Passed - Last Heart Rate in normal range    Pulse Readings from Last 1 Encounters:  08/20/21 (!) 101         Passed - Valid encounter within last 6 months    Recent Outpatient Visits          5 months ago Moderate persistent asthma without complication   [name], [name], MD   1 year ago Recurrent major depressive disorder, in full remission ([name], [name], NP   1 year ago Encounter for annual physical exam   [name], [name], NP   1 year ago Upper respiratory tract infection, unspecified type   [name], [name], NP   2 years ago Essential hypertension   [name], [name], NP      Future Appointments            Tomorrow [name], NP [place], [facility]           . fluticasone (FLONASE) 50 MCG/ACT nasal spray 48 g 0    Sig: Place 2 sprays into both nostrils daily.     Ear, Nose, and Throat: Nasal Preparations - Corticosteroids Passed - 01/26/2022  2:22 PM      Passed - Valid encounter within last 12 months    Recent Outpatient Visits          5 months ago Moderate persistent asthma without complication   [name], [name], MD   1 year ago Recurrent major depressive disorder, in full remission ([place])   [name], [name], NP   1 year ago Encounter for annual physical exam   [name], [name], NP   1 year ago Upper respiratory tract infection,  unspecified type   [name], [name], NP   2 years ago Essential hypertension   [name], [name], NP      Future Appointments            Tomorrow [name], NP [place], [facility]           . PARoxetine (PAXIL) 20 MG tablet 90 tablet 0    Sig: Take 1 tablet (20 mg total) by mouth daily.     Psychiatry:  Antidepressants - SSRI Passed - 01/26/2022  2:22 PM      Passed - Completed PHQ-2 or PHQ-9 in the last 360 days      Passed - Valid encounter within last 6 months    Recent Outpatient Visits          5 months ago Moderate persistent asthma without complication   [name], [name], MD   1 year ago Recurrent major depressive disorder, in full remission ([place])   [name], [name], NP   1 year ago Encounter for annual physical exam  [name], [name], NP   1 year ago Upper respiratory tract infection, unspecified type   [name], [name], NP   2 years ago Essential hypertension   [name], [name], NP      Future Appointments            Tomorrow [name], [name], NP [facility], [facility]

## 2022-01-28 ENCOUNTER — Ambulatory Visit (INDEPENDENT_AMBULATORY_CARE_PROVIDER_SITE_OTHER): Payer: Medicare HMO | Admitting: Nurse Practitioner

## 2022-01-28 ENCOUNTER — Encounter: Payer: Self-pay | Admitting: Nurse Practitioner

## 2022-01-28 VITALS — BP 122/75 | HR 62 | Temp 98.0°F | Ht 73.0 in | Wt 179.7 lb

## 2022-01-28 DIAGNOSIS — E7800 Pure hypercholesterolemia, unspecified: Secondary | ICD-10-CM | POA: Diagnosis not present

## 2022-01-28 DIAGNOSIS — Z23 Encounter for immunization: Secondary | ICD-10-CM | POA: Diagnosis not present

## 2022-01-28 DIAGNOSIS — I10 Essential (primary) hypertension: Secondary | ICD-10-CM | POA: Diagnosis not present

## 2022-01-28 DIAGNOSIS — R7989 Other specified abnormal findings of blood chemistry: Secondary | ICD-10-CM

## 2022-01-28 DIAGNOSIS — M25562 Pain in left knee: Secondary | ICD-10-CM | POA: Diagnosis not present

## 2022-01-28 DIAGNOSIS — G8929 Other chronic pain: Secondary | ICD-10-CM

## 2022-01-28 DIAGNOSIS — E559 Vitamin D deficiency, unspecified: Secondary | ICD-10-CM | POA: Diagnosis not present

## 2022-01-28 DIAGNOSIS — F3342 Major depressive disorder, recurrent, in full remission: Secondary | ICD-10-CM

## 2022-01-28 DIAGNOSIS — E871 Hypo-osmolality and hyponatremia: Secondary | ICD-10-CM

## 2022-01-28 DIAGNOSIS — J4540 Moderate persistent asthma, uncomplicated: Secondary | ICD-10-CM

## 2022-01-28 DIAGNOSIS — R972 Elevated prostate specific antigen [PSA]: Secondary | ICD-10-CM | POA: Diagnosis not present

## 2022-01-28 DIAGNOSIS — R69 Illness, unspecified: Secondary | ICD-10-CM | POA: Diagnosis not present

## 2022-01-28 MED ORDER — SHINGRIX 50 MCG/0.5ML IM SUSR: 0.5000 mL | Freq: Once | INTRAMUSCULAR | 0 refills | Status: AC

## 2022-01-28 MED ORDER — AMLODIPINE BESYLATE 5 MG PO TABS: 5.0000 mg | ORAL_TABLET | Freq: Two times a day (BID) | ORAL | 4 refills | Status: DC

## 2022-01-28 MED ORDER — ALBUTEROL SULFATE HFA 108 (90 BASE) MCG/ACT IN AERS
2.0000 | INHALATION_SPRAY | Freq: Four times a day (QID) | RESPIRATORY_TRACT | 3 refills | Status: DC | PRN
Start: 2022-01-28 — End: 2023-05-24

## 2022-01-28 MED ORDER — PAROXETINE HCL 20 MG PO TABS: 20.0000 mg | ORAL_TABLET | Freq: Every day | ORAL | 4 refills | Status: DC

## 2022-01-28 NOTE — Patient Instructions (Signed)

## 2022-01-28 NOTE — Assessment & Plan Note (Signed)
Chronic, stable with BP at goal today.  Continue Amlodipine 5 MG BID (patient preference), he tolerated Losartan and Lisinopril poorly.  Recommend he continue this as BP at goal and not on low side.  Continue to monitor BP at home and document. DASH diet at home.  Plan on labs today: CBC, CMP, TSH.  Return in 6 months.

## 2022-01-28 NOTE — Assessment & Plan Note (Signed)
Ongoing onrecent labs, check CMP today.  Is on Paxil which could lower NA+.  Recommend add a little table salt to diet. 

## 2022-01-28 NOTE — Assessment & Plan Note (Signed)
Chronic, ongoing.  He refuses statin at this time and wishes to continue to focus on lifestyle changes.  Discussed risk with current ASCVD score and recommendations for statin.  Will recheck lipid panel today, is not fasting.

## 2022-01-28 NOTE — Assessment & Plan Note (Signed)
Check Vit D level today and recommend continue daily supplement.

## 2022-01-28 NOTE — Assessment & Plan Note (Signed)
Chronic, ongoing with worsening -- would like a referral to ortho, placed this visit.

## 2022-01-28 NOTE — Assessment & Plan Note (Signed)
Chronic, stable.  Continue current medication regimen and adjust as needed. Denies SI/HI.  Follow-up in 6 months for mood. 

## 2022-01-28 NOTE — Assessment & Plan Note (Signed)
Noted recent labs, recheck PSA today.  Is at higher level of normal for his age and ethnicity.  If trend up then refer to urology.

## 2022-01-28 NOTE — Assessment & Plan Note (Signed)
Chronic, stable with minimal use of Albuterol.  Continue to monitor and adjust as needed.

## 2022-01-28 NOTE — Progress Notes (Signed)
BP 122/75   Pulse 62   Temp 98 F (36.7 C) (Oral)   Ht '6\' 1"'  (1.854 m)   Wt 179 lb 11.2 oz (81.5 kg)   SpO2 95%   BMI 23.71 kg/m    Subjective:    Patient ID: Jonathan Roberson., male    DOB: 07/27/1955, 66 y.o.   MRN: [id number]  HPI: Rodriguez Aguinaldo. is a 66 y.o. male  Chief Complaint  Patient presents with   Referral    Patient says he would like to discuss having a referral to a specialist to an Orthopedic for his knee. Patient says he think the bursa in the back of his L knee is enlarged. Patient says he thinks he may have injured the L knee. Patient says his L knee isn't stable.    Immunizations    Patient would like to discuss Shingles vaccination and would like to discuss with provider as he is currently having weekly Allergy injections.    HYPERTENSION / HYPERLIPIDEMIA Taking Amlodipine 5 MG BID.  No medications for cholesterol, does not wish to start -- is taking fish oil.  He tried Lisinopril and Losartan in past for BP, caused weird feelings. Satisfied with current treatment? yes Duration of hypertension: chronic BP monitoring frequency: not checking BP range:  BP medication side effects: no Duration of hyperlipidemia: chronic Cholesterol supplements: fish oil and bran Aspirin: yes Recent stressors: no Recurrent headaches: no Visual changes: no Palpitations: no Dyspnea: no Chest pain: no Lower extremity edema: occasional The 10-year ASCVD risk score (Arnett DK, et al., 2019) is: 11.8%   Values used to calculate the score:     Age: 17 years     Sex: Male     Is Non-Hispanic African American: No     Diabetic: No     Tobacco smoker: No     Systolic Blood Pressure: 824 mmHg     Is BP treated: Yes     HDL Cholesterol: 70 mg/dL     Total Cholesterol: 213 mg/dL  ASTHMA Has Albuterol, rarely uses. Asthma status: stable Satisfied with current treatment?: yes Albuterol/rescue inhaler frequency: rarely Dyspnea frequency: no Wheezing frequency:no Cough  frequency: no Nocturnal symptom frequency: no Limitation of activity: no Aerochamber/spacer use: no Visits to ER or Urgent Care in past year: no Pneumovax: Up to Date Influenza: Up to Date   ELEVATED PSA PSA 4.3 -- in higher end of normal range for ethnicity and age. Nocturia: 1-2x per night drinks lot of fluids -- baseline Urinary frequency: drinks lots of fluids -- baseline Incomplete voiding: no Urgency: no Weak urinary stream: no Straining to start stream: no Dysuria: no  DEPRESSION Continues on Paxil 20 MG.  Has history of low levels Vitamin D on labs and is taking supplement.  Recently retired. Mood status: stable Satisfied with current treatment?: yes Symptom severity: mild  Duration of current treatment : chronic Side effects: no Medication compliance: good compliance Psychotherapy/counseling: none Depressed mood: occasional Anxious mood: no Anhedonia: no Significant weight loss or gain: no Insomnia:  none Fatigue: no Feelings of worthlessness or guilt: no Impaired concentration/indecisiveness: occasional Suicidal ideations: no Hopelessness: no Crying spells: no    01/28/2022   11:31 AM 08/20/2021    4:07 PM 12/23/2020    3:21 PM 06/24/2020    3:08 PM 06/11/2020   10:02 AM  Depression screen PHQ 2/9  Decreased Interest 0 0 0 0 0  Down, Depressed, Hopeless 0 0 0 0 0  PHQ -  2 Score 0 0 0 0 0  Altered sleeping 0 0 0 0 0  Tired, decreased energy 1 2 0 0 0  Change in appetite  0 0 0 0  Feeling bad or failure about yourself  0 0 0 0 0  Trouble concentrating 0 0 0 0 0  Moving slowly or fidgety/restless 1 0 0 0 0  Suicidal thoughts 0 0 0 0 0  PHQ-9 Score 2 2 0 0 0  Difficult doing work/chores Not difficult at all Not difficult at all Not difficult at all        01/28/2022   11:31 AM 08/20/2021    4:08 PM 08/10/2018   10:43 AM 08/10/2018   10:42 AM  GAD 7 : Generalized Anxiety Score  Nervous, Anxious, on Edge 0 0  1  Control/stop worrying 0 0 1 1  Worry too  much - different things 0 0 1 1  Trouble relaxing 0 0 0 1  Restless 1 0 1 0  Easily annoyed or irritable 0 0 0 0  Afraid - awful might happen 0 0 0 0  Total GAD 7 Score 1 0  4  Anxiety Difficulty Not difficult at all Not difficult at all  Not difficult at all   KNEE PAIN Has ongoing left knee pain for several years.  Feels like bursa is inflamed.  Rides bike often which helps. Duration: chronic Involved knee: left Mechanism of injury: unknown Location:medial Onset: gradual Severity: mild  Quality:  aching Frequency: intermittent Radiation: no Aggravating factors:  sitting for long period Alleviating factors: riding the bike  Status: fluctuating Treatments attempted: riding bike and ASA  Relief with NSAIDs?:  No NSAIDs Taken Weakness with weight bearing or walking: no Sensation of giving way: no Locking: no Popping: no Bruising: no Swelling: occasional Redness: no Paresthesias/decreased sensation: no Fevers: no   Relevant past medical, surgical, family and social history reviewed and updated as indicated. Interim medical history since our last visit reviewed. Allergies and medications reviewed and updated.  Review of Systems  Constitutional:  Negative for activity change, diaphoresis, fatigue and fever.  Respiratory:  Negative for cough, chest tightness, shortness of breath and wheezing.   Cardiovascular:  Negative for chest pain, palpitations and leg swelling.  Gastrointestinal: Negative.   Musculoskeletal:  Positive for arthralgias.  Neurological: Negative.   Psychiatric/Behavioral: Negative.      Per HPI unless specifically indicated above     Objective:    BP 122/75   Pulse 62   Temp 98 F (36.7 C) (Oral)   Ht '6\' 1"'  (1.854 m)   Wt 179 lb 11.2 oz (81.5 kg)   SpO2 95%   BMI 23.71 kg/m   Wt Readings from Last 3 Encounters:  01/28/22 179 lb 11.2 oz (81.5 kg)  08/20/21 171 lb 9.6 oz (77.8 kg)  12/23/20 188 lb 9.6 oz (85.5 kg)    Physical Exam Vitals  and nursing note reviewed.  Constitutional:      General: He is awake. He is not in acute distress.    Appearance: He is well-developed and well-groomed. He is not ill-appearing.  HENT:     Head: Normocephalic and atraumatic.     Right Ear: Hearing normal. No drainage.     Left Ear: Hearing normal. No drainage.  Eyes:     General: Lids are normal.        Right eye: No discharge.        Left eye: No discharge.  Conjunctiva/sclera: Conjunctivae normal.     Pupils: Pupils are equal, round, and reactive to light.  Neck:     Thyroid: No thyromegaly.     Vascular: No carotid bruit.     Trachea: Trachea normal.  Cardiovascular:     Rate and Rhythm: Normal rate and regular rhythm.     Heart sounds: Normal heart sounds, S1 normal and S2 normal. No murmur heard.    No gallop.  Pulmonary:     Effort: Pulmonary effort is normal. No accessory muscle usage or respiratory distress.     Breath sounds: Normal breath sounds.  Abdominal:     General: Bowel sounds are normal.     Palpations: Abdomen is soft. There is no hepatomegaly or splenomegaly.  Musculoskeletal:        General: Normal range of motion.     Cervical back: Normal range of motion and neck supple.     Right knee: Normal.     Left knee: Swelling (mild) and crepitus present. No effusion, erythema or bony tenderness. Normal range of motion. Tenderness present over the medial joint line. Normal alignment.     Instability Tests: Anterior drawer test negative. Posterior drawer test negative. Medial McMurray test negative and lateral McMurray test negative.     Right lower leg: No edema.     Left lower leg: No edema.  Skin:    General: Skin is warm and dry.     Capillary Refill: Capillary refill takes less than 2 seconds.  Neurological:     Mental Status: He is alert and oriented to person, place, and time.  Psychiatric:        Mood and Affect: Mood normal.        Speech: Speech normal.        Behavior: Behavior normal. Behavior  is cooperative.        Thought Content: Thought content normal.    Results for orders placed or performed in visit on 12/23/20  T4, free  Result Value Ref Range   Free T4 1.40 0.82 - 1.77 ng/dL  TSH  Result Value Ref Range   TSH 0.173 (L) 0.450 - 4.500 uIU/mL  Lipid Panel w/o Chol/HDL Ratio  Result Value Ref Range   Cholesterol, Total 213 (H) 100 - 199 mg/dL   Triglycerides 87 0 - 149 mg/dL   HDL 70 >39 mg/dL   VLDL Cholesterol Cal 15 5 - 40 mg/dL   LDL Chol Calc (NIH) 128 (H) 0 - 99 mg/dL  Comprehensive metabolic panel  Result Value Ref Range   Glucose 89 65 - 99 mg/dL   BUN 13 8 - 27 mg/dL   Creatinine, Ser 0.80 0.76 - 1.27 mg/dL   eGFR 99 >59 mL/min/1.73   BUN/Creatinine Ratio 16 10 - 24   Sodium 132 (L) 134 - 144 mmol/L   Potassium 3.6 3.5 - 5.2 mmol/L   Chloride 90 (L) 96 - 106 mmol/L   CO2 27 20 - 29 mmol/L   Calcium 9.1 8.6 - 10.2 mg/dL   Total Protein 7.4 6.0 - 8.5 g/dL   Albumin 4.2 3.8 - 4.8 g/dL   Globulin, Total 3.2 1.5 - 4.5 g/dL   Albumin/Globulin Ratio 1.3 1.2 - 2.2   Bilirubin Total 0.9 0.0 - 1.2 mg/dL   Alkaline Phosphatase 65 44 - 121 IU/L   AST 14 0 - 40 IU/L   ALT 12 0 - 44 IU/L  PSA  Result Value Ref Range   Prostate Specific Ag, Serum 4.3 (  H) 0.0 - 4.0 ng/mL  Magnesium  Result Value Ref Range   Magnesium 2.0 1.6 - 2.3 mg/dL  Testosterone, free, total([facility])  Result Value Ref Range   Testosterone 661 264 - 916 ng/dL   Testosterone, Free 8.2 6.6 - 18.1 pg/mL   Sex Hormone Binding 79.7 (H) 19.3 - 76.4 nmol/L  VITAMIN D 25 Hydroxy (Vit-D Deficiency, Fractures)  Result Value Ref Range   Vit D, 25-Hydroxy 37.8 30.0 - 100.0 ng/mL  Vitamin B12  Result Value Ref Range   Vitamin B-12 481 232 - 1,245 pg/mL  CBC with Differential/Platelet  Result Value Ref Range   WBC 4.8 3.4 - 10.8 x10E3/uL   RBC 4.29 4.14 - 5.80 x10E6/uL   Hemoglobin 13.3 13.0 - 17.7 g/dL   Hematocrit 39.1 37.5 - 51.0 %   MCV 91 79 - 97 fL   MCH 31.0 26.6 - 33.0 pg    MCHC 34.0 31.5 - 35.7 g/dL   RDW 13.1 11.6 - 15.4 %   Platelets 200 150 - 450 x10E3/uL   Neutrophils 51 Not Estab. %   Lymphs 29 Not Estab. %   Monocytes 11 Not Estab. %   Eos 8 Not Estab. %   Basos 1 Not Estab. %   Neutrophils Absolute 2.4 1.4 - 7.0 x10E3/uL   Lymphocytes Absolute 1.4 0.7 - 3.1 x10E3/uL   Monocytes Absolute 0.6 0.1 - 0.9 x10E3/uL   EOS (ABSOLUTE) 0.4 0.0 - 0.4 x10E3/uL   Basophils Absolute 0.1 0.0 - 0.2 x10E3/uL   Immature Granulocytes 0 Not Estab. %   Immature Grans (Abs) 0.0 0.0 - 0.1 x10E3/uL      Assessment & Plan:   Problem List Items Addressed This Visit       Cardiovascular and Mediastinum   Hypertension    Chronic, stable with BP at goal today.  Continue Amlodipine 5 MG BID (patient preference), he tolerated Losartan and Lisinopril poorly.  Recommend he continue this as BP at goal and not on low side.  Continue to monitor BP at home and document. DASH diet at home.  Plan on labs today: CBC, CMP, TSH.  Return in 6 months.      Relevant Medications   amLODipine (NORVASC) 5 MG tablet   Other Relevant Orders   CBC with Differential/Platelet   Comprehensive metabolic panel   TSH     Respiratory   Moderate persistent asthma without complication    Chronic, stable with minimal use of Albuterol.  Continue to monitor and adjust as needed.        Relevant Medications   albuterol (VENTOLIN HFA) 108 (90 Base) MCG/ACT inhaler   Other Relevant Orders   CBC with Differential/Platelet     Other   Chronic pain of left knee    Chronic, ongoing with worsening -- would like a referral to ortho, placed this visit.      Relevant Medications   PARoxetine (PAXIL) 20 MG tablet   Other Relevant Orders   Ambulatory referral to Orthopedics   Elevated PSA measurement    Noted recent labs, recheck PSA today.  Is at higher level of normal for his age and ethnicity.        Relevant Orders   PSA   Hyperlipidemia    Chronic, ongoing.  He refuses statin at this  time and wishes to continue to focus on lifestyle changes.  Discussed risk with current ASCVD score and recommendations for statin.  Will recheck lipid panel today, is not fasting.  Relevant Medications   amLODipine (NORVASC) 5 MG tablet   Other Relevant Orders   Comprehensive metabolic panel   Lipid Panel w/o Chol/HDL Ratio   Hyponatremia    Ongoing onrecent labs, check CMP today.  Is on Paxil which could lower NA+.  Recommend add a little table salt to diet.      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  Continue current medication regimen and adjust as needed. Denies SI/HI.  Follow-up in 6 months for mood.      Relevant Medications   PARoxetine (PAXIL) 20 MG tablet   Vitamin D deficiency    Check Vit D level today and recommend continue daily supplement.      Relevant Orders   VITAMIN D 25 Hydroxy (Vit-D Deficiency, Fractures)   Other Visit Diagnoses     Need for shingles vaccine       Shingrix vaccines ordered   Relevant Medications   Zoster Vaccine Adjuvanted [facility]) injection   Zoster Vaccine Adjuvanted [facility]) injection   Pneumococcal vaccination given       PCV20 provided today   Relevant Orders   Pneumococcal conjugate vaccine 20-valent (Prevnar 20) (Completed)        Follow up plan: Return in about 6 months (around 07/30/2022) for HTN/HLD, MOOD, PSA CHECK.

## 2022-01-29 LAB — CBC WITH DIFFERENTIAL/PLATELET
Basophils Absolute: 0.1 10*3/uL (ref 0.0–0.2)
Basos: 1 %
EOS (ABSOLUTE): 0.3 10*3/uL (ref 0.0–0.4)
Eos: 9 %
Hematocrit: 38.5 % (ref 37.5–51.0)
Hemoglobin: 13 g/dL (ref 13.0–17.7)
Immature Grans (Abs): 0 10*3/uL (ref 0.0–0.1)
Immature Granulocytes: 0 %
Lymphocytes Absolute: 0.9 10*3/uL (ref 0.7–3.1)
Lymphs: 24 %
MCH: 31.4 pg (ref 26.6–33.0)
MCHC: 33.8 g/dL (ref 31.5–35.7)
MCV: 93 fL (ref 79–97)
Monocytes Absolute: 0.4 10*3/uL (ref 0.1–0.9)
Monocytes: 12 %
Neutrophils Absolute: 1.9 10*3/uL (ref 1.4–7.0)
Neutrophils: 54 %
Platelets: 180 10*3/uL (ref 150–450)
RBC: 4.14 x10E6/uL (ref 4.14–5.80)
RDW: 12.7 % (ref 11.6–15.4)
WBC: 3.6 10*3/uL (ref 3.4–10.8)

## 2022-01-29 LAB — COMPREHENSIVE METABOLIC PANEL
ALT: 20 IU/L (ref 0–44)
AST: 20 IU/L (ref 0–40)
Albumin/Globulin Ratio: 1.5 (ref 1.2–2.2)
Albumin: 4.4 g/dL (ref 3.9–4.9)
Alkaline Phosphatase: 69 IU/L (ref 44–121)
BUN/Creatinine Ratio: 12 (ref 10–24)
BUN: 9 mg/dL (ref 8–27)
Bilirubin Total: 1.2 mg/dL (ref 0.0–1.2)
CO2: 28 mmol/L (ref 20–29)
Calcium: 9.1 mg/dL (ref 8.6–10.2)
Chloride: 95 mmol/L — ABNORMAL LOW (ref 96–106)
Creatinine, Ser: 0.73 mg/dL — ABNORMAL LOW (ref 0.76–1.27)
Globulin, Total: 3 g/dL (ref 1.5–4.5)
Glucose: 87 mg/dL (ref 70–99)
Potassium: 3.8 mmol/L (ref 3.5–5.2)
Sodium: 133 mmol/L — ABNORMAL LOW (ref 134–144)
Total Protein: 7.4 g/dL (ref 6.0–8.5)
eGFR: 101 mL/min/{1.73_m2} (ref >59)

## 2022-01-29 LAB — LIPID PANEL W/O CHOL/HDL RATIO
Cholesterol, Total: 232 mg/dL — ABNORMAL HIGH (ref 100–199)
HDL: 92 mg/dL (ref >39)
LDL Chol Calc (NIH): 128 mg/dL — ABNORMAL HIGH (ref 0–99)
Triglycerides: 70 mg/dL (ref 0–149)
VLDL Cholesterol Cal: 12 mg/dL (ref 5–40)

## 2022-01-29 LAB — TSH: TSH: 0.179 u[IU]/mL — ABNORMAL LOW (ref 0.450–4.500)

## 2022-01-29 LAB — VITAMIN D 25 HYDROXY (VIT D DEFICIENCY, FRACTURES): Vit D, 25-Hydroxy: 26.4 ng/mL — ABNORMAL LOW (ref 30.0–100.0)

## 2022-01-29 LAB — PSA: Prostate Specific Ag, Serum: 5.9 ng/mL — ABNORMAL HIGH (ref 0.0–4.0)

## 2022-01-29 NOTE — Progress Notes (Addendum)
Please call patient to alert him on results too, as not always checking MyChart:  Good morning Jonathan Roberson, your labs have returned: - Kidney function, creatinine and eGFR, remains normal, as is liver function, AST and ALT.  Sodium remains on low side at 133, please add some tablet salt to your diet.  I suspect this may be coming from your Paxil, which I want to maintain on board, but at times this family of medications can lower sodium levels. - Cholesterol levels are elevated still, I do recommend statin use still, but know you prefer focus on diet and supplements - continue this. - Thyroid lab remains in hyperthyroid range -- I am going to order a thyroid ultrasound to further assess as this has been present on 3 checks now + referral to endocrinology. - CBC is normal. - Vitamin D remains on lower side, continue supplement. - PSA, prostate lab, has trended up.  I am going to place a referral to urology for further assessment of this.  Any questions? Keep being stellar!!  Thank you for allowing me to participate in your care.  I appreciate you. Kindest regards, Lateefah Mallery

## 2022-01-29 NOTE — Assessment & Plan Note (Signed)
Recheck TSH today and if remains low refer to endo and obtain ultrasound.

## 2022-01-29 NOTE — Addendum Note (Signed)
Addended by: Marnee Guarneri T on: 01/29/2022 08:18 AM   Modules accepted: Orders

## 2022-01-30 ENCOUNTER — Ambulatory Visit: Payer: Medicare HMO

## 2022-02-03 DIAGNOSIS — J301 Allergic rhinitis due to pollen: Secondary | ICD-10-CM | POA: Diagnosis not present

## 2022-02-04 ENCOUNTER — Ambulatory Visit: Payer: Medicare HMO | Admitting: Urology

## 2022-02-04 ENCOUNTER — Encounter: Payer: Self-pay | Admitting: Urology

## 2022-02-04 VITALS — BP 123/76 | HR 90 | Ht 73.0 in | Wt 180.0 lb

## 2022-02-04 DIAGNOSIS — R3989 Other symptoms and signs involving the genitourinary system: Secondary | ICD-10-CM

## 2022-02-04 DIAGNOSIS — R972 Elevated prostate specific antigen [PSA]: Secondary | ICD-10-CM

## 2022-02-04 NOTE — Progress Notes (Signed)
02/04/2022 3:20 PM   Jonathan Roberson. April 13, 1956 [id number]  Referring provider: [name], NP [address] [place],  [place] [address]  Chief Complaint  Patient presents with   Elevated PSA    HPI: Jonathan Nicol. is a 66 y.o. male referred for evaluation of an elevated PSA  PSA levels January 2022 and July 2022 were both 4.3 Repeat PSA 01/28/2022 has increased to 5.9 PSA 2019 and 2021 in the mid 2/low 3 range No family history prostate cancer No bothersome LUTS Rare episodes of urge incontinence at night though he relates to drinking a significant amount of fluid   PMH: Past Medical History:  Diagnosis Date   Allergy    Allergy to dust    Arthritis    hands, hips, knees   Asthma    Depression    Hyperlipidemia    Hypertension     Surgical History: Past Surgical History:  Procedure Laterality Date   BIOPSY N/A 08/14/2016   Procedure: BIOPSY;  Surgeon: Lucilla Lame, MD;  Location: [place];  Service: Endoscopy;  Laterality: N/A;   COLONOSCOPY WITH PROPOFOL N/A 08/14/2016   Procedure: COLONOSCOPY WITH PROPOFOL;  Surgeon: Lucilla Lame, MD;  Location: [place];  Service: Endoscopy;  Laterality: N/A;   IMAGE GUIDED SINUS SURGERY Bilateral 09/24/2015   Procedure: IMAGE GUIDED SINUS SURGERY Bilateral nasal polypectomy,maxillary antrostomy with tissue removal,bilateral ethmoidectomy,frontal sinusotomy;  Surgeon: Clyde Canterbury, MD;  Location: [place];  Service: ENT;  Laterality: Bilateral;  GAVE DISK TO CE CE 4-12 KP   NASAL SINUS SURGERY  2005   Dr. Nadeen Landau, [facility]    Home Medications:  Allergies as of 02/04/2022       Reactions   Losartan Shortness Of Breath   Shrimp [shellfish Allergy] Shortness Of Breath   When eaten in large amounts   Monosodium Glutamate    headaches   Amoxicillin Rash   Erythromycin Rash   Penicillins Rash        Medication List        Accurate as of February 04, 2022  3:20 PM. If you have  any questions, ask your nurse or doctor.          albuterol (2.5 MG/3ML) 0.083% nebulizer solution Commonly known as: PROVENTIL USE 1 VIAL (3 ML) (2.5 MG TOTAL) VIA NEBULIZER EVERY 6 HOURS AS NEEDED FOR WHEEZING OR SHORTNESS OF BREATH   albuterol 108 (90 Base) MCG/ACT inhaler Commonly known as: VENTOLIN HFA Inhale 2 puffs into the lungs every 6 (six) hours as needed for wheezing or shortness of breath.   amLODipine 5 MG tablet Commonly known as: NORVASC Take 1 tablet (5 mg total) by mouth in the morning and at bedtime.   Aspirin Buf(CaCarb-MgCarb-MgO) 81 MG Tabs Take by mouth.   b complex vitamins tablet Take 1 tablet by mouth daily.   fluticasone 50 MCG/ACT nasal spray Commonly known as: FLONASE Place 2 sprays into both nostrils daily.   fluticasone-salmeterol 100-50 MCG/ACT Aepb Commonly known as: ADVAIR Inhale 1 puff into the lungs 2 (two) times daily.   GLUCOSAMINE CHONDR COMPLEX PO Take by mouth.   loratadine 10 MG tablet Commonly known as: CLARITIN Take 10 mg by mouth daily as needed for allergies.   PARoxetine 20 MG tablet Commonly known as: PAXIL Take 1 tablet (20 mg total) by mouth daily.   VITAMIN C PO Take by mouth.   VITAMIN D (ERGOCALCIFEROL) PO Take 1 tablet by mouth daily.  Allergies:  Allergies  Allergen Reactions   Losartan Shortness Of Breath   Shrimp [Shellfish Allergy] Shortness Of Breath    When eaten in large amounts   Monosodium Glutamate     headaches   Amoxicillin Rash   Erythromycin Rash   Penicillins Rash    Family History: Family History  Problem Relation Age of Onset   Heart disease Mother    Depression Mother    Arthritis Sister    Emphysema Father    Depression Daughter     Social History:  reports that he has never smoked. He has never used smokeless tobacco. He reports that he does not currently use alcohol. He reports that he does not use drugs.   Physical Exam: BP 123/76   Pulse 90   Ht '6\' 1"'$   (1.854 m)   Wt 180 lb (81.6 kg)   BMI 23.75 kg/m   Constitutional:  Alert and oriented, No acute distress. HEENT: [place] AT Respiratory: Normal respiratory effort, no increased work of breathing. GU: Prostate 50 g.  Asymmetry L >R with increase firmness left Psychiatric: Normal mood and affect.  Laboratory Data: Lab Results  Component Value Date   CREATININE 0.73 (L) 01/28/2022     Assessment & Plan:    1.  Elevated PSA Rising PSA with abnormal DRE Although PSA is a prostate cancer screening test he was informed that cancer is not the most common cause of an elevated PSA. Other potential causes including BPH and inflammation were discussed. He was informed that the only way to adequately diagnose prostate cancer would be a transrectal ultrasound and biopsy of the prostate. The procedure was discussed including potential risks of bleeding and infection/sepsis. He was also informed that a negative biopsy does not conclusively rule out the possibility that prostate cancer may be present and that continued monitoring is required. The use of multiparametric prostate MRI to assess for lesions suspicious for high-grade prostate cancer and aid in targeted biopsy was reviewed. Continued periodic surveillance was also discussed but not recommended. He would like to check and see if his insurance will cover an MRI.  Order was entered.    Abbie Sons, [place] [address] [place], [place] [address] [phone]

## 2022-02-05 ENCOUNTER — Encounter: Payer: Self-pay | Admitting: Nurse Practitioner

## 2022-02-08 ENCOUNTER — Encounter: Payer: Self-pay | Admitting: Urology

## 2022-02-09 ENCOUNTER — Telehealth: Payer: Self-pay | Admitting: Urology

## 2022-02-09 NOTE — Telephone Encounter (Signed)
Pt calling to see if the MRI has been scheduled.  Please advise pt [phone]

## 2022-02-17 DIAGNOSIS — J301 Allergic rhinitis due to pollen: Secondary | ICD-10-CM | POA: Diagnosis not present

## 2022-02-18 ENCOUNTER — Ambulatory Visit
Admission: RE | Admit: 2022-02-18 | Discharge: 2022-02-18 | Disposition: A | Discharge status: Home / Self Care | Payer: Medicare HMO | Source: Ambulatory Visit | Attending: Urology | Admitting: Urology

## 2022-02-18 DIAGNOSIS — K5730 Diverticulosis of large intestine without perforation or abscess without bleeding: Secondary | ICD-10-CM | POA: Diagnosis not present

## 2022-02-18 DIAGNOSIS — R972 Elevated prostate specific antigen [PSA]: Secondary | ICD-10-CM | POA: Diagnosis not present

## 2022-02-18 DIAGNOSIS — R3989 Other symptoms and signs involving the genitourinary system: Secondary | ICD-10-CM | POA: Insufficient documentation

## 2022-02-18 DIAGNOSIS — R590 Localized enlarged lymph nodes: Secondary | ICD-10-CM | POA: Diagnosis not present

## 2022-02-18 MED ORDER — GADOPICLENOL 0.5 MMOL/ML IV SOLN
7.5000 mL | Freq: Once | INTRAVENOUS | Status: AC | PRN
  Administered 2022-02-18: 7.5 mL via INTRAVENOUS

## 2022-02-20 ENCOUNTER — Telehealth: Payer: Self-pay | Admitting: Urology

## 2022-02-20 DIAGNOSIS — R972 Elevated prostate specific antigen [PSA]: Secondary | ICD-10-CM

## 2022-02-20 NOTE — Telephone Encounter (Signed)
I contacted Jonathan Roberson regarding his prostate MRI results.  He had 3 abnormal lesion noted: A PI-RADS 5 lesion left anterior transition zone; PI-RADS 5 5 lesion left posteromedial peripheral zone and a PI-RADS 4 lesion right posteromedial peripheral zone  The prostate volume was 29 cc.  No evidence of extracapsular extension.  Some mildly reduced signal in the right SV but no definite radiographic evidence of involvement.  No pelvic adenopathy or pelvic bony metastasis  Recommended scheduling fusion biopsy at [facility] in [place].  He desires to proceed with further evaluation and referral placed

## 2022-02-22 ENCOUNTER — Encounter: Payer: Self-pay | Admitting: Nurse Practitioner

## 2022-02-23 ENCOUNTER — Encounter: Payer: Self-pay | Admitting: Urology

## 2022-02-24 DIAGNOSIS — J301 Allergic rhinitis due to pollen: Secondary | ICD-10-CM | POA: Diagnosis not present

## 2022-03-02 ENCOUNTER — Ambulatory Visit
Admission: RE | Admit: 2022-03-02 | Discharge: 2022-03-02 | Disposition: A | Discharge status: Home / Self Care | Payer: Medicare HMO | Source: Ambulatory Visit | Attending: Nurse Practitioner | Admitting: Nurse Practitioner

## 2022-03-02 DIAGNOSIS — R7989 Other specified abnormal findings of blood chemistry: Secondary | ICD-10-CM | POA: Diagnosis not present

## 2022-03-02 DIAGNOSIS — E0590 Thyrotoxicosis, unspecified without thyrotoxic crisis or storm: Secondary | ICD-10-CM | POA: Diagnosis not present

## 2022-03-02 NOTE — Progress Notes (Signed)
Contacted via MyChart-- he has not scheduled with endo, can you provide him # of who to call to schedule, appears they did try to call him:)

## 2022-03-03 ENCOUNTER — Encounter: Payer: Self-pay | Admitting: Nurse Practitioner

## 2022-03-03 DIAGNOSIS — J301 Allergic rhinitis due to pollen: Secondary | ICD-10-CM | POA: Diagnosis not present

## 2022-03-06 ENCOUNTER — Encounter: Payer: Self-pay | Admitting: Nurse Practitioner

## 2022-03-10 DIAGNOSIS — J301 Allergic rhinitis due to pollen: Secondary | ICD-10-CM | POA: Diagnosis not present

## 2022-03-17 DIAGNOSIS — J301 Allergic rhinitis due to pollen: Secondary | ICD-10-CM | POA: Diagnosis not present

## 2022-03-20 DIAGNOSIS — J301 Allergic rhinitis due to pollen: Secondary | ICD-10-CM | POA: Diagnosis not present

## 2022-03-24 DIAGNOSIS — C61 Malignant neoplasm of prostate: Secondary | ICD-10-CM | POA: Diagnosis not present

## 2022-03-27 DIAGNOSIS — J301 Allergic rhinitis due to pollen: Secondary | ICD-10-CM | POA: Diagnosis not present

## 2022-03-31 ENCOUNTER — Telehealth: Payer: Self-pay | Admitting: Urology

## 2022-03-31 ENCOUNTER — Encounter: Payer: Self-pay | Admitting: Urology

## 2022-03-31 DIAGNOSIS — C61 Malignant neoplasm of prostate: Secondary | ICD-10-CM

## 2022-03-31 DIAGNOSIS — J301 Allergic rhinitis due to pollen: Secondary | ICD-10-CM | POA: Diagnosis not present

## 2022-03-31 NOTE — Telephone Encounter (Signed)
I have not seen the biopsy results.  Does not look like they have been scanned in the chart.  I sent the patient a MyChart message stating we have not received.  Will need to contact [facility] and get them faxed over if they are back

## 2022-03-31 NOTE — Telephone Encounter (Signed)
Patient called earlier today regarding prostate biopsy results.  We had not received from [facility] and they were faxed over.  Only the pathology report was faxed and not the office note/TRUS.  He had no postbiopsy problems.  He underwent a total of 18 biopsies: 2 cores each of the 3 ROI lesions and standard 12 core template biopsy.  6/6 cores of ROI biopsies were positive for Gleason 3+4 in lesion 1 and 2 and Gleason 3+3 and lesion 3.  10/12 cores were positive on the standard template biopsy with the next pathology 3+3, 3+4 and 4+3.  The pathology report was discussed in detail.  Recommend staging evaluation with PSMA/PET.  If no evidence of metastatic disease we briefly discussed curative options of RALP and radiation modalities including IMRT and brachytherapy.  All questions were answered and he was instructed to call back should he have any additional questions.  PSMA/PET ordered and will call with results.

## 2022-03-31 NOTE — Telephone Encounter (Signed)
Pt calling wants to know if we got the biopsy results from [facility].  [phone]

## 2022-04-01 ENCOUNTER — Encounter: Payer: Self-pay | Admitting: Urology

## 2022-04-07 DIAGNOSIS — J301 Allergic rhinitis due to pollen: Secondary | ICD-10-CM | POA: Diagnosis not present

## 2022-04-14 ENCOUNTER — Ambulatory Visit
Admission: RE | Admit: 2022-04-14 | Discharge: 2022-04-14 | Disposition: A | Discharge status: Home / Self Care | Payer: Medicare HMO | Source: Ambulatory Visit | Attending: Urology | Admitting: Urology

## 2022-04-14 DIAGNOSIS — J301 Allergic rhinitis due to pollen: Secondary | ICD-10-CM | POA: Diagnosis not present

## 2022-04-14 DIAGNOSIS — K429 Umbilical hernia without obstruction or gangrene: Secondary | ICD-10-CM | POA: Diagnosis not present

## 2022-04-14 DIAGNOSIS — I700 Atherosclerosis of aorta: Secondary | ICD-10-CM | POA: Insufficient documentation

## 2022-04-14 DIAGNOSIS — C61 Malignant neoplasm of prostate: Secondary | ICD-10-CM | POA: Insufficient documentation

## 2022-04-14 MED ORDER — PIFLIFOLASTAT F 18 (PYLARIFY) INJECTION
9.0000 | Freq: Once | INTRAVENOUS | Status: AC
  Administered 2022-04-14: 9.67 via INTRAVENOUS

## 2022-04-15 ENCOUNTER — Ambulatory Visit: Payer: Medicare HMO | Admitting: "Endocrinology

## 2022-04-15 ENCOUNTER — Encounter: Payer: Self-pay | Admitting: "Endocrinology

## 2022-04-15 VITALS — BP 130/86 | HR 60 | Ht 73.0 in | Wt 183.0 lb

## 2022-04-15 DIAGNOSIS — E782 Mixed hyperlipidemia: Secondary | ICD-10-CM

## 2022-04-15 DIAGNOSIS — R946 Abnormal results of thyroid function studies: Secondary | ICD-10-CM | POA: Insufficient documentation

## 2022-04-15 DIAGNOSIS — I10 Essential (primary) hypertension: Secondary | ICD-10-CM | POA: Diagnosis not present

## 2022-04-15 MED ORDER — BD PEN NEEDLE NANO U/F 32G X 4 MM MISC: 1.0000 | Freq: Four times a day (QID) | 5 refills | Status: DC

## 2022-04-15 NOTE — Progress Notes (Signed)
Endocrinology Consult Note                                            04/15/2022, 12:33 PM   Subjective:    Patient ID: Jonathan Phenix., male    DOB: 1956/04/06, PCP [name], NP   Past Medical History:  Diagnosis Date   Allergy    Allergy to dust    Arthritis    hands, hips, knees   Asthma    Depression    Hyperlipidemia    Hypertension    Thyroid disease    Past Surgical History:  Procedure Laterality Date   BIOPSY N/A 08/14/2016   Procedure: BIOPSY;  Surgeon: Lucilla Lame, MD;  Location: [place];  Service: Endoscopy;  Laterality: N/A;   COLONOSCOPY WITH PROPOFOL N/A 08/14/2016   Procedure: COLONOSCOPY WITH PROPOFOL;  Surgeon: Lucilla Lame, MD;  Location: [place];  Service: Endoscopy;  Laterality: N/A;   IMAGE GUIDED SINUS SURGERY Bilateral 09/24/2015   Procedure: IMAGE GUIDED SINUS SURGERY Bilateral nasal polypectomy,maxillary antrostomy with tissue removal,bilateral ethmoidectomy,frontal sinusotomy;  Surgeon: Clyde Canterbury, MD;  Location: [place];  Service: ENT;  Laterality: Bilateral;  GAVE DISK TO CE CE 4-12 KP   NASAL SINUS SURGERY  2005   Dr. Nadeen Landau, [facility]   Social History   Socioeconomic History   Marital status: Married    Spouse name: Not on file   Number of children: Not on file   Years of education: Not on file   Highest education level: Not on file  Occupational History   Not on file  Tobacco Use   Smoking status: Never   Smokeless tobacco: Never  Vaping Use   Vaping Use: Never used  Substance and Sexual Activity   Alcohol use: Not Currently    Alcohol/week: 0.0 standard drinks of alcohol    Comment: on occasion- about once a year   Drug use: No   Sexual activity: Yes  Other Topics Concern   Not on file  Social History Narrative   Not on file   Social Determinants of Health   Financial Resource Strain: Not on file  Food Insecurity: Not on file  Transportation Needs: Not on file   Physical Activity: Not on file  Stress: Not on file  Social Connections: Not on file   Family History  Problem Relation Age of Onset   Heart disease Mother    Depression Mother    Arthritis Sister    Emphysema Father    Depression Daughter    Outpatient Encounter Medications as of 04/15/2022  Medication Sig   NON FORMULARY Allergy injections weekly   [DISCONTINUED] Insulin Pen Needle (BD PEN NEEDLE NANO U/F) 32G X 4 MM MISC 1 each by Does not apply route 4 (four) times daily.   albuterol (PROVENTIL) (2.5 MG/3ML) 0.083% nebulizer solution USE 1 VIAL (3 ML) (2.5 MG TOTAL) VIA NEBULIZER EVERY 6 HOURS AS NEEDED FOR WHEEZING OR SHORTNESS OF BREATH   albuterol (VENTOLIN HFA) 108 (90 Base) MCG/ACT inhaler Inhale 2 puffs into the lungs every 6 (six) hours as needed for wheezing or shortness of breath.   amLODipine (NORVASC) 5 MG tablet Take 1 tablet (5 mg total) by mouth in the morning and at bedtime.   Ascorbic Acid (VITAMIN C PO) Take by mouth.   Aspirin Buf,CaCarb-MgCarb-MgO,  81 MG TABS Take by mouth. (Patient not taking: Reported on 04/15/2022)   b complex vitamins tablet Take 1 tablet by mouth daily. (Patient not taking: Reported on 04/15/2022)   fluticasone (FLONASE) 50 MCG/ACT nasal spray Place 2 sprays into both nostrils daily.   fluticasone-salmeterol (ADVAIR) 100-50 MCG/ACT AEPB Inhale 1 puff into the lungs 2 (two) times daily.   Glucosamine-Chondroitin (GLUCOSAMINE CHONDR COMPLEX PO) Take by mouth. (Patient not taking: Reported on 04/15/2022)   loratadine (CLARITIN) 10 MG tablet Take 10 mg by mouth daily as needed for allergies.   PARoxetine (PAXIL) 20 MG tablet Take 1 tablet (20 mg total) by mouth daily.   VITAMIN D, ERGOCALCIFEROL, PO Take 1 tablet by mouth daily. (Patient not taking: Reported on 04/15/2022)   No facility-administered encounter medications on file as of 04/15/2022.   ALLERGIES: Allergies  Allergen Reactions   Losartan Shortness Of Breath   Shrimp [Shellfish  Allergy] Shortness Of Breath    When eaten in large amounts   Monosodium Glutamate     headaches   Amoxicillin Rash   Erythromycin Rash   Penicillins Rash    VACCINATION STATUS: Immunization History  Administered Date(s) Administered   Influenza,inj,Quad PF,6+ Mos 05/11/2018   Influenza-Unspecified 03/02/2016, 03/22/2017, 03/16/2019   PFIZER(Purple Top)SARS-COV-2 Vaccination 08/11/2019, 09/01/2019   PNEUMOCOCCAL CONJUGATE-20 01/28/2022   Pneumococcal Polysaccharide-23 04/18/2014   Tdap 04/07/2013    HPI [name] is 66 y.o. male who presents today with a medical history as above. he is being seen in consultation for hyperthyroidism requested by Marnee Guarneri T, NP.  he has been dealing with symptoms of heat intolerance, + D energy level for the last several weeks.  He also noticed fluctuating body weight.  Also reports photosensitivity, sleep disturbance, fatigue, irritability. He was found to have suppressed TSH during 3 different measurements from January 2022. His measurements included T4 normal T3. Denies dysphagia, shortness of breath, nor voice change. He underwent thyroid ultrasound on March 02, 2022 which showed normal thyroid ultrasound. Specifically, no evidence of thyromegaly, atrophy or discrete thyroid nodule or mass. On antithyroid medication at this time.  He has family history of hypothyroidism in his mother.  No family history of thyroid malignancy. His other medical problems include hyperlipidemia and hypertension.  He is not on treatment for hyperlipidemia. He is a non-smoker, uses alcohol socially.  He is not following any particular diet program.    Review of Systems  Constitutional: + Fluctuating body weight,  +fatigue, no subjective hyperthermia, no subjective hypothermia Eyes: no blurry vision, no xerophthalmia ENT: no sore throat, no nodules palpated in throat, no dysphagia/odynophagia, no hoarseness Cardiovascular: no Chest Pain, no  Shortness of Breath, no palpitations, no leg swelling Respiratory: no cough, no shortness of breath Gastrointestinal: no Nausea/Vomiting/Diarhhea Musculoskeletal: no muscle/joint aches Skin: no rashes Neurological: no tremors, no numbness, no tingling, no dizziness Psychiatric: no depression, no anxiety  Objective:       04/15/2022   10:17 AM 02/04/2022    3:06 PM 01/28/2022   11:22 AM  Vitals with BMI  Height '6\' 1"'$  '6\' 1"'$  '6\' 1"'$   Weight 183 lbs 180 lbs 179 lbs 11 oz  BMI 24.15 48.88 91.69  Systolic 450 388 828  Diastolic 86 76 75  Pulse 60 90 62    BP 130/86   Pulse 60   Ht '6\' 1"'$  (1.854 m)   Wt 183 lb (83 kg)   BMI 24.14 kg/m   Wt Readings from Last 3 Encounters:  04/15/22  183 lb (83 kg)  02/04/22 180 lb (81.6 kg)  01/28/22 179 lb 11.2 oz (81.5 kg)    Physical Exam  Constitutional:  Body mass index is 24.14 kg/m.,  not in acute distress, normal state of mind Eyes: PERRLA, EOMI, no exophthalmos ENT: moist mucous membranes, no gross thyromegaly, no gross cervical lymphadenopathy Cardiovascular: normal precordial activity, Regular Rate and Rhythm, no Murmur/Rubs/Gallops Respiratory:  adequate breathing efforts, no gross chest deformity, Clear to auscultation bilaterally Gastrointestinal: abdomen soft, Non -tender, No distension, Bowel Sounds present, no gross organomegaly Musculoskeletal: no gross deformities, strength intact in all four extremities Skin: moist, warm, no rashes Neurological: no tremor with outstretched hands, Deep tendon reflexes normal in bilateral lower extremities.  CMP ( most recent) CMP     Component Value Date/Time   NA 133 (L) 01/28/2022 1156   K 3.8 01/28/2022 1156   CL 95 (L) 01/28/2022 1156   CO2 28 01/28/2022 1156   GLUCOSE 87 01/28/2022 1156   BUN 9 01/28/2022 1156   CREATININE 0.73 (L) 01/28/2022 1156   CALCIUM 9.1 01/28/2022 1156   PROT 7.4 01/28/2022 1156   ALBUMIN 4.4 01/28/2022 1156   AST 20 01/28/2022 1156   ALT 20 01/28/2022  1156   ALKPHOS 69 01/28/2022 1156   BILITOT 1.2 01/28/2022 1156   GFRNONAA 93 06/24/2020 1555   GFRAA 108 06/24/2020 1555       Lipid Panel ( most recent) Lipid Panel     Component Value Date/Time   CHOL 232 (H) 01/28/2022 1156   TRIG 70 01/28/2022 1156   HDL 92 01/28/2022 1156   LDLCALC 128 (H) 01/28/2022 1156   LABVLDL 12 01/28/2022 1156      Lab Results  Component Value Date   TSH 0.179 (L) 01/28/2022   TSH 0.173 (L) 12/23/2020   TSH 0.021 (L) 06/24/2020   TSH 0.675 06/19/2019   TSH 0.947 06/29/2017   TSH 0.686 06/24/2016   TSH 1.020 04/29/2015   FREET4 1.40 12/23/2020    Reviewed ultrasound on March 02, 2022: Total right and left lobes measure 5.1 cm in greatest dimension. Normal thyroid ultrasound. Specifically, no evidence of thyromegaly, atrophy or discrete thyroid nodule or mass.  Assessment & Plan:   1. Abnormal thyroid function test 2. hyper Lipidemia  - Jonathan Phenix.  is being seen at a kind request of [name], [name], NP. - I have reviewed his available thyroid records and clinically evaluated the patient. - Based on these reviews, he has several instances of suppressed TSH which may be indicated.  Primary hypothyroidism,  however,  there is not sufficient information to proceed with definitive treatment plan.  It is important to confirm diagnosis before considering treatment options.  He will need thyroid uptake and scan and return for reevaluation in 2 weeks.   Options of treatment were briefly discussed with him. If he returns with uptake result confirming Graves' disease or significant uptake, he will be considered for radioactive iodine thyroid ablation.  Thyroid ultrasound is unremarkable, will not need intervention for now.  Regarding his severe dyslipidemia with LDL of 126, putting him at risk for cardiovascular diseases, he was offered lifestyle medicine involving whole food plant-based diet.   - he is advised to maintain close  follow up with [name], NP for primary care needs.   - Time spent with the patient: 50 minutes, of which >50% was spent in  counseling him about his hyperthyroidism, hyperlipidemia and the rest in obtaining information about  his symptoms, reviewing his previous labs/studies ( including abstractions from other facilities),  evaluations, and treatments,  and developing a plan to confirm diagnosis and long term treatment based on the latest standards of care/guidelines; and documenting his care.  Jonathan Phenix. participated in the discussions, expressed understanding, and voiced agreement with the above plans.  All questions were answered to his satisfaction. he is encouraged to contact clinic should he have any questions or concerns prior to his return visit.  Follow up plan: Return in about 2 weeks (around 04/29/2022) for F/U with Thyroid Uptake and Scan.   Glade Lloyd, MD [facility] [address] [place], [place] [address] Phone: [phone]  Fax: [phone]     04/15/2022, 12:33 PM  This note was partially dictated with voice recognition software. Similar sounding words can be transcribed inadequately or may not  be corrected upon review.

## 2022-04-16 ENCOUNTER — Encounter: Payer: Self-pay | Admitting: "Endocrinology

## 2022-04-17 ENCOUNTER — Encounter: Payer: Self-pay | Admitting: Urology

## 2022-04-21 ENCOUNTER — Encounter: Payer: Self-pay | Admitting: Urology

## 2022-04-21 DIAGNOSIS — J301 Allergic rhinitis due to pollen: Secondary | ICD-10-CM | POA: Diagnosis not present

## 2022-04-28 DIAGNOSIS — J301 Allergic rhinitis due to pollen: Secondary | ICD-10-CM | POA: Diagnosis not present

## 2022-04-30 ENCOUNTER — Encounter: Payer: Self-pay | Admitting: Urology

## 2022-04-30 ENCOUNTER — Ambulatory Visit: Payer: Medicare HMO | Admitting: Urology

## 2022-04-30 VITALS — BP 149/96 | HR 79 | Ht 74.0 in | Wt 185.0 lb

## 2022-04-30 DIAGNOSIS — C61 Malignant neoplasm of prostate: Secondary | ICD-10-CM

## 2022-04-30 NOTE — Progress Notes (Signed)
04/30/2022 5:17 PM   Jonathan Roberson. 1956-05-17 [id number]  Referring provider: [name], NP [address] [place],  [place] [address]  Chief Complaint  Patient presents with   Results    Urologic history: 1.  Prostate cancer cT2N0M0 unfavorable intermediate risk PSA 5.9 on 01/28/2022; DRE firm left prostate MRI 01/2022 PI-RADS 5 lesion left PZ; PI-RADS 4 lesion right PZ; volume 29 cc MR fusion biopsy [facility] 03/24/2022; prostate volume 29 cc; 18 cores obtain; 2 ROI biopsies were positive for Gleason 3+4 adenocarcinoma in 1 Gleason 3+3 adenocarcinoma; 10/12 template biopsies were positive for Gleason 3+3/3+4/4+3 with tissue involvement ranging from 10-80% PSMA/PET 04/14/2022 showed no evidence of metastatic disease   HPI: 66 y.o. male presents for follow-up visit.  He presents today with Jonathan Roberson.  No complaints today He questions regarding radical prostatectomy and radiation modalities   PMH: Past Medical History:  Diagnosis Date   Allergy    Allergy to dust    Arthritis    hands, hips, knees   Asthma    Depression    Hyperlipidemia    Hypertension    Thyroid disease     Surgical History: Past Surgical History:  Procedure Laterality Date   BIOPSY N/A 08/14/2016   Procedure: BIOPSY;  Surgeon: Lucilla Lame, MD;  Location: [place];  Service: Endoscopy;  Laterality: N/A;   COLONOSCOPY WITH PROPOFOL N/A 08/14/2016   Procedure: COLONOSCOPY WITH PROPOFOL;  Surgeon: Lucilla Lame, MD;  Location: [place];  Service: Endoscopy;  Laterality: N/A;   IMAGE GUIDED SINUS SURGERY Bilateral 09/24/2015   Procedure: IMAGE GUIDED SINUS SURGERY Bilateral nasal polypectomy,maxillary antrostomy with tissue removal,bilateral ethmoidectomy,frontal sinusotomy;  Surgeon: Clyde Canterbury, MD;  Location: [place];  Service: ENT;  Laterality: Bilateral;  GAVE DISK TO CE CE 4-12 KP   NASAL SINUS SURGERY  2005   Dr. Nadeen Landau, [facility]    Home Medications:   Allergies as of 04/30/2022       Reactions   Losartan Shortness Of Breath   Shrimp [shellfish Allergy] Shortness Of Breath   When eaten in large amounts   Monosodium Glutamate    headaches   Amoxicillin Rash   Erythromycin Rash   Penicillins Rash        Medication List        Accurate as of April 30, 2022  5:17 PM. If you have any questions, ask your nurse or doctor.          albuterol (2.5 MG/3ML) 0.083% nebulizer solution Commonly known as: PROVENTIL USE 1 VIAL (3 ML) (2.5 MG TOTAL) VIA NEBULIZER EVERY 6 HOURS AS NEEDED FOR WHEEZING OR SHORTNESS OF BREATH   albuterol 108 (90 Base) MCG/ACT inhaler Commonly known as: VENTOLIN HFA Inhale 2 puffs into the lungs every 6 (six) hours as needed for wheezing or shortness of breath.   amLODipine 5 MG tablet Commonly known as: NORVASC Take 1 tablet (5 mg total) by mouth in the morning and at bedtime.   Aspirin Buf(CaCarb-MgCarb-MgO) 81 MG Tabs Take by mouth.   b complex vitamins tablet Take 1 tablet by mouth daily.   fluticasone 50 MCG/ACT nasal spray Commonly known as: FLONASE Place 2 sprays into both nostrils daily.   fluticasone-salmeterol 100-50 MCG/ACT Aepb Commonly known as: ADVAIR Inhale 1 puff into the lungs 2 (two) times daily.   GLUCOSAMINE CHONDR COMPLEX PO Take by mouth.   loratadine 10 MG tablet Commonly known as: CLARITIN Take 10 mg by mouth daily  as needed for allergies.   NON FORMULARY Allergy injections weekly   PARoxetine 20 MG tablet Commonly known as: PAXIL Take 1 tablet (20 mg total) by mouth daily.   VITAMIN C PO Take by mouth.   VITAMIN D (ERGOCALCIFEROL) PO Take 1 tablet by mouth daily.        Allergies:  Allergies  Allergen Reactions   Losartan Shortness Of Breath   Shrimp [Shellfish Allergy] Shortness Of Breath    When eaten in large amounts   Monosodium Glutamate     headaches   Amoxicillin Rash   Erythromycin Rash   Penicillins Rash    Family  History: Family History  Problem Relation Age of Onset   Heart disease Mother    Depression Mother    Arthritis Sister    Emphysema Father    Depression Daughter     Social History:  reports that he has never smoked. He has never used smokeless tobacco. He reports that he does not currently use alcohol. He reports that he does not use drugs.   Physical Exam: BP (!) 149/96   Pulse 79   Ht '6\' 2"'$  (1.88 m)   Wt 185 lb (83.9 kg)   BMI 23.75 kg/m   Constitutional:  Alert and oriented, No acute distress.   Assessment & Plan:   He had questions regarding RALP.  He is aware I do not perform the procedure but we discussed it is typically an overnight stay with a indwelling Foley catheter x 7 days.  The most common side effects were discussed including ED and less common urinary incontinence rare side effects of rectal injury were reviewed as well as bleeding and infection He would be primarily interested in brachytherapy and was interested in a radiation oncology referral for more information He has changed to a radical new diet after doing some research.  Jonathan Roberson.  If this diet could slow the growth of prostate cancer.  I informed her I was not aware of any articles in the literature showing prostate cancer growth slowing with diet. Radiation oncology referral was placed. If he desires more information on RALP will schedule an appointment with one of my partners.  I spent 30 total minutes on the day of the encounter including pre-visit review of the medical record, face-to-face time with the patient, and post visit ordering of labs/imaging/tests.    Abbie Sons, [place] [address], [place], [place] [address] [phone]

## 2022-05-04 ENCOUNTER — Encounter
Admission: RE | Admit: 2022-05-04 | Discharge: 2022-05-04 | Disposition: A | Discharge status: Home / Self Care | Payer: Medicare HMO | Source: Ambulatory Visit | Attending: "Endocrinology | Admitting: "Endocrinology

## 2022-05-04 ENCOUNTER — Other Ambulatory Visit: Payer: Self-pay

## 2022-05-04 DIAGNOSIS — R946 Abnormal results of thyroid function studies: Secondary | ICD-10-CM | POA: Insufficient documentation

## 2022-05-04 MED ORDER — SODIUM IODIDE I-123 7.4 MBQ CAPS
335.0000 | ORAL_CAPSULE | Freq: Once | ORAL | Status: AC
  Administered 2022-05-04: 335 via ORAL

## 2022-05-04 MED ORDER — FLUTICASONE-SALMETEROL 100-50 MCG/ACT IN AEPB: 1.0000 | INHALATION_SPRAY | Freq: Two times a day (BID) | RESPIRATORY_TRACT | 3 refills | Status: DC

## 2022-05-04 NOTE — Telephone Encounter (Signed)
Medication refill for Advair 100-50 last ov 01/28/22, upcoming ov 07/30/22 . Please advise

## 2022-05-05 ENCOUNTER — Telehealth: Payer: Self-pay

## 2022-05-05 ENCOUNTER — Encounter
Admission: RE | Admit: 2022-05-05 | Discharge: 2022-05-05 | Disposition: A | Discharge status: Home / Self Care | Payer: Medicare HMO | Source: Ambulatory Visit | Attending: "Endocrinology | Admitting: "Endocrinology

## 2022-05-05 ENCOUNTER — Other Ambulatory Visit: Payer: Self-pay | Admitting: Nurse Practitioner

## 2022-05-05 DIAGNOSIS — R946 Abnormal results of thyroid function studies: Secondary | ICD-10-CM | POA: Diagnosis not present

## 2022-05-05 DIAGNOSIS — J301 Allergic rhinitis due to pollen: Secondary | ICD-10-CM | POA: Diagnosis not present

## 2022-05-05 NOTE — Telephone Encounter (Signed)
PA started for Advair 100-50 through [employer]. Awaiting on determination

## 2022-05-05 NOTE — Telephone Encounter (Signed)
Requested Prescriptions  Pending Prescriptions Disp Refills   fluticasone (FLONASE) 50 MCG/ACT nasal spray [Pharmacy Med Name: FLUTICASONE PROPIONATE 50 MCG/ACT N] 48 g 0    Sig: PLACE 2 SPRAYS INTO BOTH NOSTRILS ONCE DAILY     Ear, Nose, and Throat: Nasal Preparations - Corticosteroids Passed - 05/05/2022  1:09 PM      Passed - Valid encounter within last 12 months    Recent Outpatient Visits           3 months ago Recurrent major depressive disorder, in full remission ([place])   [place], Jolene T, NP   8 months ago Moderate persistent asthma without complication   [place] Vigg, [name], MD   1 year ago Recurrent major depressive disorder, in full remission ([place])   [place], [place] T, NP   1 year ago Encounter for annual physical exam   [place] Joy, [name], NP   1 year ago Upper respiratory tract infection, unspecified type   [place], [name], NP       Future Appointments             In 2 months [name], [name], NP [employer]

## 2022-05-08 ENCOUNTER — Ambulatory Visit: Payer: Medicare HMO | Admitting: "Endocrinology

## 2022-05-08 ENCOUNTER — Encounter: Payer: Self-pay | Admitting: "Endocrinology

## 2022-05-08 VITALS — BP 138/80 | HR 56 | Ht 74.0 in | Wt 179.2 lb

## 2022-05-08 DIAGNOSIS — E782 Mixed hyperlipidemia: Secondary | ICD-10-CM | POA: Diagnosis not present

## 2022-05-08 DIAGNOSIS — R946 Abnormal results of thyroid function studies: Secondary | ICD-10-CM

## 2022-05-08 NOTE — Progress Notes (Signed)
05/08/2022, 12:12 PM  Endocrinology follow-up note   Subjective:    Patient ID: Jonathan Phenix., male    DOB: [birth year], PCP [name], NP   Past Medical History:  Diagnosis Date   Allergy    Allergy to dust    Arthritis    hands, hips, knees   Asthma    Depression    Hyperlipidemia    Hypertension    Thyroid disease    Past Surgical History:  Procedure Laterality Date   BIOPSY N/A 08/14/2016   Procedure: BIOPSY;  Surgeon: Lucilla Lame, MD;  Location: [place];  Service: Endoscopy;  Laterality: N/A;   COLONOSCOPY WITH PROPOFOL N/A 08/14/2016   Procedure: COLONOSCOPY WITH PROPOFOL;  Surgeon: Lucilla Lame, MD;  Location: [place];  Service: Endoscopy;  Laterality: N/A;   IMAGE GUIDED SINUS SURGERY Bilateral 09/24/2015   Procedure: IMAGE GUIDED SINUS SURGERY Bilateral nasal polypectomy,maxillary antrostomy with tissue removal,bilateral ethmoidectomy,frontal sinusotomy;  Surgeon: Clyde Canterbury, MD;  Location: [place];  Service: ENT;  Laterality: Bilateral;  GAVE DISK TO CE CE 4-12 KP   NASAL SINUS SURGERY  2005   Dr. Nadeen Landau, [facility]   Social History   Socioeconomic History   Marital status: Married    Spouse name: Not on file   Number of children: Not on file   Years of education: Not on file   Highest education level: Not on file  Occupational History   Not on file  Tobacco Use   Smoking status: Never   Smokeless tobacco: Never  Vaping Use   Vaping Use: Never used  Substance and Sexual Activity   Alcohol use: Not Currently    Alcohol/week: 0.0 standard drinks of alcohol    Comment: on occasion- about once a year   Drug use: No   Sexual activity: Yes  Other Topics Concern   Not on file  Social History Narrative   Not on file   Social Determinants of Health   Financial Resource Strain: Not on file  Food Insecurity: Not on file  Transportation Needs: Not on file   Physical Activity: Not on file  Stress: Not on file  Social Connections: Not on file   Family History  Problem Relation Age of Onset   Heart disease Mother    Depression Mother    Arthritis Sister    Emphysema Father    Depression Daughter    Outpatient Encounter Medications as of 05/08/2022  Medication Sig   albuterol (PROVENTIL) (2.5 MG/3ML) 0.083% nebulizer solution USE 1 VIAL (3 ML) (2.5 MG TOTAL) VIA NEBULIZER EVERY 6 HOURS AS NEEDED FOR WHEEZING OR SHORTNESS OF BREATH   albuterol (VENTOLIN HFA) 108 (90 Base) MCG/ACT inhaler Inhale 2 puffs into the lungs every 6 (six) hours as needed for wheezing or shortness of breath.   amLODipine (NORVASC) 5 MG tablet Take 1 tablet (5 mg total) by mouth in the morning and at bedtime.   Ascorbic Acid (VITAMIN C PO) Take by mouth.   Aspirin Buf,CaCarb-MgCarb-MgO, 81 MG TABS Take by mouth.   b complex vitamins tablet Take 1 tablet by mouth daily.   fluticasone (FLONASE) 50 MCG/ACT nasal spray PLACE 2 SPRAYS INTO BOTH NOSTRILS  ONCE DAILY   fluticasone-salmeterol (ADVAIR) 100-50 MCG/ACT AEPB Inhale 1 puff into the lungs 2 (two) times daily.   Glucosamine-Chondroitin (GLUCOSAMINE CHONDR COMPLEX PO) Take by mouth.   loratadine (CLARITIN) 10 MG tablet Take 10 mg by mouth daily as needed for allergies.   NON FORMULARY Allergy injections weekly   PARoxetine (PAXIL) 20 MG tablet Take 1 tablet (20 mg total) by mouth daily.   VITAMIN D, ERGOCALCIFEROL, PO Take 1 tablet by mouth daily.   No facility-administered encounter medications on file as of 05/08/2022.   ALLERGIES: Allergies  Allergen Reactions   Losartan Shortness Of Breath   Shrimp [Shellfish Allergy] Shortness Of Breath    When eaten in large amounts   Monosodium Glutamate     headaches   Amoxicillin Rash   Erythromycin Rash   Penicillins Rash    VACCINATION STATUS: Immunization History  Administered Date(s) Administered   Influenza,inj,Quad PF,6+ Mos 05/11/2018    Influenza-Unspecified 03/02/2016, 03/22/2017, 03/16/2019   PFIZER(Purple Top)SARS-COV-2 Vaccination 08/11/2019, 09/01/2019   PNEUMOCOCCAL CONJUGATE-20 01/28/2022   Pneumococcal Polysaccharide-23 04/18/2014   Tdap 04/07/2013    HPI Jonathan Akel. is 66 y.o. male who presents today with a medical history as above. he is being seen in follow-up today he was seen in consultation for hyperthyroidism requested by [name], NP.  See notes from his first visit 2 weeks ago.  He has no new complaints today.  He is status post thyroid uptake and scan which is unremarkable.  He has made significant changes in his lifestyle which resulted in his feeling better generally. He denies palpitations, heat intolerance.  He was found to have suppressed TSH during 3 different measurements from January 2022. His measurements included T4 normal T3. Denies dysphagia, shortness of breath, nor voice change. He underwent thyroid ultrasound on March 02, 2022 which showed normal thyroid ultrasound. Specifically, no evidence of thyromegaly, atrophy or discrete thyroid nodule or mass. Thyroid uptake and scan was normal. He is not on antithyroid medication at this time.  He has family history of hypothyroidism in his sister.  No family history of thyroid malignancy. His other medical problems include hyperlipidemia and hypertension.  He is not on treatment for hyperlipidemia. He is a non-smoker, uses alcohol socially.  He is not following any particular diet program.    Review of Systems  Constitutional: + Fluctuating body weight,  +fatigue, no subjective hyperthermia, no subjective hypothermia Eyes: no blurry vision, no xerophthalmia   Objective:       05/08/2022   10:02 AM 04/30/2022   11:14 AM 04/15/2022   10:17 AM  Vitals with BMI  Height '6\' 2"'$  '6\' 2"'$  '6\' 1"'$   Weight 179 lbs 3 oz 185 lbs 183 lbs  BMI 23 20.35 59.74  Systolic 163 845 364  Diastolic 80 96 86  Pulse 56 79 60    BP 138/80    Pulse (!) 56   Ht '6\' 2"'$  (1.88 m)   Wt 179 lb 3.2 oz (81.3 kg)   BMI 23.01 kg/m   Wt Readings from Last 3 Encounters:  05/08/22 179 lb 3.2 oz (81.3 kg)  04/30/22 185 lb (83.9 kg)  04/15/22 183 lb (83 kg)    Physical Exam  Constitutional:  Body mass index is 23.01 kg/m.,  not in acute distress, normal state of mind Eyes: PERRLA, EOMI, no exophthalmos ENT: moist mucous membranes, no gross thyromegaly, no gross cervical lymphadenopathy Cardiovascular: normal precordial activity, Regular Rate and Rhythm, no Murmur/Rubs/Gallops   CMP (  most recent) CMP     Component Value Date/Time   NA 133 (L) 01/28/2022 1156   K 3.8 01/28/2022 1156   CL 95 (L) 01/28/2022 1156   CO2 28 01/28/2022 1156   GLUCOSE 87 01/28/2022 1156   BUN 9 01/28/2022 1156   CREATININE 0.73 (L) 01/28/2022 1156   CALCIUM 9.1 01/28/2022 1156   PROT 7.4 01/28/2022 1156   ALBUMIN 4.4 01/28/2022 1156   AST 20 01/28/2022 1156   ALT 20 01/28/2022 1156   ALKPHOS 69 01/28/2022 1156   BILITOT 1.2 01/28/2022 1156   GFRNONAA 93 06/24/2020 1555   GFRAA 108 06/24/2020 1555       Lipid Panel ( most recent) Lipid Panel     Component Value Date/Time   CHOL 232 (H) 01/28/2022 1156   TRIG 70 01/28/2022 1156   HDL 92 01/28/2022 1156   LDLCALC 128 (H) 01/28/2022 1156   LABVLDL 12 01/28/2022 1156      Lab Results  Component Value Date   TSH 0.179 (L) 01/28/2022   TSH 0.173 (L) 12/23/2020   TSH 0.021 (L) 06/24/2020   TSH 0.675 06/19/2019   TSH 0.947 06/29/2017   TSH 0.686 06/24/2016   TSH 1.020 04/29/2015   FREET4 1.40 12/23/2020    Reviewed ultrasound on March 02, 2022: Total right and left lobes measure 5.1 cm in greatest dimension. Normal thyroid ultrasound. Specifically, no evidence of thyromegaly, atrophy or discrete thyroid nodule or mass.  Thyroid uptake and scan on May 05, 2022  FINDINGS: Homogeneous tracer distribution in both thyroid lobes.   No focal areas of increased or decreased tracer  localization.   4 hour I-123 uptake = 8.7% (normal 5-20%),  24 hour I-123 uptake = 21.9% (normal 10-30%)   IMPRESSION: Normal exam.  Assessment & Plan:   1. Abnormal thyroid function test 2. hyperlipidemia  Jonathan Roberson presents with normal thyroid uptake and scan.  He will not need definitive antithyroid intervention at this time.  However, he will need full set thyroid function tests in the next several days.  In light of his hyperlipidemia, and the fact that he has changed his diet, he will also have fasting lipid panel along with his labs.  Thyroid ultrasound is unremarkable, will not need intervention for now. His LDL was 126 putting him at risk for cardiovascular diseases.  If his next labs show LDL greater than 100, he should be considered for intervention.  He is encouraged to stay engaged in whole food plant-based diet and other lifestyle medicine pillars discussed.  - he is advised to maintain close follow up with [name], NP for primary care needs.   I spent 21 minutes in the care of the patient today including review of labs from Thyroid Function, CMP, and other relevant labs ; imaging/biopsy records (current and previous including abstractions from other facilities); face-to-face time discussing  his lab results and symptoms, medications doses, his options of short and long term treatment based on the latest standards of care / guidelines;   and documenting the encounter.  Jonathan Phenix.  participated in the discussions, expressed understanding, and voiced agreement with the above plans.  All questions were answered to his satisfaction. he is encouraged to contact clinic should he have any questions or concerns prior to his return visit.   Follow up plan: Return in about 10 days (around 05/18/2022), or Virtual, for Fasting Labs  in AM B4 8.   Glade Lloyd, MD [facility] [address] [place], [place]  [address] Phone: [phone]  Fax: [phone]     05/08/2022, 12:12 PM  This note was partially dictated with voice recognition software. Similar sounding words can be transcribed inadequately or may not  be corrected upon review.

## 2022-05-11 DIAGNOSIS — R946 Abnormal results of thyroid function studies: Secondary | ICD-10-CM | POA: Diagnosis not present

## 2022-05-11 DIAGNOSIS — E782 Mixed hyperlipidemia: Secondary | ICD-10-CM | POA: Diagnosis not present

## 2022-05-12 DIAGNOSIS — J301 Allergic rhinitis due to pollen: Secondary | ICD-10-CM | POA: Diagnosis not present

## 2022-05-13 ENCOUNTER — Ambulatory Visit
Admission: RE | Admit: 2022-05-13 | Discharge: 2022-05-13 | Disposition: A | Discharge status: Home / Self Care | Payer: Medicare HMO | Source: Ambulatory Visit | Attending: Radiation Oncology | Admitting: Radiation Oncology

## 2022-05-13 ENCOUNTER — Encounter: Payer: Self-pay | Admitting: Radiation Oncology

## 2022-05-13 VITALS — BP 145/93 | HR 84 | Temp 97.5°F | Resp 12 | Ht 73.0 in | Wt 182.0 lb

## 2022-05-13 DIAGNOSIS — Z7951 Long term (current) use of inhaled steroids: Secondary | ICD-10-CM | POA: Insufficient documentation

## 2022-05-13 DIAGNOSIS — I10 Essential (primary) hypertension: Secondary | ICD-10-CM | POA: Diagnosis not present

## 2022-05-13 DIAGNOSIS — E785 Hyperlipidemia, unspecified: Secondary | ICD-10-CM | POA: Insufficient documentation

## 2022-05-13 DIAGNOSIS — J45909 Unspecified asthma, uncomplicated: Secondary | ICD-10-CM | POA: Diagnosis not present

## 2022-05-13 DIAGNOSIS — C61 Malignant neoplasm of prostate: Secondary | ICD-10-CM | POA: Insufficient documentation

## 2022-05-13 DIAGNOSIS — E079 Disorder of thyroid, unspecified: Secondary | ICD-10-CM | POA: Diagnosis not present

## 2022-05-13 DIAGNOSIS — Z79899 Other long term (current) drug therapy: Secondary | ICD-10-CM | POA: Insufficient documentation

## 2022-05-13 DIAGNOSIS — Z191 Hormone sensitive malignancy status: Secondary | ICD-10-CM | POA: Diagnosis not present

## 2022-05-13 LAB — LIPID PANEL
Chol/HDL Ratio: 2.1 ratio (ref 0.0–5.0)
Cholesterol, Total: 152 mg/dL (ref 100–199)
HDL: 71 mg/dL (ref >39)
LDL Chol Calc (NIH): 71 mg/dL (ref 0–99)
Triglycerides: 44 mg/dL (ref 0–149)
VLDL Cholesterol Cal: 10 mg/dL (ref 5–40)

## 2022-05-13 LAB — THYROGLOBULIN ANTIBODY: Thyroglobulin Antibody: <1 IU/mL (ref 0.0–0.9)

## 2022-05-13 LAB — T4, FREE: Free T4: 1.78 ng/dL — ABNORMAL HIGH (ref 0.82–1.77)

## 2022-05-13 LAB — T3, FREE: T3, Free: 4.6 pg/mL — ABNORMAL HIGH (ref 2.0–4.4)

## 2022-05-13 LAB — THYROID PEROXIDASE ANTIBODY: Thyroperoxidase Ab SerPl-aCnc: 47 IU/mL — ABNORMAL HIGH (ref 0–34)

## 2022-05-13 LAB — TSH: TSH: <0.005 u[IU]/mL — ABNORMAL LOW (ref 0.450–4.500)

## 2022-05-13 NOTE — Consult Note (Signed)
NEW PATIENT EVALUATION  Name: Jonathan Roberson.  MRN: [id number]  Date:   05/13/2022     DOB: 1956-03-20   This 66 y.o. 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  Of 6  REFERRING PHYSICIAN: [name], NP  CHIEF COMPLAINT:  Chief Complaint  Patient presents with   Prostate Cancer    DIAGNOSIS: The encounter diagnosis was Malignant neoplasm of prostate ([place]).   PREVIOUS INVESTIGATIONS:  Pathology reports reviewed PSMA PET scan and MRI scan reviewed Clinical notes reviewed  HPI: Patient is a 66 year old male who presents with an elevated PSA in the 6 range.  On digital rectal exam he had a firm prostate bilaterally.  He had an MRI scan of his prostate showing 2 category 5 PI-RADS lesions in the left peripheral and transitional zone.  He had a fusion biopsy which showed 13 out of 15 core biopsies positive for mixture of Gleason 7 (3+4) 4+3) and Gleason 6 adenocarcinoma.PSMA PET scan was performed showing only radiotracer uptake in the left anterior prostate consistent with known prostate cancer no evidence of pelvic adenopathy or local regional bone metastasis.  He has had discussions with urology and is interested in radiation options.  He specifically denies increased lower urinary tract symptoms any GI problems or bone pain.  PLANNED TREATMENT REGIMEN: I-125 interstitial implant plus ADT therapy  PAST MEDICAL HISTORY:  has a past medical history of Allergy, Allergy to dust, Arthritis, Asthma, Depression, Hyperlipidemia, Hypertension, and Thyroid disease.    PAST SURGICAL HISTORY:  Past Surgical History:  Procedure Laterality Date   BIOPSY N/A 08/14/2016   Procedure: BIOPSY;  Surgeon: Lucilla Lame, MD;  Location: [place];  Service: Endoscopy;  Laterality: N/A;   COLONOSCOPY WITH PROPOFOL N/A 08/14/2016   Procedure: COLONOSCOPY WITH PROPOFOL;  Surgeon: Lucilla Lame, MD;  Location: [place];  Service: Endoscopy;  Laterality: N/A;   IMAGE GUIDED SINUS SURGERY Bilateral 09/24/2015   Procedure: IMAGE GUIDED SINUS SURGERY Bilateral nasal polypectomy,maxillary antrostomy with tissue removal,bilateral ethmoidectomy,frontal sinusotomy;  Surgeon: Clyde Canterbury, MD;  Location: [place];  Service: ENT;  Laterality: Bilateral;  GAVE DISK TO CE CE 4-12 KP   NASAL SINUS SURGERY  2005   Dr. Nadeen Landau, [facility]    FAMILY HISTORY: family history includes Arthritis in his sister; Depression in his daughter and mother; Emphysema in his father; Heart disease in his mother.  SOCIAL HISTORY:  reports that he has never smoked. He has never used smokeless tobacco. He reports that he does not currently use alcohol. He reports that he does not use drugs.  ALLERGIES: Losartan, Shrimp [shellfish allergy], Monosodium glutamate, Amoxicillin, Erythromycin, and Penicillins  MEDICATIONS:  Current Outpatient Medications  Medication Sig Dispense Refill   albuterol (PROVENTIL) (2.5 MG/3ML) 0.083% nebulizer solution USE 1 VIAL (3 ML) (2.5 MG TOTAL) VIA NEBULIZER EVERY 6 HOURS AS NEEDED FOR WHEEZING OR SHORTNESS OF BREATH 150 mL 20   albuterol (VENTOLIN HFA) 108 (90 Base) MCG/ACT inhaler Inhale 2 puffs into the lungs every 6 (six) hours as needed for wheezing or shortness of breath. 18 g 3   amLODipine (NORVASC) 5 MG tablet Take 1 tablet (5 mg total) by mouth in the morning and at bedtime. 180 tablet 4   Aspirin Buf,CaCarb-MgCarb-MgO, 81 MG TABS Take by mouth.     b complex vitamins tablet Take 1 tablet by mouth daily.  fluticasone (FLONASE) 50 MCG/ACT nasal spray PLACE 2 SPRAYS INTO BOTH NOSTRILS ONCE DAILY 48 g 1   fluticasone-salmeterol (ADVAIR) 100-50 MCG/ACT AEPB Inhale 1 puff into the lungs 2 (two) times daily. 1 each 3   Glucosamine-Chondroitin (GLUCOSAMINE CHONDR COMPLEX PO) Take by mouth.     loratadine (CLARITIN) 10 MG tablet Take 10 mg by mouth daily  as needed for allergies.     NON FORMULARY Allergy injections weekly     PARoxetine (PAXIL) 20 MG tablet Take 1 tablet (20 mg total) by mouth daily. 90 tablet 4   VITAMIN D, ERGOCALCIFEROL, PO Take 1 tablet by mouth daily.     Ascorbic Acid (VITAMIN C PO) Take by mouth. (Patient not taking: Reported on 05/13/2022)     No current facility-administered medications for this encounter.    ECOG PERFORMANCE STATUS:  0 - Asymptomatic  REVIEW OF SYSTEMS: Patient denies any weight loss, fatigue, weakness, fever, chills or night sweats. Patient denies any loss of vision, blurred vision. Patient denies any ringing  of the ears or hearing loss. No irregular heartbeat. Patient denies heart murmur or history of fainting. Patient denies any chest pain or pain radiating to her upper extremities. Patient denies any shortness of breath, difficulty breathing at night, cough or hemoptysis. Patient denies any swelling in the lower legs. Patient denies any nausea vomiting, vomiting of blood, or coffee ground material in the vomitus. Patient denies any stomach pain. Patient states has had normal bowel movements no significant constipation or diarrhea. Patient denies any dysuria, hematuria or significant nocturia. Patient denies any problems walking, swelling in the joints or loss of balance. Patient denies any skin changes, loss of hair or loss of weight. Patient denies any excessive worrying or anxiety or significant depression. Patient denies any problems with insomnia. Patient denies excessive thirst, polyuria, polydipsia. Patient denies any swollen glands, patient denies easy bruising or easy bleeding. Patient denies any recent infections, allergies or URI. Patient "s visual fields have not changed significantly in recent time.   PHYSICAL EXAM: BP (!) 145/93 (BP Location: Left Arm, Patient Position: Sitting, Cuff Size: Normal)   Pulse 84   Temp (!) 97.5 F (36.4 C) (Tympanic)   Resp 12   Ht '6\' 1"'$  (1.854 m)   Wt  182 lb (82.6 kg)   BMI 24.01 kg/m  Well-developed well-nourished patient in NAD. HEENT reveals PERLA, EOMI, discs not visualized.  Oral cavity is clear. No oral mucosal lesions are identified. Neck is clear without evidence of cervical or supraclavicular adenopathy. Lungs are clear to A&P. Cardiac examination is essentially unremarkable with regular rate and rhythm without murmur rub or thrill. Abdomen is benign with no organomegaly or masses noted. Motor sensory and DTR levels are equal and symmetric in the upper and lower extremities. Cranial nerves II through XII are grossly intact. Proprioception is intact. No peripheral adenopathy or edema is identified. No motor or sensory levels are noted. Crude visual fields are within normal range.  LABORATORY DATA: Pathology reports reviewed    RADIOLOGY RESULTS: PSMA PET scan and MRI scan of his prostate reviewed compatible with above-stated findings   IMPRESSION: Stage IIb Gleason 7 adenocarcinoma the prostate in 67 year old male  PLAN: I again went over treatment options including robotic assisted prostatectomy external beam radiation therapy and implant.  He is favoring going ahead with I-125 interstitial implant.  Risks and benefits of treatment including increased lower urinary tract symptoms possible diarrhea fatigue risks of general anesthesia as well as radiation safety precautions  all were reviewed with the patient and his wife.  I would also like the patient have a 37-monthADT treatment with Eligard and have asked Dr. SDene Gentryoffice to provide that.  We will go ahead and schedule his volume study.  Patient comprehends my recommendations well.  I would like to take this opportunity to thank you for allowing me to participate in the care of your patient..Noreene Filbert MD

## 2022-05-15 ENCOUNTER — Other Ambulatory Visit: Payer: Self-pay

## 2022-05-15 DIAGNOSIS — C61 Malignant neoplasm of prostate: Secondary | ICD-10-CM

## 2022-05-15 NOTE — Progress Notes (Signed)
Brachytherapy Standing Order Form:  Part 1 Volume Study:  Date: 06/10/2022  CPT: 65784, 69629 Procedure: Prostate Volume Study  Part 2 Seed Implant:   Surgeon: John Giovanni, MD  Date: 07/07/2022    CPT code: 52841, 32440, [phone] Procedure: Brachytherapy Radioactive seed implant  Anesthesia: General  VTE: SCD's  Standing Orders:  -Ancef 2G IV,  -Fleets 2 hr prior, -NPO after midnight,  -UA & Culture

## 2022-05-19 ENCOUNTER — Encounter: Payer: Self-pay | Admitting: "Endocrinology

## 2022-05-19 ENCOUNTER — Ambulatory Visit (INDEPENDENT_AMBULATORY_CARE_PROVIDER_SITE_OTHER): Payer: Medicare HMO | Admitting: "Endocrinology

## 2022-05-19 VITALS — BP 145/90 | Ht 74.0 in | Wt 178.0 lb

## 2022-05-19 DIAGNOSIS — I10 Essential (primary) hypertension: Secondary | ICD-10-CM

## 2022-05-19 DIAGNOSIS — E0590 Thyrotoxicosis, unspecified without thyrotoxic crisis or storm: Secondary | ICD-10-CM

## 2022-05-19 DIAGNOSIS — R768 Other specified abnormal immunological findings in serum: Secondary | ICD-10-CM | POA: Diagnosis not present

## 2022-05-19 DIAGNOSIS — E782 Mixed hyperlipidemia: Secondary | ICD-10-CM

## 2022-05-19 DIAGNOSIS — J301 Allergic rhinitis due to pollen: Secondary | ICD-10-CM | POA: Diagnosis not present

## 2022-05-19 MED ORDER — LISINOPRIL 10 MG PO TABS: 10.0000 mg | ORAL_TABLET | Freq: Every day | ORAL | 1 refills | Status: DC

## 2022-05-19 MED ORDER — METHIMAZOLE 5 MG PO TABS: 5.0000 mg | ORAL_TABLET | Freq: Every day | ORAL | 1 refills | Status: DC

## 2022-05-19 NOTE — Progress Notes (Signed)
05/19/2022, 8:07 PM  Endocrinology follow-up note                                Endocrinology Telehealth Visit Follow up Note   This visit type was conducted  via telephone. I connected with Jonathan Phenix. on 05/19/2022   by telephone and verified that I am speaking with the correct person using two identifiers. Jonathan Phenix., 1955/08/23. he has verbally consented to this visit.  I, Glade Lloyd , MD was in my office and patient , Jonathan Roberson. , was in his residence. No other persons were with me during the encounter. All issues noted in this document were discussed and addressed. The format was not optimal for physical exam.   Subjective:    Patient ID: Jonathan Phenix., male    DOB: 1956/02/28, PCP [name], NP   Past Medical History:  Diagnosis Date   Allergy    Allergy to dust    Arthritis    hands, hips, knees   Asthma    Depression    Hyperlipidemia    Hypertension    Thyroid disease    Past Surgical History:  Procedure Laterality Date   BIOPSY N/A 08/14/2016   Procedure: BIOPSY;  Surgeon: Lucilla Lame, MD;  Location: [place];  Service: Endoscopy;  Laterality: N/A;   COLONOSCOPY WITH PROPOFOL N/A 08/14/2016   Procedure: COLONOSCOPY WITH PROPOFOL;  Surgeon: Lucilla Lame, MD;  Location: [place];  Service: Endoscopy;  Laterality: N/A;   IMAGE GUIDED SINUS SURGERY Bilateral 09/24/2015   Procedure: IMAGE GUIDED SINUS SURGERY Bilateral nasal polypectomy,maxillary antrostomy with tissue removal,bilateral ethmoidectomy,frontal sinusotomy;  Surgeon: Clyde Canterbury, MD;  Location: [place];  Service: ENT;  Laterality: Bilateral;  GAVE DISK TO CE CE 4-12 KP   NASAL SINUS SURGERY  2005   Dr. Nadeen Landau, [facility]   Social History   Socioeconomic History   Marital status: Married    Spouse name: Not on file   Number of children: Not on file   Years of education: Not on file    Highest education level: Not on file  Occupational History   Not on file  Tobacco Use   Smoking status: Never   Smokeless tobacco: Never  Vaping Use   Vaping Use: Never used  Substance and Sexual Activity   Alcohol use: Not Currently    Alcohol/week: 0.0 standard drinks of alcohol    Comment: on occasion- about once a year   Drug use: No   Sexual activity: Yes  Other Topics Concern   Not on file  Social History Narrative   Not on file   Social Determinants of Health   Financial Resource Strain: Not on file  Food Insecurity: Not on file  Transportation Needs: Not on file  Physical Activity: Not on file  Stress: Not on file  Social Connections: Not on file   Family History  Problem Relation Age of Onset   Heart disease Mother    Depression Mother    Arthritis Sister    Emphysema Father    Depression Daughter  Outpatient Encounter Medications as of 05/19/2022  Medication Sig   lisinopril (ZESTRIL) 10 MG tablet Take 1 tablet (10 mg total) by mouth daily.   methimazole (TAPAZOLE) 5 MG tablet Take 1 tablet (5 mg total) by mouth daily.   Turmeric (QC TUMERIC COMPLEX PO) Take by mouth daily.   albuterol (PROVENTIL) (2.5 MG/3ML) 0.083% nebulizer solution USE 1 VIAL (3 ML) (2.5 MG TOTAL) VIA NEBULIZER EVERY 6 HOURS AS NEEDED FOR WHEEZING OR SHORTNESS OF BREATH   albuterol (VENTOLIN HFA) 108 (90 Base) MCG/ACT inhaler Inhale 2 puffs into the lungs every 6 (six) hours as needed for wheezing or shortness of breath.   Aspirin Buf,CaCarb-MgCarb-MgO, 81 MG TABS Take by mouth.   b complex vitamins tablet Take 1 tablet by mouth daily. (Patient not taking: Reported on 05/19/2022)   fluticasone (FLONASE) 50 MCG/ACT nasal spray PLACE 2 SPRAYS INTO BOTH NOSTRILS ONCE DAILY   fluticasone-salmeterol (ADVAIR) 100-50 MCG/ACT AEPB Inhale 1 puff into the lungs 2 (two) times daily.   Glucosamine-Chondroitin (GLUCOSAMINE CHONDR COMPLEX PO) Take by mouth. (Patient not taking: Reported on  05/19/2022)   loratadine (CLARITIN) 10 MG tablet Take 10 mg by mouth daily as needed for allergies.   NON FORMULARY Allergy injections weekly   PARoxetine (PAXIL) 20 MG tablet Take 1 tablet (20 mg total) by mouth daily.   VITAMIN D, ERGOCALCIFEROL, PO Take 1 tablet by mouth daily.   [DISCONTINUED] amLODipine (NORVASC) 5 MG tablet Take 1 tablet (5 mg total) by mouth in the morning and at bedtime.   [DISCONTINUED] Ascorbic Acid (VITAMIN C PO) Take by mouth. (Patient not taking: Reported on 05/13/2022)   No facility-administered encounter medications on file as of 05/19/2022.   ALLERGIES: Allergies  Allergen Reactions   Losartan Shortness Of Breath   Shrimp [Shellfish Allergy] Shortness Of Breath    When eaten in large amounts   Monosodium Glutamate     headaches   Amoxicillin Rash   Erythromycin Rash   Penicillins Rash    VACCINATION STATUS: Immunization History  Administered Date(s) Administered   Influenza,inj,Quad PF,6+ Mos 05/11/2018   Influenza-Unspecified 03/02/2016, 03/22/2017, 03/16/2019   PFIZER(Purple Top)SARS-COV-2 Vaccination 08/11/2019, 09/01/2019   PNEUMOCOCCAL CONJUGATE-20 01/28/2022   Pneumococcal Polysaccharide-23 04/18/2014   Tdap 04/07/2013    HPI Jonathan Marez. is 66 y.o. male who presents today with a medical history as above. he is being engaged in telehealth to followup with repeat TFTS and Lipid panel. He has no new complaints. He was recently  seen in consultation for hyperthyroidism requested by [name], NP.   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  He denies palpitations, heat intolerance.  He was found to have suppressed TSH during 4 different measurements from January 2022. His recent measurements included  elevated T4 normal T3. Denies dysphagia, shortness of breath, nor voice  change. He underwent thyroid ultrasound on March 02, 2022 which showed normal thyroid ultrasound. Specifically, no evidence of thyromegaly, atrophy or discrete thyroid nodule or mass.   He is not on antithyroid medication at this time.  He has family history of hypothyroidism in his sister.  No family history of thyroid malignancy. His other medical problems include hyperlipidemia and hypertension.  He is not on treatment for hyperlipidemia, his recent switch to [employer] helped him dramatically improve his lipid panel- see below. He is a non-smoker, uses alcohol socially.  His BP is not controlled on Amlodipine '5mg'$  po BID.  Review of Systems    Objective:       05/19/2022    1:06 PM 05/13/2022    1:43 PM 05/08/2022   10:02 AM  Vitals with BMI  Height '6\' 2"'$  '6\' 1"'$  '6\' 2"'$   Weight 178 lbs 182 lbs 179 lbs 3 oz  BMI 02.40 97.35 23  Systolic 329 924 268  Diastolic 90 93 80  Pulse  84 56    BP (!) 145/90   Ht '6\' 2"'$  (1.88 m)   Wt 178 lb (80.7 kg)   BMI 22.85 kg/m   Wt Readings from Last 3 Encounters:  05/19/22 178 lb (80.7 kg)  05/13/22 182 lb (82.6 kg)  05/08/22 179 lb 3.2 oz (81.3 kg)    Physical Exam    CMP ( most recent) CMP     Component Value Date/Time   NA 133 (L) 01/28/2022 1156   K 3.8 01/28/2022 1156   CL 95 (L) 01/28/2022 1156   CO2 28 01/28/2022 1156   GLUCOSE 87 01/28/2022 1156   BUN 9 01/28/2022 1156   CREATININE 0.73 (L) 01/28/2022 1156   CALCIUM 9.1 01/28/2022 1156   PROT 7.4 01/28/2022 1156   ALBUMIN 4.4 01/28/2022 1156   AST 20 01/28/2022 1156   ALT 20 01/28/2022 1156   ALKPHOS 69 01/28/2022 1156   BILITOT 1.2 01/28/2022 1156   GFRNONAA 93 06/24/2020 1555   GFRAA 108 06/24/2020 1555       Lipid Panel ( most recent) Lipid Panel     Component Value Date/Time   CHOL 152 05/11/2022 0941   TRIG 44 05/11/2022 0941   HDL 71 05/11/2022 0941   CHOLHDL 2.1 05/11/2022 0941   LDLCALC 71 05/11/2022 0941   LABVLDL 10 05/11/2022 0941      Lab  Results  Component Value Date   TSH <0.005 (L) 05/11/2022   TSH 0.179 (L) 01/28/2022   TSH 0.173 (L) 12/23/2020   TSH 0.021 (L) 06/24/2020   TSH 0.675 06/19/2019   TSH 0.947 06/29/2017   TSH 0.686 06/24/2016   TSH 1.020 04/29/2015   FREET4 1.78 (H) 05/11/2022   FREET4 1.40 12/23/2020    Reviewed ultrasound on March 02, 2022: Total right and left lobes measure 5.1 cm in greatest dimension. Normal thyroid ultrasound. Specifically, no evidence of thyromegaly, atrophy or discrete thyroid nodule or mass.  Thyroid uptake and scan on May 05, 2022  FINDINGS: Homogeneous tracer distribution in both thyroid lobes.   No focal areas of increased or decreased tracer localization.   4 hour I-123 uptake = 8.7% (normal 5-20%),  24 hour I-123 uptake = 21.9% (normal 10-30%)   IMPRESSION: Normal exam.   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 Hyperlipidemia 3. Hypertension  Jonathan Roberson presents with normal thyroid uptake and scan, but labs significant for hyperthyroidism. He will not need ablative therapy. He will benefit from Methimazole '5mg'$  po qdaily at breakfast with plan to repeat TFTs in 3 months. SE briefly discussed with him.   He has significantly improved his lipid panel , LDL dropping to 71 from 122. He is encouraged to continue on WFPB diet. He will not need statins.   Previous Thyroid ultrasound is unremarkable, will not need intervention for now. For better control of BP, he is advised to d/c amlodipine , I discussed and initiated Lisinopril 10 mg po q day with plan to follow up. If BP remains above 130/80  HCTZ-Lisinopril combination will be considered.  - he is advised to maintain close follow up with [name], NP for primary care needs.    I spent  24 minutes in the care of the patient today including review of labs from Thyroid Function, CMP, and other relevant labs ; imaging/biopsy records (current and previous including abstractions  from other facilities); face-to-face time discussing  his lab results and symptoms, medications doses, his options of short and long term treatment based on the latest standards of care / guidelines;   and documenting the encounter.  Jonathan Phenix.  participated in the discussions, expressed understanding, and voiced agreement with the above plans.  All questions were answered to his satisfaction. he is encouraged to contact clinic should he have any questions or concerns prior to his return visit.    Follow up plan: Return in about 3 months (around 08/18/2022) for F/U with Pre-visit Labs.   Glade Lloyd, MD [facility] [address] [place], [place] [address] Phone: [phone]  Fax: [phone]     05/19/2022, 8:07 PM  This note was partially dictated with voice recognition software. Similar sounding words can be transcribed inadequately or may not  be corrected upon review.

## 2022-05-20 ENCOUNTER — Encounter: Payer: Self-pay | Admitting: Nurse Practitioner

## 2022-05-21 ENCOUNTER — Encounter: Payer: Self-pay | Admitting: "Endocrinology

## 2022-05-21 ENCOUNTER — Encounter: Payer: Self-pay | Admitting: Urology

## 2022-05-21 NOTE — Telephone Encounter (Signed)
Patient was called and made aware. 

## 2022-05-26 DIAGNOSIS — J301 Allergic rhinitis due to pollen: Secondary | ICD-10-CM | POA: Diagnosis not present

## 2022-05-27 ENCOUNTER — Telehealth: Payer: Self-pay

## 2022-05-27 NOTE — Telephone Encounter (Signed)
-----   Message from Despina Hidden, [place] sent at 05/15/2022 10:32 AM EST ----- Regarding: FW: Eligard  ----- Message ----- From: Christean Grief, RN Sent: 05/14/2022  11:53 AM EST To: Amado Coe, CMA; Manus Rudd, RN; # Subject: RE: Eligard                                     ----- Message ----- From: Amado Coe, CMA Sent: 05/14/2022  11:33 AM EST To: Manus Rudd, RN; Christean Grief, RN; # Subject: RE: Eligard                                    Please verify patient  ----- Message ----- From: Christean Grief, RN Sent: 05/13/2022   2:19 PM EST To: Manus Rudd, RN; [facility] Subject: Eligard                                        Good afternoon,  This patient will need an Eligard injection.    Thanks, Ranelle Oyster

## 2022-05-27 NOTE — Telephone Encounter (Signed)
Eligard PA initiated via [employer] form, awaiting response.

## 2022-05-28 NOTE — Telephone Encounter (Signed)
Incoming approval of PA for [employer].   Auth #[id number]  Dates: 05/27/22 - 11/26/22.  Pt scheduled.

## 2022-06-02 DIAGNOSIS — J301 Allergic rhinitis due to pollen: Secondary | ICD-10-CM | POA: Diagnosis not present

## 2022-06-04 ENCOUNTER — Other Ambulatory Visit: Payer: Self-pay

## 2022-06-04 DIAGNOSIS — C61 Malignant neoplasm of prostate: Secondary | ICD-10-CM

## 2022-06-04 NOTE — Progress Notes (Signed)
Brachytherapy Standing Order Form:  Part 1 Volume Study:  Date: 07/22/2022  CPT: 23414, 43601 Procedure: Prostate Volume Study  Part 2 Seed Implant:   Surgeon: John Giovanni, MD  Date: 08/18/2022    CPT code: 65800, 63494, 94473 Procedure: Brachytherapy Radioactive seed implant  Anesthesia: General  VTE: SCD's  Standing Orders:  -Ancef 2G IV,  -Fleets 2 hr prior, -NPO after midnight,  -UA & Culture

## 2022-06-09 DIAGNOSIS — J301 Allergic rhinitis due to pollen: Secondary | ICD-10-CM | POA: Diagnosis not present

## 2022-06-10 ENCOUNTER — Ambulatory Visit: Admit: 2022-06-10 | Payer: Medicare HMO

## 2022-06-10 ENCOUNTER — Ambulatory Visit: Payer: Medicare HMO | Admitting: Radiation Oncology

## 2022-06-10 DIAGNOSIS — J301 Allergic rhinitis due to pollen: Secondary | ICD-10-CM | POA: Diagnosis not present

## 2022-06-10 SURGERY — ULTRASOUND, PROSTATE, FOR VOLUME DETERMINATION
Anesthesia: Choice

## 2022-06-16 ENCOUNTER — Other Ambulatory Visit: Payer: Medicare HMO

## 2022-06-16 DIAGNOSIS — J301 Allergic rhinitis due to pollen: Secondary | ICD-10-CM | POA: Diagnosis not present

## 2022-06-23 DIAGNOSIS — J301 Allergic rhinitis due to pollen: Secondary | ICD-10-CM | POA: Diagnosis not present

## 2022-06-24 ENCOUNTER — Other Ambulatory Visit: Payer: Self-pay

## 2022-06-24 DIAGNOSIS — C61 Malignant neoplasm of prostate: Secondary | ICD-10-CM

## 2022-06-24 DIAGNOSIS — R972 Elevated prostate specific antigen [PSA]: Secondary | ICD-10-CM

## 2022-06-26 ENCOUNTER — Telehealth: Payer: Self-pay | Admitting: *Deleted

## 2022-06-26 NOTE — Telephone Encounter (Signed)
[facility] calling asking for copies of the images for Pet Scan 04/14/2022 and MRI of prostate 02/18/22. I advised that pt would need to medical release form. Per [place] they have medical release. I advised them to call radiology at [facility].

## 2022-06-29 ENCOUNTER — Other Ambulatory Visit: Payer: Medicare HMO

## 2022-06-30 ENCOUNTER — Other Ambulatory Visit: Payer: Medicare HMO

## 2022-06-30 DIAGNOSIS — R972 Elevated prostate specific antigen [PSA]: Secondary | ICD-10-CM

## 2022-06-30 DIAGNOSIS — C61 Malignant neoplasm of prostate: Secondary | ICD-10-CM | POA: Diagnosis not present

## 2022-06-30 DIAGNOSIS — J301 Allergic rhinitis due to pollen: Secondary | ICD-10-CM | POA: Diagnosis not present

## 2022-07-01 LAB — PSA: Prostate Specific Ag, Serum: 4.9 ng/mL — ABNORMAL HIGH (ref 0.0–4.0)

## 2022-07-06 ENCOUNTER — Ambulatory Visit: Payer: Medicare HMO | Admitting: Physician Assistant

## 2022-07-07 ENCOUNTER — Ambulatory Visit: Admit: 2022-07-07 | Payer: Medicare HMO | Admitting: Urology

## 2022-07-07 ENCOUNTER — Encounter: Payer: Medicare HMO | Admitting: Radiation Oncology

## 2022-07-07 DIAGNOSIS — J301 Allergic rhinitis due to pollen: Secondary | ICD-10-CM | POA: Diagnosis not present

## 2022-07-07 SURGERY — INSERTION, RADIATION SOURCE, PROSTATE
Anesthesia: General

## 2022-07-08 DIAGNOSIS — C61 Malignant neoplasm of prostate: Secondary | ICD-10-CM | POA: Diagnosis not present

## 2022-07-09 ENCOUNTER — Encounter: Payer: Self-pay | Admitting: Urology

## 2022-07-14 DIAGNOSIS — J301 Allergic rhinitis due to pollen: Secondary | ICD-10-CM | POA: Diagnosis not present

## 2022-07-15 ENCOUNTER — Ambulatory Visit: Payer: Medicare HMO | Admitting: Urology

## 2022-07-17 NOTE — Telephone Encounter (Signed)
Spoke with patient and wife, they would like to speak with you about active surveillance and radical diet. They have numerous concerns and would like time to ask questions. Appt scheduled for 30 minutes. Appt canceled with Erlene Quan.

## 2022-07-21 DIAGNOSIS — J301 Allergic rhinitis due to pollen: Secondary | ICD-10-CM | POA: Diagnosis not present

## 2022-07-22 ENCOUNTER — Ambulatory Visit: Admit: 2022-07-22 | Payer: Medicare HMO

## 2022-07-22 SURGERY — ULTRASOUND, PROSTATE, FOR VOLUME DETERMINATION
Anesthesia: Choice

## 2022-07-25 ENCOUNTER — Encounter: Payer: Self-pay | Admitting: Nurse Practitioner

## 2022-07-26 ENCOUNTER — Encounter: Payer: Self-pay | Admitting: "Endocrinology

## 2022-07-26 DIAGNOSIS — E0590 Thyrotoxicosis, unspecified without thyrotoxic crisis or storm: Secondary | ICD-10-CM | POA: Insufficient documentation

## 2022-07-26 NOTE — Patient Instructions (Signed)

## 2022-07-28 DIAGNOSIS — J301 Allergic rhinitis due to pollen: Secondary | ICD-10-CM | POA: Diagnosis not present

## 2022-07-29 ENCOUNTER — Encounter: Payer: Self-pay | Admitting: Urology

## 2022-07-29 ENCOUNTER — Ambulatory Visit: Payer: Medicare HMO | Admitting: Urology

## 2022-07-30 ENCOUNTER — Ambulatory Visit (INDEPENDENT_AMBULATORY_CARE_PROVIDER_SITE_OTHER): Payer: Medicare HMO | Admitting: Nurse Practitioner

## 2022-07-30 ENCOUNTER — Encounter: Payer: Self-pay | Admitting: Nurse Practitioner

## 2022-07-30 VITALS — BP 128/86 | HR 69 | Temp 97.9°F | Ht 74.02 in | Wt 166.3 lb

## 2022-07-30 DIAGNOSIS — E0590 Thyrotoxicosis, unspecified without thyrotoxic crisis or storm: Secondary | ICD-10-CM | POA: Diagnosis not present

## 2022-07-30 DIAGNOSIS — Z0000 Encounter for general adult medical examination without abnormal findings: Secondary | ICD-10-CM

## 2022-07-30 DIAGNOSIS — I10 Essential (primary) hypertension: Secondary | ICD-10-CM | POA: Diagnosis not present

## 2022-07-30 DIAGNOSIS — E871 Hypo-osmolality and hyponatremia: Secondary | ICD-10-CM | POA: Diagnosis not present

## 2022-07-30 DIAGNOSIS — R69 Illness, unspecified: Secondary | ICD-10-CM | POA: Diagnosis not present

## 2022-07-30 DIAGNOSIS — E559 Vitamin D deficiency, unspecified: Secondary | ICD-10-CM

## 2022-07-30 DIAGNOSIS — F3342 Major depressive disorder, recurrent, in full remission: Secondary | ICD-10-CM | POA: Diagnosis not present

## 2022-07-30 DIAGNOSIS — E7800 Pure hypercholesterolemia, unspecified: Secondary | ICD-10-CM

## 2022-07-30 DIAGNOSIS — C61 Malignant neoplasm of prostate: Secondary | ICD-10-CM

## 2022-07-30 DIAGNOSIS — J4540 Moderate persistent asthma, uncomplicated: Secondary | ICD-10-CM

## 2022-07-30 NOTE — Assessment & Plan Note (Signed)
Chronic, stable.  Continue current medication regimen and adjust as needed. Denies SI/HI.  Follow-up in 6 months for mood.

## 2022-07-30 NOTE — Assessment & Plan Note (Signed)
Diagnosed 04/14/22, followed by urology. Recent notes reviewed from urology and oncology.  He is pondering Proton therapy in [place].  Has been very diet focused.  PSA today per his request.

## 2022-07-30 NOTE — Progress Notes (Signed)
BP 128/86   Pulse 69   Temp 97.9 F (36.6 C) (Oral)   Ht 6' 2.02" (1.88 m)   Wt 166 lb 4.8 oz (75.4 kg)   SpO2 94%   BMI 21.34 kg/m    Subjective:    Patient ID: Jonathan Phenix., male    DOB: [birth year], 67 y.o.   MRN: [id number]  HPI: Jonathan Strahl. is a 67 y.o. male  Chief Complaint  Patient presents with   Hypertension   Hyperlipidemia   Mood   PSA Check   HYPERTENSION / HYPERLIPIDEMIA Taking Lisinopril 10 MG daily, previously on Amlodipine.  No medications for cholesterol, does not wish to start -- is taking fish oil.  Currently on whole food, vegan diet -- [employer] == this has lowered his cholesterol and PSA levels.    He tried Lisinopril and Losartan in past for BP, caused weird feelings.  However, at this time is tolerating Lisinopril back on board. Satisfied with current treatment? yes Duration of hypertension: chronic BP monitoring frequency: not checking BP range:  BP medication side effects: no Duration of hyperlipidemia: chronic Cholesterol supplements: fish oil and bran Aspirin: yes Recent stressors: no Recurrent headaches: no Visual changes: no Palpitations: no Dyspnea: no Chest pain: no Lower extremity edema: no The 10-year ASCVD risk score (Arnett DK, et al., 2019) is: 11%   Values used to calculate the score:     Age: 35 years     Sex: Male     Is Non-Hispanic African American: No     Diabetic: No     Tobacco smoker: No     Systolic Blood Pressure: 0000000 mmHg     Is BP treated: Yes     HDL Cholesterol: 71 mg/dL     Total Cholesterol: 152 mg/dL  HYPERTHYROIDISM Followed by endocrinology, last visit 05/19/22.  Taking Methimazole 5 MG daily. Thyroid control status:stable Satisfied with current treatment? yes Medication side effects: no Medication compliance: good compliance Fatigue: no Cold intolerance: no Heat intolerance: no Weight gain: no Weight loss: yes - expected, has been working on this Constipation:  no Diarrhea/loose stools: no Palpitations: no Lower extremity edema: no Anxiety/depressed mood: occasional  PROSTATE CANCER Last visit with urology on 04/30/22 and oncology 05/13/22.  2 ROI biopsies were positive for Gleason 3+4 adenocarcinoma in 1 Gleason 3+3 adenocarcinoma. He is pondering proton therapy and has been working on diet changes.  Saw Dr. Winona Legato about proton therapy in [place] [facility]ines). Nocturia: 2-3 times a night Urinary frequency: no Incomplete voiding: no Urgency: no Weak urinary stream: no Straining to start stream: no Dysuria: no  DEPRESSION Continues on Paxil 20 MG, recent stressors with prostate cancer diagnosis.  Has history of low levels Vitamin D on labs and is taking supplement.   Mood status: stable Satisfied with current treatment?: yes Symptom severity: mild  Duration of current treatment : chronic Side effects: no Medication compliance: good compliance Psychotherapy/counseling: none Depressed mood: occasional Anxious mood: no Anhedonia: no Significant weight loss or gain: no Insomnia:  none Fatigue: no Feelings of worthlessness or guilt: no Impaired concentration/indecisiveness: occasional Suicidal ideations: no Hopelessness: no Crying spells: no    07/30/2022   10:50 AM 01/28/2022   11:31 AM 08/20/2021    4:07 PM 12/23/2020    3:21 PM 06/24/2020    3:08 PM  Depression screen PHQ 2/9  Decreased Interest 1 0 0 0 0  Down, Depressed, Hopeless 1 0 0 0 0  PHQ -  2 Score 2 0 0 0 0  Altered sleeping 0 0 0 0 0  Tired, decreased energy 0 1 2 0 0  Change in appetite 0  0 0 0  Feeling bad or failure about yourself  0 0 0 0 0  Trouble concentrating 0 0 0 0 0  Moving slowly or fidgety/restless 0 1 0 0 0  Suicidal thoughts 0 0 0 0 0  PHQ-9 Score '2 2 2 '$ 0 0  Difficult doing work/chores Somewhat difficult Not difficult at all Not difficult at all Not difficult at all       07/30/2022   10:50 AM 01/28/2022   11:31 AM 08/20/2021    4:08 PM 08/10/2018    10:43 AM  GAD 7 : Generalized Anxiety Score  Nervous, Anxious, on Edge 0 0 0   Control/stop worrying 0 0 0 1  Worry too much - different things 0 0 0 1  Trouble relaxing 0 0 0 0  Restless 0 1 0 1  Easily annoyed or irritable 0 0 0 0  Afraid - awful might happen 0 0 0 0  Total GAD 7 Score 0 1 0   Anxiety Difficulty Not difficult at all Not difficult at all Not difficult at all    Relevant past medical, surgical, family and social history reviewed and updated as indicated. Interim medical history since our last visit reviewed. Allergies and medications reviewed and updated.  Review of Systems  Constitutional:  Negative for activity change, diaphoresis, fatigue and fever.  Respiratory:  Negative for cough, chest tightness, shortness of breath and wheezing.   Cardiovascular:  Negative for chest pain, palpitations and leg swelling.  Gastrointestinal: Negative.   Neurological: Negative.   Psychiatric/Behavioral: Negative.     Per HPI unless specifically indicated above     Objective:    BP 128/86   Pulse 69   Temp 97.9 F (36.6 C) (Oral)   Ht 6' 2.02" (1.88 m)   Wt 166 lb 4.8 oz (75.4 kg)   SpO2 94%   BMI 21.34 kg/m   Wt Readings from Last 3 Encounters:  07/30/22 166 lb 4.8 oz (75.4 kg)  05/19/22 178 lb (80.7 kg)  05/13/22 182 lb (82.6 kg)    Physical Exam Vitals and nursing note reviewed.  Constitutional:      General: He is awake. He is not in acute distress.    Appearance: He is well-developed and well-groomed. He is not ill-appearing or toxic-appearing.  HENT:     Head: Normocephalic.     Right Ear: Hearing and external ear normal.     Left Ear: Hearing and external ear normal.  Eyes:     General: Lids are normal.     Extraocular Movements: Extraocular movements intact.     Conjunctiva/sclera: Conjunctivae normal.  Neck:     Thyroid: No thyromegaly.     Vascular: No carotid bruit.  Cardiovascular:     Rate and Rhythm: Normal rate and regular rhythm.      Heart sounds: Normal heart sounds.  Pulmonary:     Effort: No accessory muscle usage or respiratory distress.     Breath sounds: Normal breath sounds.  Abdominal:     General: Bowel sounds are normal. There is no distension.     Palpations: Abdomen is soft.     Tenderness: There is no abdominal tenderness.  Musculoskeletal:     Cervical back: Full passive range of motion without pain.     Right lower leg: No  edema.     Left lower leg: No edema.  Skin:    General: Skin is warm.     Capillary Refill: Capillary refill takes less than 2 seconds.  Neurological:     Mental Status: He is alert and oriented to person, place, and time.     Deep Tendon Reflexes: Reflexes are normal and symmetric.     Reflex Scores:      Brachioradialis reflexes are 2+ on the right side and 2+ on the left side.      Patellar reflexes are 2+ on the right side and 2+ on the left side. Psychiatric:        Attention and Perception: Attention normal.        Mood and Affect: Mood normal.        Speech: Speech normal.        Behavior: Behavior normal. Behavior is cooperative.        Thought Content: Thought content normal.    Results for orders placed or performed in visit on 06/30/22  PSA  Result Value Ref Range   Prostate Specific Ag, Serum 4.9 (H) 0.0 - 4.0 ng/mL      Assessment & Plan:   Problem List Items Addressed This Visit       Cardiovascular and Mediastinum   Essential hypertension, benign    Chronic, ongoing.  BP at goal in office today and improved with change to Lisinopril.  Recommend she monitor BP at least a few mornings a week at home and document.  DASH diet at home.  Continue current medication regimen and adjust as needed.  Labs today: CBC, CMP, Lipid.         Relevant Orders   Comprehensive metabolic panel   CBC with Differential/Platelet     Endocrine   Hyperthyroidism    Diagnosed 05/08/22.  Followed by endocrinology.  Continue current medication regimen as ordered by them.   Recent notes reviewed.  Obtain TSH, Free T4, Free T3 for endo today and send to them for review.      Relevant Orders   T4, free   TSH   T3, free     Genitourinary   Prostate cancer ([place]) - Primary    Diagnosed 04/14/22, followed by urology. Recent notes reviewed from urology and oncology.  He is pondering Proton therapy in [place].  Has been very diet focused.  PSA today per his request.      Relevant Orders   PSA     Other   Hyperlipidemia    Chronic, ongoing.  He refuses statin at this time and wishes to continue to focus on lifestyle changes + current diet changes.  Discussed risk with current ASCVD score and recommendations for statin.  Will recheck lipid panel today.      Relevant Orders   Comprehensive metabolic panel   Lipid Panel w/o Chol/HDL Ratio   Hyponatremia    Ongoing onrecent labs, check CMP today.  Is on Paxil which could lower NA+.  Recommend add a little table salt to diet.      Relevant Orders   Comprehensive metabolic panel   Major depression in full remission (HCC)    Chronic, stable.  Continue current medication regimen and adjust as needed. Denies SI/HI.  Follow-up in 6 months for mood.      Vitamin D deficiency    Chronic, ongoing.  Check Vit D level today and recommend continue daily supplement.      Relevant Orders   VITAMIN D  25 Hydroxy (Vit-D Deficiency, Fractures)     Follow up plan: Return in about 6 months (around 01/28/2023) for HTN/HLD, PROSTATE CA, HYPERTHYROID, MOOD.

## 2022-07-30 NOTE — Assessment & Plan Note (Signed)
Chronic, ongoing.  BP at goal in office today and improved with change to Lisinopril.  Recommend she monitor BP at least a few mornings a week at home and document.  DASH diet at home.  Continue current medication regimen and adjust as needed.  Labs today: CBC, CMP, Lipid.

## 2022-07-30 NOTE — Assessment & Plan Note (Signed)
Ongoing onrecent labs, check CMP today.  Is on Paxil which could lower NA+.  Recommend add a little table salt to diet.

## 2022-07-30 NOTE — Assessment & Plan Note (Signed)
Chronic, ongoing.  Check Vit D level today and recommend continue daily supplement.

## 2022-07-30 NOTE — Assessment & Plan Note (Signed)
Chronic, ongoing.  He refuses statin at this time and wishes to continue to focus on lifestyle changes + current diet changes.  Discussed risk with current ASCVD score and recommendations for statin.  Will recheck lipid panel today.

## 2022-07-30 NOTE — Assessment & Plan Note (Signed)
Diagnosed 05/08/22.  Followed by endocrinology.  Continue current medication regimen as ordered by them.  Recent notes reviewed.  Obtain TSH, Free T4, Free T3 for endo today and send to them for review.

## 2022-07-31 ENCOUNTER — Telehealth (INDEPENDENT_AMBULATORY_CARE_PROVIDER_SITE_OTHER): Payer: Medicare HMO | Admitting: Urology

## 2022-07-31 ENCOUNTER — Encounter: Payer: Medicare HMO | Admitting: Urology

## 2022-07-31 DIAGNOSIS — C61 Malignant neoplasm of prostate: Secondary | ICD-10-CM

## 2022-07-31 LAB — CBC WITH DIFFERENTIAL/PLATELET
Basophils Absolute: 0.1 10*3/uL (ref 0.0–0.2)
Basos: 2 %
EOS (ABSOLUTE): 0.2 10*3/uL (ref 0.0–0.4)
Eos: 6 %
Hematocrit: 43.1 % (ref 37.5–51.0)
Hemoglobin: 14.8 g/dL (ref 13.0–17.7)
Immature Grans (Abs): 0 10*3/uL (ref 0.0–0.1)
Immature Granulocytes: 0 %
Lymphocytes Absolute: 0.7 10*3/uL (ref 0.7–3.1)
Lymphs: 22 %
MCH: 32.5 pg (ref 26.6–33.0)
MCHC: 34.3 g/dL (ref 31.5–35.7)
MCV: 95 fL (ref 79–97)
Monocytes Absolute: 0.4 10*3/uL (ref 0.1–0.9)
Monocytes: 12 %
Neutrophils Absolute: 1.8 10*3/uL (ref 1.4–7.0)
Neutrophils: 58 %
Platelets: 203 10*3/uL (ref 150–450)
RBC: 4.56 x10E6/uL (ref 4.14–5.80)
RDW: 12.1 % (ref 11.6–15.4)
WBC: 3.1 10*3/uL — ABNORMAL LOW (ref 3.4–10.8)

## 2022-07-31 LAB — COMPREHENSIVE METABOLIC PANEL
ALT: 14 IU/L (ref 0–44)
AST: 21 IU/L (ref 0–40)
Albumin/Globulin Ratio: 1.6 (ref 1.2–2.2)
Albumin: 4.7 g/dL (ref 3.9–4.9)
Alkaline Phosphatase: 109 IU/L (ref 44–121)
BUN/Creatinine Ratio: 7 — ABNORMAL LOW (ref 10–24)
BUN: 5 mg/dL — ABNORMAL LOW (ref 8–27)
Bilirubin Total: 0.9 mg/dL (ref 0.0–1.2)
CO2: 27 mmol/L (ref 20–29)
Calcium: 9.5 mg/dL (ref 8.6–10.2)
Chloride: 94 mmol/L — ABNORMAL LOW (ref 96–106)
Creatinine, Ser: 0.69 mg/dL — ABNORMAL LOW (ref 0.76–1.27)
Globulin, Total: 3 g/dL (ref 1.5–4.5)
Glucose: 82 mg/dL (ref 70–99)
Potassium: 4 mmol/L (ref 3.5–5.2)
Sodium: 135 mmol/L (ref 134–144)
Total Protein: 7.7 g/dL (ref 6.0–8.5)
eGFR: 102 mL/min/{1.73_m2} (ref >59)

## 2022-07-31 LAB — VITAMIN D 25 HYDROXY (VIT D DEFICIENCY, FRACTURES): Vit D, 25-Hydroxy: 39.3 ng/mL (ref 30.0–100.0)

## 2022-07-31 LAB — LIPID PANEL W/O CHOL/HDL RATIO
Cholesterol, Total: 167 mg/dL (ref 100–199)
HDL: 81 mg/dL (ref >39)
LDL Chol Calc (NIH): 75 mg/dL (ref 0–99)
Triglycerides: 51 mg/dL (ref 0–149)
VLDL Cholesterol Cal: 11 mg/dL (ref 5–40)

## 2022-07-31 LAB — TSH: TSH: 0.17 u[IU]/mL — ABNORMAL LOW (ref 0.450–4.500)

## 2022-07-31 LAB — T3, FREE: T3, Free: 2.8 pg/mL (ref 2.0–4.4)

## 2022-07-31 LAB — T4, FREE: Free T4: 1.17 ng/dL (ref 0.82–1.77)

## 2022-07-31 LAB — PSA: Prostate Specific Ag, Serum: 6.1 ng/mL — ABNORMAL HIGH (ref 0.0–4.0)

## 2022-07-31 NOTE — Progress Notes (Signed)
   Virtual Visit via Telephone Note  I connected with Jonathan Roberson. on 07/31/22 at 10:15 AM by telephone and verified that I am speaking with the correct person using two identifiers.   Patient location: Home Provider location: [facility]   I discussed the limitations, risks, security and privacy concerns of performing an evaluation and management service by telephone and the availability of in person appointments. We discussed the impact of the COVID-19 pandemic on the healthcare system, and the importance of social distancing and reducing patient and provider exposure. I also discussed with the patient that there may be a patient responsible charge related to this service. The patient expressed understanding and agreed to proceed.  Urologic history: 1.  Prostate cancer cT2N0M0 unfavorable intermediate risk PSA 5.9 on 01/28/2022; DRE firm left prostate MRI 01/2022 PI-RADS 5 lesion left PZ; PI-RADS 4 lesion right PZ; volume 29 cc MR fusion biopsy [place] 03/24/2022; prostate volume 29 cc; 18 cores obtain; 2 ROI biopsies were positive for Gleason 3+4 adenocarcinoma in 1 Gleason 3+3 adenocarcinoma; 10/12 template biopsies were positive for Gleason 3+3/3+4/4+3 with tissue involvement ranging from 10-80% PSMA/PET 04/14/2022 showed no evidence of metastatic disease  Reason for visit: Prostate cancer follow-up  History of Present Illness: Jonathan Roberson was seen and radiation oncology December 2023 with recommendation of I-125 brachytherapy + ADT.  He was also seen at the [facility] in [place] earlier this month and he is fairly certain he is going to proceed with this treatment  ADT was recommended and to avoid ADT side effects a 92-monthinjection was recommended which we do not stock at our practice.  I did offer him the option of Orgovyx however he [place] 318[place] more recent  MRI for proton therapy  Assessment and Plan: Intermediate risk unfavorable prostate cancer He has elected proton therapy and MRI was ordered He will call back if he desires to start Orgovyx  Follow Up: He will follow-up with me after his therapy      ScAbbie SonsMD

## 2022-07-31 NOTE — Progress Notes (Signed)
Contacted via MyChart   Good evening [place], your labs have returned: - Thyroid labs remain on lower side TSH, but some improvement.  Free T4 and 3 are normal. I will pass on to endocrinology for review. - PSA remains elevated at 6.1, will pass on to urology for awareness. - Kidney function, creatinine and eGFR, remains stable, as is liver function, AST and ALT.  - White blood cell count mildly low, we will recheck next visit. - Cholesterol labs and Vitamin D look great.  Any questions? Keep being amazing!!  Thank you for allowing me to participate in your care.  I appreciate you. Kindest regards, Silvia Markuson

## 2022-08-03 ENCOUNTER — Encounter: Payer: Self-pay | Admitting: "Endocrinology

## 2022-08-04 ENCOUNTER — Ambulatory Visit: Payer: Medicare HMO

## 2022-08-04 ENCOUNTER — Ambulatory Visit: Payer: Medicare HMO | Admitting: Radiation Oncology

## 2022-08-04 DIAGNOSIS — J301 Allergic rhinitis due to pollen: Secondary | ICD-10-CM | POA: Diagnosis not present

## 2022-08-06 DIAGNOSIS — C61 Malignant neoplasm of prostate: Secondary | ICD-10-CM | POA: Diagnosis not present

## 2022-08-10 ENCOUNTER — Other Ambulatory Visit: Payer: Medicare HMO

## 2022-08-10 DIAGNOSIS — C61 Malignant neoplasm of prostate: Secondary | ICD-10-CM | POA: Diagnosis not present

## 2022-08-11 DIAGNOSIS — J301 Allergic rhinitis due to pollen: Secondary | ICD-10-CM | POA: Diagnosis not present

## 2022-08-12 ENCOUNTER — Telehealth: Payer: Self-pay | Admitting: Urology

## 2022-08-12 NOTE — Telephone Encounter (Signed)
Just FYI; Patient called to let office know that [facility] Falling would be reaching out to get approval for Orgovyx. Please advise patient of status.

## 2022-08-13 ENCOUNTER — Ambulatory Visit
Admission: RE | Admit: 2022-08-13 | Discharge: 2022-08-13 | Disposition: A | Discharge status: Home / Self Care | Payer: Medicare HMO | Source: Ambulatory Visit | Attending: Radiation Oncology | Admitting: Radiation Oncology

## 2022-08-13 ENCOUNTER — Encounter: Payer: Self-pay | Admitting: Radiation Oncology

## 2022-08-13 VITALS — HR 81 | Temp 98.0°F | Wt 170.0 lb

## 2022-08-13 DIAGNOSIS — C61 Malignant neoplasm of prostate: Secondary | ICD-10-CM | POA: Diagnosis not present

## 2022-08-13 DIAGNOSIS — Z191 Hormone sensitive malignancy status: Secondary | ICD-10-CM | POA: Diagnosis not present

## 2022-08-13 NOTE — Progress Notes (Signed)
Radiation Oncology Follow up Note  Name: Jonathan Roberson.   Date:   08/13/2022 MRN:  [id number] DOB: Feb 04, 1956    This 67 y.o. male presents to the clinic today for further discussion regarding treatment for stage IIb (cT2 N0 M0) Gleason 7 adenocarcinoma the prostate presenting with a PSA of 6.Marland Kitchen  REFERRING PROVIDER: [name], NP  HPI: Patient is a 67 year old male originally consulted back in December for Gleason 7 (3+4) presenting with a PSA in the 6 range.  Patient has been for second opinion for proton beam therapy as well as [facility] for CyberKnife therapy.  He has decided on I-125 interstitial implant.  He is doing fairly well no increased lower urinary tract symptoms.  His PSA last tested was 6.1.  Up slightly from 4.9 a month ago.  Patient also has been recently diagnosed with hyperthyroidism.  He was also seeing if he can go on the oral ADT agent rather than Eligard.  COMPLICATIONS OF TREATMENT: none  FOLLOW UP COMPLIANCE: keeps appointments   PHYSICAL EXAM:  Pulse 81   Temp 98 F (36.7 C)   Wt 170 lb (77.1 kg)   BMI 21.82 kg/m  Well-developed well-nourished patient in NAD. HEENT reveals PERLA, EOMI, discs not visualized.  Oral cavity is clear. No oral mucosal lesions are identified. Neck is clear without evidence of cervical or supraclavicular adenopathy. Lungs are clear to A&P. Cardiac examination is essentially unremarkable with regular rate and rhythm without murmur rub or thrill. Abdomen is benign with no organomegaly or masses noted. Motor sensory and DTR levels are equal and symmetric in the upper and lower extremities. Cranial nerves II through XII are grossly intact. Proprioception is intact. No peripheral adenopathy or edema is identified. No motor or sensory levels are noted. Crude visual fields are within normal range.  RADIOLOGY RESULTS: No films for review  PLAN: Present time again have gone over the risks and benefits of I-125 interstitial implant.  We are  going to proceed to schedule a volume study and his implant with Dr. Bernardo Heater.  I have already written gone over recommendations and risks and benefits including increased lower urinary tract symptoms possible diarrhea possible fatigue and radiation safety precautions for his implant.  Will also review those again next time I see him in follow-up for the volume study.  We are having our staff schedule those procedures at this time.  Patient to contact me with any further questions.  I would like to take this opportunity to thank you for allowing me to participate in the care of your patient..  I would like to take this opportunity to thank you for allowing me to participate in the care of your patient.Noreene Filbert, MD

## 2022-08-18 ENCOUNTER — Ambulatory Visit: Payer: Medicare HMO | Admitting: "Endocrinology

## 2022-08-18 ENCOUNTER — Ambulatory Visit: Admit: 2022-08-18 | Payer: Medicare HMO | Admitting: Urology

## 2022-08-18 ENCOUNTER — Encounter: Payer: Self-pay | Admitting: "Endocrinology

## 2022-08-18 VITALS — BP 136/84 | HR 64 | Ht 74.0 in | Wt 166.4 lb

## 2022-08-18 DIAGNOSIS — E782 Mixed hyperlipidemia: Secondary | ICD-10-CM

## 2022-08-18 DIAGNOSIS — E0590 Thyrotoxicosis, unspecified without thyrotoxic crisis or storm: Secondary | ICD-10-CM

## 2022-08-18 DIAGNOSIS — I10 Essential (primary) hypertension: Secondary | ICD-10-CM

## 2022-08-18 DIAGNOSIS — J301 Allergic rhinitis due to pollen: Secondary | ICD-10-CM | POA: Diagnosis not present

## 2022-08-18 SURGERY — INSERTION, RADIATION SOURCE, PROSTATE
Anesthesia: General

## 2022-08-18 MED ORDER — LISINOPRIL 20 MG PO TABS: 20.0000 mg | ORAL_TABLET | Freq: Every day | ORAL | 1 refills | Status: DC

## 2022-08-18 NOTE — Progress Notes (Signed)
08/18/2022, 1:29 PM  Endocrinology follow-up note               Subjective:    Patient ID: Jonathan Phenix., male    DOB: 04/01/1956, PCP Jonathan Lick, NP   Past Medical History:  Diagnosis Date   Allergy    Allergy to dust    Arthritis    hands, hips, knees   Asthma    Depression    Hyperlipidemia    Hypertension    Thyroid disease    Past Surgical History:  Procedure Laterality Date   BIOPSY N/A 08/14/2016   Procedure: BIOPSY;  Surgeon: Lucilla Lame, MD;  Location: [place];  Service: Endoscopy;  Laterality: N/A;   COLONOSCOPY WITH PROPOFOL N/A 08/14/2016   Procedure: COLONOSCOPY WITH PROPOFOL;  Surgeon: Lucilla Lame, MD;  Location: [place];  Service: Endoscopy;  Laterality: N/A;   IMAGE GUIDED SINUS SURGERY Bilateral 09/24/2015   Procedure: IMAGE GUIDED SINUS SURGERY Bilateral nasal polypectomy,maxillary antrostomy with tissue removal,bilateral ethmoidectomy,frontal sinusotomy;  Surgeon: Clyde Canterbury, MD;  Location: [place];  Service: ENT;  Laterality: Bilateral;  GAVE DISK TO CE CE 4-12 KP   NASAL SINUS SURGERY  2005   Dr. Nadeen Landau, [facility]   Social History   Socioeconomic History   Marital status: Married    Spouse name: Not on file   Number of children: Not on file   Years of education: Not on file   Highest education level: Not on file  Occupational History   Not on file  Tobacco Use   Smoking status: Never   Smokeless tobacco: Never  Vaping Use   Vaping Use: Never used  Substance and Sexual Activity   Alcohol use: Not Currently    Alcohol/week: 0.0 standard drinks of alcohol    Comment: on occasion- about once a year   Drug use: No   Sexual activity: Yes  Other Topics Concern   Not on file  Social History Narrative   Not on file   Social Determinants of Health   Financial Resource Strain: Not on file  Food Insecurity: Not on file  Transportation Needs:  Not on file  Physical Activity: Not on file  Stress: Not on file  Social Connections: Not on file   Family History  Problem Relation Age of Onset   Heart disease Mother    Depression Mother    Arthritis Sister    Emphysema Father    Depression Daughter    Outpatient Encounter Medications as of 08/18/2022  Medication Sig   amLODipine (NORVASC) 5 MG tablet Take 5 mg by mouth daily.   Cyanocobalamin (VITAMIN B 12) 500 MCG TABS Take 1 tablet by mouth daily with lunch.   albuterol (PROVENTIL) (2.5 MG/3ML) 0.083% nebulizer solution USE 1 VIAL (3 ML) (2.5 MG TOTAL) VIA NEBULIZER EVERY 6 HOURS AS NEEDED FOR WHEEZING OR SHORTNESS OF BREATH   albuterol (VENTOLIN HFA) 108 (90 Base) MCG/ACT inhaler Inhale 2 puffs into the lungs every 6 (six) hours as needed for wheezing or shortness of breath.   Aspirin Buf,CaCarb-MgCarb-MgO, 81 MG TABS Take by mouth.   fluticasone (FLONASE) 50 MCG/ACT nasal spray PLACE 2 SPRAYS INTO BOTH NOSTRILS ONCE  DAILY   fluticasone-salmeterol (ADVAIR) 100-50 MCG/ACT AEPB Inhale 1 puff into the lungs 2 (two) times daily.   lisinopril (ZESTRIL) 20 MG tablet Take 1 tablet (20 mg total) by mouth daily.   loratadine (CLARITIN) 10 MG tablet Take 10 mg by mouth daily as needed for allergies.   methimazole (TAPAZOLE) 5 MG tablet Take 1 tablet (5 mg total) by mouth daily.   NON FORMULARY Allergy injections weekly   PARoxetine (PAXIL) 20 MG tablet Take 1 tablet (20 mg total) by mouth daily. (Patient not taking: Reported on 08/18/2022)   Turmeric (QC TUMERIC COMPLEX PO) Take by mouth daily.   VITAMIN D, ERGOCALCIFEROL, PO Take 1 tablet by mouth daily.   [DISCONTINUED] lisinopril (ZESTRIL) 10 MG tablet Take 1 tablet (10 mg total) by mouth daily.   No facility-administered encounter medications on file as of 08/18/2022.   ALLERGIES: Allergies  Allergen Reactions   Losartan Shortness Of Breath   Shrimp [Shellfish Allergy] Shortness Of Breath    When eaten in large amounts    Monosodium Glutamate     headaches   Amoxicillin Rash   Erythromycin Rash   Penicillins Rash    VACCINATION STATUS: Immunization History  Administered Date(s) Administered   Influenza,inj,Quad PF,6+ Mos 05/11/2018   Influenza-Unspecified 03/02/2016, 03/22/2017, 03/16/2019   PFIZER(Purple Top)SARS-COV-2 Vaccination 08/11/2019, 09/01/2019   PNEUMOCOCCAL CONJUGATE-20 01/28/2022   Pneumococcal Polysaccharide-23 04/18/2014   Tdap 04/07/2013    HPI Jonathan Roberson. is 67 y.o. male who presents today with a medical history as above. he is being seen in follow-up for hyperthyroidism.  His previsit labs show evidence of treatment effect with improving thyroid profile, improving lipid profile and patient continues to feel better clinically.    He has no new complaints. His recent thyroid uptake and scan was unremarkable, did not require ablative treatment.  However, his thyroid function tests are indicative of mild Graves' disease with positive TPO antibodies.    He denies palpitations, heat intolerance.  Denies dysphagia, shortness of breath, nor voice change. He underwent thyroid ultrasound on March 02, 2022 which showed normal thyroid ultrasound. Specifically, no evidence of thyromegaly,  atrophy or discrete thyroid nodule or mass.  He is now on low-dose methimazole 5 mg p.o. daily.  He has family history of hypothyroidism in his sister.  No family history of thyroid malignancy. His other medical problems include hyperlipidemia and hypertension.  He is not on statins for now, presents with significant improvement in his lipid panel from whole food plant-based diet.   He is being considered for treatment for prostate cancer. He is a non-smoker, nonalcohol user.  Review of Systems    Objective:       08/18/2022   10:23 AM 08/13/2022    2:25 PM 07/30/2022   10:46 AM  Vitals with BMI  Height 6\' 2"   6' 2.016"  Weight 166 lbs 6 oz 170 lbs 166 lbs 5 oz  BMI 21.36 [id number] 0000000   Systolic XX123456  0000000  Diastolic 84  86  Pulse 64 81 69    BP 136/84   Pulse 64   Ht 6\' 2"  (1.88 m)   Wt 166 lb 6.4 oz (75.5 kg)   BMI 21.36 kg/m   Wt Readings from Last 3 Encounters:  08/18/22 166 lb 6.4 oz (75.5 kg)  08/13/22 170 lb (77.1 kg)  07/30/22 166 lb 4.8 oz (75.4 kg)    Physical Exam    CMP ( most recent) CMP     Component  Value Date/Time   NA 135 07/30/2022 1130   K 4.0 07/30/2022 1130   CL 94 (L) 07/30/2022 1130   CO2 27 07/30/2022 1130   GLUCOSE 82 07/30/2022 1130   BUN 5 (L) 07/30/2022 1130   CREATININE 0.69 (L) 07/30/2022 1130   CALCIUM 9.5 07/30/2022 1130   PROT 7.7 07/30/2022 1130   ALBUMIN 4.7 07/30/2022 1130   AST 21 07/30/2022 1130   ALT 14 07/30/2022 1130   ALKPHOS 109 07/30/2022 1130   BILITOT 0.9 07/30/2022 1130   GFRNONAA 93 06/24/2020 1555   GFRAA 108 06/24/2020 1555       Lipid Panel ( most recent) Lipid Panel     Component Value Date/Time   CHOL 167 07/30/2022 1130   TRIG 51 07/30/2022 1130   HDL 81 07/30/2022 1130   CHOLHDL 2.1 05/11/2022 0941   LDLCALC 75 07/30/2022 1130   LABVLDL 11 07/30/2022 1130      Lab Results  Component Value Date   TSH 0.170 (L) 07/30/2022   TSH <0.005 (L) 05/11/2022   TSH 0.179 (L) 01/28/2022   TSH 0.173 (L) 12/23/2020   TSH 0.021 (L) 06/24/2020   TSH 0.675 06/19/2019   TSH 0.947 06/29/2017   TSH 0.686 06/24/2016   TSH 1.020 04/29/2015   FREET4 1.17 07/30/2022   FREET4 1.78 (H) 05/11/2022   FREET4 1.40 12/23/2020    Reviewed ultrasound on March 02, 2022: Total right and left lobes measure 5.1 cm in greatest dimension. Normal thyroid ultrasound. Specifically, no evidence of thyromegaly, atrophy or discrete thyroid nodule or mass.  Thyroid uptake and scan on May 05, 2022  FINDINGS: Homogeneous tracer distribution in both thyroid lobes.   No focal areas of increased or decreased tracer localization.   4 hour I-123 uptake = 8.7% (normal 5-20%),  24 hour I-123 uptake = 21.9%  (normal 10-30%)   IMPRESSION: Normal exam.   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 Hyperlipidemia 3. Hypertension  Jonathan Roberson did have mild hyperthyroidism which did not require ablative treatment with radioactive iodine.  He is benefiting from low-dose methimazole, advised to continue methimazole 5 mg p.o. daily before breakfast.     He has significantly improved  lipid panel , LDL stabilizing at 76, generally improving from 122.  This is without any statin intervention.  He is encouraged to continue on whole food plant-based diet with more fiber intake . He is encouraged to stay close to his urologist for the care of his prostate cancer.He mentions that he may be treated with Orgovyx for prostate cancer and would like to know if there is any interaction with methimazole.  Per review of the literature there does not seem to be significant drug-drug interaction between these 2 medications.  She is expected to come off of methimazole in several weeks-months.  Previous Thyroid ultrasound is unremarkable, will not need intervention for now. For better control of BP, he is advised to continue lisinopril at 20 mg p.o. daily (40 mg seems to be causing cough to him, and did not tolerate losartan in the past).  He is advised to continue amlodipine 5 mg p.o. daily at breakfast as well.     - he is advised to maintain close follow up with Jonathan Lick, NP for primary care needs.  I spent  25  minutes in the care of the patient today including review of labs from Thyroid Function, CMP, and other relevant labs ; imaging/biopsy records (current and previous including  abstractions from other facilities); face-to-face time discussing  his lab results and symptoms, medications doses, his options of short and long term treatment based on the latest standards of care / guidelines;   and documenting the encounter.  Jonathan Phenix.  participated in the discussions, expressed  understanding, and voiced agreement with the above plans.  All questions were answered to his satisfaction. he is encouraged to contact clinic should he have any questions or concerns prior to his return visit.    Follow up plan: Return in about 10 weeks (around 10/27/2022) for Fasting Labs  in AM B4 8.   Glade Lloyd, MD [facility] [address] [place], [place] [address] Phone: [phone]  Fax: [phone]     08/18/2022, 1:29 PM  This note was partially dictated with voice recognition software. Similar sounding words can be transcribed inadequately or may not  be corrected upon review.

## 2022-08-20 ENCOUNTER — Other Ambulatory Visit: Payer: Self-pay

## 2022-08-20 DIAGNOSIS — C61 Malignant neoplasm of prostate: Secondary | ICD-10-CM

## 2022-08-20 NOTE — Progress Notes (Signed)
Brachytherapy Standing Order Form:  Part 1 Volume Study:  Date: 09/23/2022  CPT: [id number], [id number] Procedure: Prostate Volume Study  Part 2 Seed Implant:   Surgeon: John Giovanni, MD  Date: 10/20/2022    CPT code: [id number], [id number], [id number] Procedure: Brachytherapy Radioactive seed implant  Anesthesia: General  VTE: SCD's  Standing Orders:  -Ancef 2G IV,  -Fleets 2 hr prior, -NPO after midnight,  -UA & Culture

## 2022-08-21 DIAGNOSIS — C61 Malignant neoplasm of prostate: Secondary | ICD-10-CM | POA: Diagnosis not present

## 2022-08-21 DIAGNOSIS — E0590 Thyrotoxicosis, unspecified without thyrotoxic crisis or storm: Secondary | ICD-10-CM | POA: Diagnosis not present

## 2022-08-21 DIAGNOSIS — E782 Mixed hyperlipidemia: Secondary | ICD-10-CM | POA: Diagnosis not present

## 2022-08-22 LAB — COMPREHENSIVE METABOLIC PANEL
ALT: 14 IU/L (ref 0–44)
AST: 25 IU/L (ref 0–40)
Albumin/Globulin Ratio: 1.3 (ref 1.2–2.2)
Albumin: 4.4 g/dL (ref 3.9–4.9)
Alkaline Phosphatase: 113 IU/L (ref 44–121)
BUN/Creatinine Ratio: 7 — ABNORMAL LOW (ref 10–24)
BUN: 5 mg/dL — ABNORMAL LOW (ref 8–27)
Bilirubin Total: 0.8 mg/dL (ref 0.0–1.2)
CO2: 27 mmol/L (ref 20–29)
Calcium: 9.5 mg/dL (ref 8.6–10.2)
Chloride: 94 mmol/L — ABNORMAL LOW (ref 96–106)
Creatinine, Ser: 0.72 mg/dL — ABNORMAL LOW (ref 0.76–1.27)
Globulin, Total: 3.4 g/dL (ref 1.5–4.5)
Glucose: 85 mg/dL (ref 70–99)
Potassium: 4.3 mmol/L (ref 3.5–5.2)
Sodium: 134 mmol/L (ref 134–144)
Total Protein: 7.8 g/dL (ref 6.0–8.5)
eGFR: 101 mL/min/{1.73_m2} (ref >59)

## 2022-08-22 LAB — LIPID PANEL
Chol/HDL Ratio: 2 ratio (ref 0.0–5.0)
Cholesterol, Total: 153 mg/dL (ref 100–199)
HDL: 77 mg/dL (ref >39)
LDL Chol Calc (NIH): 68 mg/dL (ref 0–99)
Triglycerides: 33 mg/dL (ref 0–149)
VLDL Cholesterol Cal: 8 mg/dL (ref 5–40)

## 2022-08-22 LAB — VITAMIN B12: Vitamin B-12: 796 pg/mL (ref 232–1245)

## 2022-08-22 LAB — TSH: TSH: 1.42 u[IU]/mL (ref 0.450–4.500)

## 2022-08-22 LAB — T3, FREE: T3, Free: 2.6 pg/mL (ref 2.0–4.4)

## 2022-08-22 LAB — T4, FREE: Free T4: 1.17 ng/dL (ref 0.82–1.77)

## 2022-08-24 ENCOUNTER — Ambulatory Visit
Admission: RE | Admit: 2022-08-24 | Discharge: 2022-08-24 | Disposition: A | Discharge status: Home / Self Care | Payer: Medicare HMO | Source: Ambulatory Visit | Attending: Urology | Admitting: Urology

## 2022-08-24 DIAGNOSIS — C61 Malignant neoplasm of prostate: Secondary | ICD-10-CM | POA: Diagnosis not present

## 2022-08-24 MED ORDER — GADOBUTROL 1 MMOL/ML IV SOLN
7.5000 mL | Freq: Once | INTRAVENOUS | Status: AC | PRN
  Administered 2022-08-24: 7.5 mL via INTRAVENOUS

## 2022-08-25 ENCOUNTER — Encounter: Payer: Self-pay | Admitting: Urology

## 2022-08-25 DIAGNOSIS — C61 Malignant neoplasm of prostate: Secondary | ICD-10-CM | POA: Diagnosis not present

## 2022-08-25 DIAGNOSIS — Z191 Hormone sensitive malignancy status: Secondary | ICD-10-CM | POA: Diagnosis not present

## 2022-08-25 DIAGNOSIS — J301 Allergic rhinitis due to pollen: Secondary | ICD-10-CM | POA: Diagnosis not present

## 2022-08-26 LAB — SPECIMEN STATUS REPORT

## 2022-08-26 LAB — PSA: Prostate Specific Ag, Serum: 5.6 ng/mL — ABNORMAL HIGH (ref 0.0–4.0)

## 2022-09-01 DIAGNOSIS — J301 Allergic rhinitis due to pollen: Secondary | ICD-10-CM | POA: Diagnosis not present

## 2022-09-02 DIAGNOSIS — J301 Allergic rhinitis due to pollen: Secondary | ICD-10-CM | POA: Diagnosis not present

## 2022-09-02 DIAGNOSIS — C61 Malignant neoplasm of prostate: Secondary | ICD-10-CM | POA: Diagnosis not present

## 2022-09-08 DIAGNOSIS — J301 Allergic rhinitis due to pollen: Secondary | ICD-10-CM | POA: Diagnosis not present

## 2022-09-10 ENCOUNTER — Other Ambulatory Visit: Payer: Self-pay | Admitting: Family Medicine

## 2022-09-10 MED ORDER — ORGOVYX 120 MG PO TABS: ORAL_TABLET | ORAL | 5 refills | Status: DC

## 2022-09-15 ENCOUNTER — Encounter: Payer: Self-pay | Admitting: Family Medicine

## 2022-09-15 ENCOUNTER — Ambulatory Visit: Payer: Medicare HMO

## 2022-09-15 ENCOUNTER — Telehealth: Payer: Self-pay | Admitting: Family Medicine

## 2022-09-15 DIAGNOSIS — J301 Allergic rhinitis due to pollen: Secondary | ICD-10-CM | POA: Diagnosis not present

## 2022-09-15 NOTE — Telephone Encounter (Signed)
[employer] sent a letter stating he has a copay and they have been unable to reach patient. He has a copay and they are trying to set him up on an assistance program. [facility] for patient to return call.

## 2022-09-16 ENCOUNTER — Telehealth: Payer: Self-pay | Admitting: Nurse Practitioner

## 2022-09-16 NOTE — Telephone Encounter (Signed)
Contacted Jonathan Roberson. to schedule their annual wellness visit. Appointment made for 09/29/2022.  Jonathan Roberson; Care Guide Ambulatory Clinical Support [place] [facility] Direct Dial: [phone]

## 2022-09-22 DIAGNOSIS — J301 Allergic rhinitis due to pollen: Secondary | ICD-10-CM | POA: Diagnosis not present

## 2022-09-23 ENCOUNTER — Ambulatory Visit
Admission: RE | Admit: 2022-09-23 | Discharge: 2022-09-23 | Disposition: A | Discharge status: Home / Self Care | Payer: Medicare HMO | Source: Ambulatory Visit | Attending: Radiation Oncology | Admitting: Radiation Oncology

## 2022-09-23 ENCOUNTER — Ambulatory Visit: Admission: RE | Admit: 2022-09-23 | Payer: Medicare HMO | Source: Home / Self Care

## 2022-09-23 ENCOUNTER — Encounter: Payer: Self-pay | Admitting: Radiation Oncology

## 2022-09-23 VITALS — BP 141/92 | HR 68 | Temp 97.4°F | Resp 16 | Ht 73.0 in | Wt 175.0 lb

## 2022-09-23 DIAGNOSIS — J45909 Unspecified asthma, uncomplicated: Secondary | ICD-10-CM | POA: Insufficient documentation

## 2022-09-23 DIAGNOSIS — E079 Disorder of thyroid, unspecified: Secondary | ICD-10-CM | POA: Insufficient documentation

## 2022-09-23 DIAGNOSIS — E785 Hyperlipidemia, unspecified: Secondary | ICD-10-CM | POA: Insufficient documentation

## 2022-09-23 DIAGNOSIS — Z7951 Long term (current) use of inhaled steroids: Secondary | ICD-10-CM | POA: Insufficient documentation

## 2022-09-23 DIAGNOSIS — Z191 Hormone sensitive malignancy status: Secondary | ICD-10-CM | POA: Diagnosis not present

## 2022-09-23 DIAGNOSIS — I10 Essential (primary) hypertension: Secondary | ICD-10-CM | POA: Insufficient documentation

## 2022-09-23 DIAGNOSIS — C61 Malignant neoplasm of prostate: Secondary | ICD-10-CM | POA: Insufficient documentation

## 2022-09-23 DIAGNOSIS — Z79899 Other long term (current) drug therapy: Secondary | ICD-10-CM | POA: Insufficient documentation

## 2022-09-23 SURGERY — ULTRASOUND, PROSTATE, FOR VOLUME DETERMINATION
Anesthesia: Choice

## 2022-09-23 NOTE — H&P (View-Only) (Signed)
NEW PATIENT EVALUATION  Name: Jonathan J Borge Jr.  MRN: [id number]  Date:   09/23/2022     DOB: 11/21/1955   This 67 y.o. 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   PREVIOUS INVESTIGATIONS:  Pathology report reviewed Clinical notes reviewed PSMA PET and MRI scans reviewed   HPI: Patient is a 67-year-old male presents with a Gleason 7 (3+4) adenocarcinoma the prostate presenting with a PSA in the 6 range.  MRI scan of his prostate showed a PI-RADS category 5 lesion in the left anterior transitional zone also to PI-RADS category 4 lesions in the peripheral zone.  No evidence of extracapsular extension seminal vesicle involvement of pelvic adenopathy was noted.  Biopsy was positive for Gleason 7 (3+4) adenocarcinoma.  PSMA PET scan showed focal r radiotracer uptake to left anterior prostate consistent with known prostate cancer.  He is fairly asymptomatic specifically denies any increased lower urinary tract symptoms.  He is here today for a volume study in anticipation of the implant and he is doing well.  PLANNED TREATMENT REGIMEN: I-125 interstitial implant  PAST MEDICAL HISTORY:  has a past medical history of Allergy, Allergy to dust, Arthritis, Asthma, Depression, Hyperlipidemia, Hypertension, and Thyroid disease.    PAST SURGICAL HISTORY:  Past Surgical History:  Procedure Laterality Date   BIOPSY N/A 08/14/2016   Procedure: BIOPSY;  Surgeon: Darren Wohl, MD;  Location: [facility];  Service: Endoscopy;  Laterality: N/A;   COLONOSCOPY WITH PROPOFOL N/A 08/14/2016   Procedure: COLONOSCOPY WITH PROPOFOL;  Surgeon: Darren  Wohl, MD;  Location: [facility];  Service: Endoscopy;  Laterality: N/A;   IMAGE GUIDED SINUS SURGERY Bilateral 09/24/2015   Procedure: IMAGE GUIDED SINUS SURGERY Bilateral nasal polypectomy,maxillary antrostomy with tissue removal,bilateral ethmoidectomy,frontal sinusotomy;  Surgeon: Paul Bennett, MD;  Location: [facility];  Service: ENT;  Laterality: Bilateral;  GAVE DISK TO CE CE 4-12 KP   NASAL SINUS SURGERY  2005   Dr. Madison Clark, [facility]    FAMILY HISTORY: family history includes Arthritis in his sister; Depression in his daughter and mother; Emphysema in his father; Heart disease in his mother.  SOCIAL HISTORY:  reports that he has never smoked. He has never used smokeless tobacco. He reports that he does not currently use alcohol. He reports that he does not use drugs.  ALLERGIES: Losartan, Shrimp [shellfish allergy], Monosodium glutamate, Amoxicillin, Erythromycin, and Penicillins  MEDICATIONS:  Current Outpatient Medications  Medication Sig Dispense Refill   albuterol (PROVENTIL) (2.5 MG/3ML) 0.083% nebulizer solution USE 1 VIAL (3 ML) (2.5 MG TOTAL) VIA NEBULIZER EVERY 6 HOURS AS NEEDED FOR WHEEZING OR SHORTNESS OF BREATH 150 mL 20   albuterol (VENTOLIN HFA) 108 (90 Base) MCG/ACT inhaler Inhale 2 puffs into the lungs every 6 (six) hours as needed for wheezing or shortness of breath. 18 g 3   amLODipine (NORVASC) 5 MG tablet Take 5 mg by mouth daily.     Aspirin Buf,CaCarb-MgCarb-MgO, 81 MG TABS Take by mouth.     Cyanocobalamin (VITAMIN B 12) 500 MCG TABS Take 1 tablet by mouth daily with lunch.     fluticasone (FLONASE) 50 MCG/ACT nasal spray   PLACE 2 SPRAYS INTO BOTH NOSTRILS ONCE DAILY 48 g 1   fluticasone-salmeterol (ADVAIR) 100-50 MCG/ACT AEPB Inhale 1 puff into the lungs 2 (two) times daily. 1 each 3   lisinopril (ZESTRIL) 20 MG tablet Take 1 tablet (20 mg total) by mouth daily. 90 tablet 1   loratadine (CLARITIN) 10 MG tablet Take 10 mg by mouth daily as  needed for allergies.     methimazole (TAPAZOLE) 5 MG tablet Take 1 tablet (5 mg total) by mouth daily. 90 tablet 1   NON FORMULARY Allergy injections weekly     relugolix (ORGOVYX) 120 MG tablet 3 tablets on first day and then 1 tablet daily. 33 tablet 5   Turmeric (QC TUMERIC COMPLEX PO) Take by mouth daily.     VITAMIN D, ERGOCALCIFEROL, PO Take 1 tablet by mouth daily.     No current facility-administered medications for this encounter.    ECOG PERFORMANCE STATUS:  0 - Asymptomatic  REVIEW OF SYSTEMS: Patient denies any weight loss, fatigue, weakness, fever, chills or night sweats. Patient denies any loss of vision, blurred vision. Patient denies any ringing  of the ears or hearing loss. No irregular heartbeat. Patient denies heart murmur or history of fainting. Patient denies any chest pain or pain radiating to her upper extremities. Patient denies any shortness of breath, difficulty breathing at night, cough or hemoptysis. Patient denies any swelling in the lower legs. Patient denies any nausea vomiting, vomiting of blood, or coffee ground material in the vomitus. Patient denies any stomach pain. Patient states has had normal bowel movements no significant constipation or diarrhea. Patient denies any dysuria, hematuria or significant nocturia. Patient denies any problems walking, swelling in the joints or loss of balance. Patient denies any skin changes, loss of hair or loss of weight. Patient denies any excessive worrying or anxiety or significant depression. Patient denies any problems with insomnia. Patient denies excessive thirst, polyuria, polydipsia. Patient denies any swollen glands, patient denies easy bruising or easy bleeding. Patient denies any recent infections, allergies or URI. Patient "s visual fields have not changed significantly in recent time.   PHYSICAL EXAM: BP (!) 141/92 (BP Location: Left Arm, Patient Position: Sitting, Cuff Size: Large) Comment: post procedure  Pulse 68    Temp (!) 97.4 F (36.3 C) (Tympanic)   Resp 16   Ht 6' 1" (1.854 m)   Wt 175 lb (79.4 kg)   BMI 23.09 kg/m  Well-developed well-nourished patient in NAD. HEENT reveals PERLA, EOMI, discs not visualized.  Oral cavity is clear. No oral mucosal lesions are identified. Neck is clear without evidence of cervical or supraclavicular adenopathy. Lungs are clear to A&P. Cardiac examination is essentially unremarkable with regular rate and rhythm without murmur rub or thrill. Abdomen is benign with no organomegaly or masses noted. Motor sensory and DTR levels are equal and symmetric in the upper and lower extremities. Cranial nerves II through XII are grossly intact. Proprioception is intact. No peripheral adenopathy or edema is identified. No motor or sensory levels are noted. Crude visual fields are within normal range.  LABORATORY DATA: Labs reviewed    RADIOLOGY RESULTS: PSMA PET scan and MRI scan reviewed ultrasound used today for volume study   IMPRESSION: Stage IIb Gleason 7 adenocarcinoma the prostate in 66-year-old male for I-125 interstitial implant volume study today  PLAN: At this time patient is cleared to proceed with implant.  We performed volume study today showing excellent imaging and we will do our treatment planning based on   those images.  Risks and benefits of treatment including risk of general anesthesia radiation safety precautions increased lower urinary tract symptoms possible diarrhea fatigue all were reviewed in detail with the patient.  He comprehends my treatment plan well.  Patient is already scheduled for implant.    Cecil Bixby, MD         

## 2022-09-23 NOTE — Progress Notes (Signed)
Radiation Oncology Volume study note  Name: Jonathan Roberson.   Date:   09/23/2022 MRN:  [id number] DOB: 09/15/1955    This 67 y.o. male presents to the OR today for a volume study in anticipation of an I-125 interstitial implant for Gleason 7 adenocarcinoma the prostate REFERRING PROVIDER: Marjie Skiff, NP  HPI: Patient is a 67 year old male presents with a Gleason 7 (3+4) adenocarcinoma the prostate presenting with a PSA in the 6 range. MRI scan of his prostate showed a PI-RADS category 5 lesion in the left anterior transitional zone also to PI-RADS category 4 lesions in the peripheral zone. No evidence of extracapsular extension seminal vesicle involvement of pelvic adenopathy was noted. Biopsy was positive for Gleason 7 (3+4) adenocarcinoma. PSMA PET scan showed focal r radiotracer uptake to left anterior prostate consistent with known prostate cancer. He is fairly asymptomatic specifically denies any increased lower urinary tract symptoms. He is here today for a volume study in anticipation of the implant and he is doing well. .  COMPLICATIONS OF TREATMENT: none  FOLLOW UP COMPLIANCE: keeps appointments   PHYSICAL EXAM:  There were no vitals taken for this visit. Well-developed well-nourished patient in NAD. HEENT reveals PERLA, EOMI, discs not visualized.  Oral cavity is clear. No oral mucosal lesions are identified. Neck is clear without evidence of cervical or supraclavicular adenopathy. Lungs are clear to A&P. Cardiac examination is essentially unremarkable with regular rate and rhythm without murmur rub or thrill. Abdomen is benign with no organomegaly or masses noted. Motor sensory and DTR levels are equal and symmetric in the upper and lower extremities. Cranial nerves II through XII are grossly intact. Proprioception is intact. No peripheral adenopathy or edema is identified. No motor or sensory levels are noted. Crude visual fields are within normal range.  RADIOLOGY RESULTS:  Ultrasound used for volume study  PLAN: Patient was taken to the cystoscopy suite in the OR. Patient was placed in the low lithotomy position. Foley catheter was placed. Trans-rectal ultrasound probe was inserted into the rectum and prostate seminal vesicles were visualized as well as bladder base. stepping images were performed on a 5 mm increments. Images will be placed in [facility] treatment planning system to determine seed placement coordinates for eventual I-125 interstitial implant. Images will be reviewed with the physics and dosimetry staff for final quality approval. I personally was present for the volume study and assisted in delineation of contour volumes.  At the end of the procedure Foley catheter was removed, rectal ultrasound probe was removed. Patient tolerated his procedures extremely well with no side effects or complaints. Patient has given appointment for interstitial implant date. Consent was signed today as well as history and physical performed in preparation for his outpatient surgical implant.     Carmina Miller, MD

## 2022-09-23 NOTE — H&P (Signed)
NEW PATIENT EVALUATION  Name: Jonathan Roberson.  MRN: [id number]  Date:   09/23/2022     DOB: Oct 22, 1955   This 67 y.o. 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   PREVIOUS INVESTIGATIONS:  Pathology report reviewed Clinical notes reviewed PSMA PET and MRI scans reviewed   HPI: Patient is a 67 year old male presents with a Gleason 7 (3+4) adenocarcinoma the prostate presenting with a PSA in the 6 range.  MRI scan of his prostate showed a PI-RADS category 5 lesion in the left anterior transitional zone also to PI-RADS category 4 lesions in the peripheral zone.  No evidence of extracapsular extension seminal vesicle involvement of pelvic adenopathy was noted.  Biopsy was positive for Gleason 7 (3+4) adenocarcinoma.  PSMA PET scan showed focal r radiotracer uptake to left anterior prostate consistent with known prostate cancer.  He is fairly asymptomatic specifically denies any increased lower urinary tract symptoms.  He is here today for a volume study in anticipation of the implant and he is doing well.  PLANNED TREATMENT REGIMEN: I-125 interstitial implant  PAST MEDICAL HISTORY:  has a past medical history of Allergy, Allergy to dust, Arthritis, Asthma, Depression, Hyperlipidemia, Hypertension, and Thyroid disease.    PAST SURGICAL HISTORY:  Past Surgical History:  Procedure Laterality Date   BIOPSY N/A 08/14/2016   Procedure: BIOPSY;  Surgeon: Midge Minium, MD;  Location: [facility];  Service: Endoscopy;  Laterality: N/A;   COLONOSCOPY WITH PROPOFOL N/A 08/14/2016   Procedure: COLONOSCOPY WITH PROPOFOL;  Surgeon: Midge Minium, MD;  Location: [facility];  Service: Endoscopy;  Laterality: N/A;   IMAGE GUIDED SINUS SURGERY Bilateral 09/24/2015   Procedure: IMAGE GUIDED SINUS SURGERY Bilateral nasal polypectomy,maxillary antrostomy with tissue removal,bilateral ethmoidectomy,frontal sinusotomy;  Surgeon: Geanie Logan, MD;  Location: [facility];  Service: ENT;  Laterality: Bilateral;  GAVE DISK TO CE CE 4-12 KP   NASAL SINUS SURGERY  2005   Dr. Gertie Baron, [facility]    FAMILY HISTORY: family history includes Arthritis in his sister; Depression in his daughter and mother; Emphysema in his father; Heart disease in his mother.  SOCIAL HISTORY:  reports that he has never smoked. He has never used smokeless tobacco. He reports that he does not currently use alcohol. He reports that he does not use drugs.  ALLERGIES: Losartan, Shrimp [shellfish allergy], Monosodium glutamate, Amoxicillin, Erythromycin, and Penicillins  MEDICATIONS:  Current Outpatient Medications  Medication Sig Dispense Refill   albuterol (PROVENTIL) (2.5 MG/3ML) 0.083% nebulizer solution USE 1 VIAL (3 ML) (2.5 MG TOTAL) VIA NEBULIZER EVERY 6 HOURS AS NEEDED FOR WHEEZING OR SHORTNESS OF BREATH 150 mL 20   albuterol (VENTOLIN HFA) 108 (90 Base) MCG/ACT inhaler Inhale 2 puffs into the lungs every 6 (six) hours as needed for wheezing or shortness of breath. 18 g 3   amLODipine (NORVASC) 5 MG tablet Take 5 mg by mouth daily.     Aspirin Buf,CaCarb-MgCarb-MgO, 81 MG TABS Take by mouth.     Cyanocobalamin (VITAMIN B 12) 500 MCG TABS Take 1 tablet by mouth daily with lunch.     fluticasone (FLONASE) 50 MCG/ACT nasal spray  PLACE 2 SPRAYS INTO BOTH NOSTRILS ONCE DAILY 48 g 1   fluticasone-salmeterol (ADVAIR) 100-50 MCG/ACT AEPB Inhale 1 puff into the lungs 2 (two) times daily. 1 each 3   lisinopril (ZESTRIL) 20 MG tablet Take 1 tablet (20 mg total) by mouth daily. 90 tablet 1   loratadine (CLARITIN) 10 MG tablet Take 10 mg by mouth daily as  needed for allergies.     methimazole (TAPAZOLE) 5 MG tablet Take 1 tablet (5 mg total) by mouth daily. 90 tablet 1   NON FORMULARY Allergy injections weekly     relugolix (ORGOVYX) 120 MG tablet 3 tablets on first day and then 1 tablet daily. 33 tablet 5   Turmeric (QC TUMERIC COMPLEX PO) Take by mouth daily.     VITAMIN D, ERGOCALCIFEROL, PO Take 1 tablet by mouth daily.     No current facility-administered medications for this encounter.    ECOG PERFORMANCE STATUS:  0 - Asymptomatic  REVIEW OF SYSTEMS: Patient denies any weight loss, fatigue, weakness, fever, chills or night sweats. Patient denies any loss of vision, blurred vision. Patient denies any ringing  of the ears or hearing loss. No irregular heartbeat. Patient denies heart murmur or history of fainting. Patient denies any chest pain or pain radiating to her upper extremities. Patient denies any shortness of breath, difficulty breathing at night, cough or hemoptysis. Patient denies any swelling in the lower legs. Patient denies any nausea vomiting, vomiting of blood, or coffee ground material in the vomitus. Patient denies any stomach pain. Patient states has had normal bowel movements no significant constipation or diarrhea. Patient denies any dysuria, hematuria or significant nocturia. Patient denies any problems walking, swelling in the joints or loss of balance. Patient denies any skin changes, loss of hair or loss of weight. Patient denies any excessive worrying or anxiety or significant depression. Patient denies any problems with insomnia. Patient denies excessive thirst, polyuria, polydipsia. Patient denies any swollen glands, patient denies easy bruising or easy bleeding. Patient denies any recent infections, allergies or URI. Patient "s visual fields have not changed significantly in recent time.   PHYSICAL EXAM: BP (!) 141/92 (BP Location: Left Arm, Patient Position: Sitting, Cuff Size: Large) Comment: post procedure  Pulse 68    Temp (!) 97.4 F (36.3 C) (Tympanic)   Resp 16   Ht 6\' 1"  (1.854 m)   Wt 175 lb (79.4 kg)   BMI 23.09 kg/m  Well-developed well-nourished patient in NAD. HEENT reveals PERLA, EOMI, discs not visualized.  Oral cavity is clear. No oral mucosal lesions are identified. Neck is clear without evidence of cervical or supraclavicular adenopathy. Lungs are clear to A&P. Cardiac examination is essentially unremarkable with regular rate and rhythm without murmur rub or thrill. Abdomen is benign with no organomegaly or masses noted. Motor sensory and DTR levels are equal and symmetric in the upper and lower extremities. Cranial nerves II through XII are grossly intact. Proprioception is intact. No peripheral adenopathy or edema is identified. No motor or sensory levels are noted. Crude visual fields are within normal range.  LABORATORY DATA: Labs reviewed    RADIOLOGY RESULTS: PSMA PET scan and MRI scan reviewed ultrasound used today for volume study   IMPRESSION: Stage IIb Gleason 7 adenocarcinoma the prostate in 67 year old male for I-125 interstitial implant volume study today  PLAN: At this time patient is cleared to proceed with implant.  We performed volume study today showing excellent imaging and we will do our treatment planning based on  those images.  Risks and benefits of treatment including risk of general anesthesia radiation safety precautions increased lower urinary tract symptoms possible diarrhea fatigue all were reviewed in detail with the patient.  He comprehends my treatment plan well.  Patient is already scheduled for implant.    Carmina Miller, MD

## 2022-09-29 ENCOUNTER — Ambulatory Visit: Payer: Medicare HMO

## 2022-09-29 DIAGNOSIS — J301 Allergic rhinitis due to pollen: Secondary | ICD-10-CM | POA: Diagnosis not present

## 2022-10-06 DIAGNOSIS — J301 Allergic rhinitis due to pollen: Secondary | ICD-10-CM | POA: Diagnosis not present

## 2022-10-09 ENCOUNTER — Telehealth: Payer: Self-pay

## 2022-10-09 ENCOUNTER — Telehealth: Payer: Self-pay | Admitting: Nurse Practitioner

## 2022-10-09 NOTE — Telephone Encounter (Signed)
Contacted [name]. to schedule their annual wellness visit. Appointment made for 10/13/2022.  Verlee Rossetti; Care Guide Ambulatory Clinical Support [place] [facility] Direct Dial: [phone]

## 2022-10-09 NOTE — Telephone Encounter (Signed)
Pt called stating he has an appointment with you on 5/29 but is not sure he will be able to keep the appointment due to scheduled surgery he's having on 5/21. Pt wanted to confirm if he needs lab work for his upcoming appointment if he's able to keep appt.

## 2022-10-12 ENCOUNTER — Encounter: Payer: Self-pay | Admitting: Urology

## 2022-10-12 ENCOUNTER — Encounter
Admission: RE | Admit: 2022-10-12 | Discharge: 2022-10-12 | Disposition: A | Discharge status: Home / Self Care | Payer: Medicare HMO | Source: Ambulatory Visit | Attending: Urology | Admitting: Urology

## 2022-10-12 ENCOUNTER — Other Ambulatory Visit: Payer: Self-pay

## 2022-10-12 VITALS — Ht 73.0 in | Wt 165.0 lb

## 2022-10-12 DIAGNOSIS — I10 Essential (primary) hypertension: Secondary | ICD-10-CM

## 2022-10-12 HISTORY — DX: Thyrotoxicosis, unspecified without thyrotoxic crisis or storm: E05.90

## 2022-10-12 HISTORY — DX: Malignant (primary) neoplasm, unspecified: C80.1

## 2022-10-12 NOTE — Patient Instructions (Addendum)
Your procedure is scheduled on: Tuesday Oct 20, 2022. Report to the Registration Desk on the 1st floor of the [facility]. To find out your arrival time, please call [phone] between 1PM - 3PM on: Monday Oct 19, 2022. If your arrival time is 6:00 am, do not arrive before that time as the [facility] entrance doors do not open until 6:00 am.  REMEMBER: Instructions that are not followed completely may result in serious medical risk, up to and including death; or upon the discretion of your surgeon and anesthesiologist your surgery may need to be rescheduled.  Do not eat food or drink fluids after midnight the night before surgery.  No gum chewing or hard candies.   One week prior to surgery: Stop Anti-inflammatories (NSAIDS) such as Advil, Aleve, Ibuprofen, Motrin, Naproxen, Naprosyn and Aspirin based products such as Excedrin, Goody's Powder, BC Powder. Stop ALL OVER THE COUNTER supplements until after surgery. Aspirin Buf,CaCarb-MgCarb-MgO, 81 MG  Cyanocobalamin (VITAMIN B 12) 500 MCG  Turmeric (QC TUMERIC COMPLEX  Ascorbic Acid (VITAMIN C)  Saw Palmetto  VITAMIN D, ERGOCALCIFEROL Glucosamine-Chondroitin 1500-1200 MG  You may however, continue to take Tylenol if needed for pain up until the day of surgery.  Continue taking all prescribed medications with the exception of the following:  Follow recommendations from Cardiologist or PCP regarding stopping blood thinners.  TAKE ONLY THESE MEDICATIONS THE MORNING OF SURGERY WITH A SIP OF WATER:  amLODipine (NORVASC) 5 MG  methimazole (TAPAZOLE) 5 MG  relugolix (ORGOVYX) 120 MG    Use inhalers on the day of surgery and bring to the hospital. fluticasone-salmeterol (ADVAIR) 100-50 MCG/ACT AEPB   Fleets enema or bowel prep as directed (use the morning of surgery at 6 am)  No Alcohol for 24 hours before or after surgery.  No Smoking including e-cigarettes for 24 hours before surgery.  No chewable tobacco products for at  least 6 hours before surgery.  No nicotine patches on the day of surgery.  Do not use any "recreational" drugs for at least a week (preferably 2 weeks) before your surgery.  Please be advised that the combination of cocaine and anesthesia may have negative outcomes, up to and including death. If you test positive for cocaine, your surgery will be cancelled.  On the morning of surgery brush your teeth with toothpaste and water, you may rinse your mouth with mouthwash if you wish. Do not swallow any toothpaste or mouthwash.  Use CHG Soap or wipes as directed on instruction sheet.  Do not wear jewelry, make-up, hairpins, clips or nail polish.  Do not wear lotions, powders, or perfumes.   Do not shave body hair from the neck down 48 hours before surgery.  Contact lenses, hearing aids and dentures may not be worn into surgery.  Do not bring valuables to the hospital. [facility] is not responsible for any missing/lost belongings or valuables.   Notify your doctor if there is any change in your medical condition (cold, fever, infection).  Wear comfortable clothing (specific to your surgery type) to the hospital.  After surgery, you can help prevent lung complications by doing breathing exercises.  Take deep breaths and cough every 1-2 hours. Your doctor may order a device called an Incentive Spirometer to help you take deep breaths. When coughing or sneezing, hold a pillow firmly against your incision with both hands. This is called "splinting." Doing this helps protect your incision. It also decreases belly discomfort.  If you are being admitted to the  hospital overnight, leave your suitcase in the car. After surgery it may be brought to your room.  In case of increased patient census, it may be necessary for you, the patient, to continue your postoperative care in the Same Day Surgery department.  If you are being discharged the day of surgery, you will not be allowed to drive  home. You will need a responsible individual to drive you home and stay with you for 24 hours after surgery.   If you are taking public transportation, you will need to have a responsible individual with you.  Please call the Pre-admissions Testing Dept. at [phone] if you have any questions about these instructions.  Surgery Visitation Policy:  Patients having surgery or a procedure may have two visitors.  Children under the age of 28 must have an adult with them who is not the patient.  Inpatient Visitation:    Visiting hours are 7 a.m. to 8 p.m. Up to four visitors are allowed at one time in a patient room. The visitors may rotate out with other people during the day.  One visitor age 89 or older may stay with the patient overnight and must be in the room by 8 p.m.    Preparing for Surgery with CHLORHEXIDINE GLUCONATE (CHG) Soap  Chlorhexidine Gluconate (CHG) Soap  o An antiseptic cleaner that kills germs and bonds with the skin to continue killing germs even after washing  o Used for showering the night before surgery and morning of surgery  Before surgery, you can play an important role by reducing the number of germs on your skin.  CHG (Chlorhexidine gluconate) soap is an antiseptic cleanser which kills germs and bonds with the skin to continue killing germs even after washing.  Please do not use if you have an allergy to CHG or antibacterial soaps. If your skin becomes reddened/irritated stop using the CHG.  1. Shower the NIGHT BEFORE SURGERY and the MORNING OF SURGERY with CHG soap.  2. If you choose to wash your hair, wash your hair first as usual with your normal shampoo.  3. After shampooing, rinse your hair and body thoroughly to remove the shampoo.  4. Use CHG as you would any other liquid soap. You can apply CHG directly to the skin and wash gently with a scrungie or a clean washcloth.  5. Apply the CHG soap to your body only from the neck down. Do not use  on open wounds or open sores. Avoid contact with your eyes, ears, mouth, and genitals (private parts). Wash face and genitals (private parts) with your normal soap.  6. Wash thoroughly, paying special attention to the area where your surgery will be performed.  7. Thoroughly rinse your body with warm water.  8. Do not shower/wash with your normal soap after using and rinsing off the CHG soap.  9. Pat yourself dry with a clean towel.  10. Wear clean pajamas to bed the night before surgery.  12. Place clean sheets on your bed the night of your first shower and do not sleep with pets.  13. Shower again with the CHG soap on the day of surgery prior to arriving at the hospital.  14. Do not apply any deodorants/lotions/powders.  15. Please wear clean clothes to the hospital.

## 2022-10-13 ENCOUNTER — Ambulatory Visit (INDEPENDENT_AMBULATORY_CARE_PROVIDER_SITE_OTHER): Payer: Medicare HMO

## 2022-10-13 ENCOUNTER — Encounter
Admission: RE | Admit: 2022-10-13 | Discharge: 2022-10-13 | Disposition: A | Discharge status: Home / Self Care | Payer: Medicare HMO | Source: Ambulatory Visit | Attending: Urology | Admitting: Urology

## 2022-10-13 ENCOUNTER — Other Ambulatory Visit: Payer: Self-pay | Admitting: "Endocrinology

## 2022-10-13 VITALS — Ht 74.0 in | Wt 166.0 lb

## 2022-10-13 DIAGNOSIS — Z510 Encounter for antineoplastic radiation therapy: Secondary | ICD-10-CM | POA: Insufficient documentation

## 2022-10-13 DIAGNOSIS — J301 Allergic rhinitis due to pollen: Secondary | ICD-10-CM | POA: Diagnosis not present

## 2022-10-13 DIAGNOSIS — I10 Essential (primary) hypertension: Secondary | ICD-10-CM | POA: Diagnosis not present

## 2022-10-13 DIAGNOSIS — C61 Malignant neoplasm of prostate: Secondary | ICD-10-CM | POA: Diagnosis not present

## 2022-10-13 DIAGNOSIS — Z0000 Encounter for general adult medical examination without abnormal findings: Secondary | ICD-10-CM | POA: Diagnosis not present

## 2022-10-13 DIAGNOSIS — E782 Mixed hyperlipidemia: Secondary | ICD-10-CM

## 2022-10-13 DIAGNOSIS — E0590 Thyrotoxicosis, unspecified without thyrotoxic crisis or storm: Secondary | ICD-10-CM

## 2022-10-13 DIAGNOSIS — Z01810 Encounter for preprocedural cardiovascular examination: Secondary | ICD-10-CM | POA: Insufficient documentation

## 2022-10-13 NOTE — Progress Notes (Signed)
I connected with  Jonathan Roberson. on 10/13/22 by a audio enabled telemedicine application and verified that I am speaking with the correct person using two identifiers.  Patient Location: Home  Provider Location: Office/Clinic  I discussed the limitations of evaluation and management by telemedicine. The patient expressed understanding and agreed to proceed.  Subjective:   Jonathan Roberson. is a 67 y.o. male who presents for an Initial Medicare Annual Wellness Visit.  Review of Systems     Cardiac Risk Factors include: advanced age (>39men, >43 women);hypertension;male gender     Objective:    Today's Vitals   10/13/22 1431  PainSc: 0-No pain   There is no height or weight on file to calculate BMI.     10/13/2022    2:35 PM 10/12/2022   11:15 AM 09/23/2022    8:20 AM 05/13/2022    1:40 PM 08/14/2016    7:36 AM 09/24/2015    9:51 AM  Advanced Directives  Does Patient Have a Medical Advance Directive? No No No No No No  Would patient like information on creating a medical advance directive? No - Patient declined  No - Patient declined No - Patient declined Yes (MAU/Ambulatory/Procedural Areas - Information given) No - patient declined information    Current Medications (verified) Outpatient Encounter Medications as of 10/13/2022  Medication Sig   albuterol (PROVENTIL) (2.5 MG/3ML) 0.083% nebulizer solution USE 1 VIAL (3 ML) (2.5 MG TOTAL) VIA NEBULIZER EVERY 6 HOURS AS NEEDED FOR WHEEZING OR SHORTNESS OF BREATH   albuterol (VENTOLIN HFA) 108 (90 Base) MCG/ACT inhaler Inhale 2 puffs into the lungs every 6 (six) hours as needed for wheezing or shortness of breath.   amLODipine (NORVASC) 10 MG tablet Take 10 mg by mouth daily.   amLODipine (NORVASC) 5 MG tablet Take 10 mg by mouth daily.   Ascorbic Acid (VITAMIN C) 1000 MG tablet Take 1,000 mg by mouth daily.   Aspirin Buf,CaCarb-MgCarb-MgO, 81 MG TABS Take by mouth.   Cyanocobalamin (VITAMIN B 12) 500 MCG TABS Take 2 tablets  by mouth daily with lunch.   fluticasone (FLONASE) 50 MCG/ACT nasal spray PLACE 2 SPRAYS INTO BOTH NOSTRILS ONCE DAILY   fluticasone-salmeterol (ADVAIR) 100-50 MCG/ACT AEPB Inhale 1 puff into the lungs 2 (two) times daily.   Glucosamine-Chondroitin 1500-1200 MG/30ML LIQD Take by mouth.   lisinopril (ZESTRIL) 20 MG tablet Take 1 tablet (20 mg total) by mouth daily.   loratadine (CLARITIN) 10 MG tablet Take 10 mg by mouth daily as needed for allergies.   methimazole (TAPAZOLE) 5 MG tablet Take 1 tablet (5 mg total) by mouth daily. (Patient taking differently: Take 2.5 mg by mouth daily. Take 1/2 tablet of the 5 mg tablet)   Misc Natural Products (GLUCOSAMINE CHOND CMP DOUBLE PO) Take 1,500 mg by mouth daily. Chondrotin 1200 mg   NON FORMULARY Allergy injections weekly   relugolix (ORGOVYX) 120 MG tablet 3 tablets on first day and then 1 tablet daily.   Saw Palmetto 1000 MG CAPS Take by mouth.   Turmeric (QC TUMERIC COMPLEX PO) Take 900 mg by mouth daily.   VITAMIN D, ERGOCALCIFEROL, PO Take 1 tablet by mouth daily.   Zinc 100 MG TABS Take by mouth.   No facility-administered encounter medications on file as of 10/13/2022.    Allergies (verified) Losartan, Shrimp [shellfish allergy], Monosodium glutamate, Amoxicillin, Erythromycin, and Penicillins   History: Past Medical History:  Diagnosis Date   Allergy    Allergy to dust  Arthritis    hands, hips, knees   Asthma    Cancer (HCC)    Depression    Hyperlipidemia    Hypertension    Hyperthyroidism    Past Surgical History:  Procedure Laterality Date   BIOPSY N/A 08/14/2016   Procedure: BIOPSY;  Surgeon: Midge Minium, MD;  Location: [facility];  Service: Endoscopy;  Laterality: N/A;   COLONOSCOPY WITH PROPOFOL N/A 08/14/2016   Procedure: COLONOSCOPY WITH PROPOFOL;  Surgeon: Midge Minium, MD;  Location[facility];  Service: Endoscopy;  Laterality: N/A;   IMAGE GUIDED SINUS SURGERY Bilateral 09/24/2015   Procedure:  IMAGE GUIDED SINUS SURGERY Bilateral nasal polypectomy,maxillary antrostomy with tissue removal,bilateral ethmoidectomy,frontal sinusotomy;  Surgeon: Geanie Logan, MD;  Location: [facility];  Service: ENT;  Laterality: Bilateral;  GAVE DISK TO CE CE 4-12 KP   NASAL SINUS SURGERY  2005   Dr. Gertie Baron, [facility]   Family History  Problem Relation Age of Onset   Heart disease Mother    Depression Mother    Arthritis Sister    Emphysema Father    Depression Daughter    Social History   Socioeconomic History   Marital status: Married    Spouse name: Not on file   Number of children: Not on file   Years of education: Not on file   Highest education level: Not on file  Occupational History   Not on file  Tobacco Use   Smoking status: Never   Smokeless tobacco: Never  Vaping Use   Vaping Use: Never used  Substance and Sexual Activity   Alcohol use: Not Currently    Alcohol/week: 0.0 standard drinks of alcohol    Comment: on occasion- about once a year   Drug use: No   Sexual activity: Yes  Other Topics Concern   Not on file  Social History Narrative   Not on file   Social Determinants of Health   Financial Resource Strain: Low Risk  (10/13/2022)   Overall Financial Resource Strain (CARDIA)    Difficulty of Paying Living Expenses: Not hard at all  Food Insecurity: No Food Insecurity (10/13/2022)   Hunger Vital Sign    Worried About Running Out of Food in the Last Year: Never true    Ran Out of Food in the Last Year: Never true  Transportation Needs: No Transportation Needs (10/13/2022)   PRAPARE - Administrator, Civil Service (Medical): No    Lack of Transportation (Non-Medical): No  Physical Activity: Sufficiently Active (10/13/2022)   Exercise Vital Sign    Days of Exercise per Week: 7 days    Minutes of Exercise per Session: 60 min  Stress: Stress Concern Present (10/13/2022)   [employer] of Occupational Health - Occupational Stress  Questionnaire    Feeling of Stress : To some extent  Social Connections: Moderately Integrated (10/13/2022)   Social Connection and Isolation Panel [NHANES]    Frequency of Communication with Friends and Family: More than three times a week    Frequency of Social Gatherings with Friends and Family: Twice a week    Attends Religious Services: More than 4 times per year    Active Member of [employer] or Organizations: No    Attends Banker Meetings: Never    Marital Status: Married    Tobacco Counseling Counseling given: Not Answered   Clinical Intake:  Pre-visit preparation completed: Yes  Pain : No/denies pain Pain Score: 0-No pain     Nutritional  Risks: None Diabetes: No  How often do you need to have someone help you when you read instructions, pamphlets, or other written materials from your doctor or pharmacy?: 1 - Never  Diabetic?no  Interpreter Needed?: No  Information entered by :: Kennedy Bucker, LPN   Activities of Daily Living    10/13/2022    2:36 PM 10/12/2022   11:16 AM  In your present state of health, do you have any difficulty performing the following activities:  Hearing? 0   Vision? 0   Difficulty concentrating or making decisions? 0   Walking or climbing stairs? 0   Dressing or bathing? 0   Doing errands, shopping? 0 0  Preparing Food and eating ? N   Using the Toilet? N   In the past six months, have you accidently leaked urine? N   Do you have problems with loss of bowel control? N   Managing your Medications? N   Managing your Finances? N   Housekeeping or managing your Housekeeping? N     Patient Care Team: Marjie Skiff, NP as PCP - General (Nurse Practitioner)  Indicate any recent Medical Services you may have received from other than [facility] providers in the past year (date may be approximate).     Assessment:   This is a routine wellness examination for [facility].  Hearing/Vision screen Hearing Screening - Comments:: No  aids Vision Screening - Comments:: [facility]- [facility]  Dietary issues and exercise activities discussed: Current Exercise Habits: Home exercise routine, Type of exercise: walking, Time (Minutes): 60, Frequency (Times/Week): 7, Weekly Exercise (Minutes/Week): 420, Intensity: Mild   Goals Addressed             This Visit's Progress    DIET - EAT MORE FRUITS AND VEGETABLES         Depression Screen    10/13/2022    2:33 PM 07/30/2022   10:50 AM 01/28/2022   11:31 AM 08/20/2021    4:07 PM 12/23/2020    3:21 PM 06/24/2020    3:08 PM 06/11/2020   10:02 AM  PHQ 2/9 Scores  PHQ - 2 Score 1 2 0 0 0 0 0  PHQ- 9 Score 3 2 2 2  0 0 0    Fall Risk    10/13/2022    2:36 PM 07/30/2022   10:50 AM 01/28/2022   11:31 AM 08/20/2021    4:07 PM 06/24/2020    3:07 PM  Fall Risk   Falls in the past year? 0 0 0 0 0  Number falls in past yr: 0 0 0 0   Injury with Fall? 0 0 0 0   Risk for fall due to : No Fall Risks No Fall Risks No Fall Risks Post anesthesia;No Fall Risks   Follow up Falls prevention discussed;Falls evaluation completed Falls evaluation completed Falls evaluation completed Falls evaluation completed     FALL RISK PREVENTION PERTAINING TO THE HOME:  Any stairs in or around the home? No  If so, are there any without handrails? No  Home free of loose throw rugs in walkways, pet beds, electrical cords, etc? Yes  Adequate lighting in your home to reduce risk of falls? Yes   ASSISTIVE DEVICES UTILIZED TO PREVENT FALLS:  Life alert? No  Use of a cane, walker or w/c? No  Grab bars in the bathroom? No  Shower chair or bench in shower? No  Elevated toilet seat or a handicapped toilet? No   Cognitive Function:  10/13/2022    2:39 PM  6CIT Screen  What Year? 0 points  What month? 0 points  What time? 0 points  Count back from 20 2 points  Months in reverse 0 points  Repeat phrase 0 points  Total Score 2 points    Immunizations Immunization History  Administered  Date(s) Administered   Influenza,inj,Quad PF,6+ Mos 05/11/2018   Influenza-Unspecified 03/02/2016, 03/22/2017, 03/16/2019   PFIZER(Purple Top)SARS-COV-2 Vaccination 08/11/2019, 09/01/2019   PNEUMOCOCCAL CONJUGATE-20 01/28/2022   Pneumococcal Polysaccharide-23 04/18/2014   Tdap 04/07/2013    TDAP status: Up to date  Flu Vaccine status: Declined, Education has been provided regarding the importance of this vaccine but patient still declined. Advised may receive this vaccine at local pharmacy or Health Dept. Aware to provide a copy of the vaccination record if obtained from local pharmacy or Health Dept. Verbalized acceptance and understanding.  Pneumococcal vaccine status: Up to date  Covid-19 vaccine status: Completed vaccines  Qualifies for Shingles Vaccine? Yes   Zostavax completed No   Shingrix Completed?: No.    Education has been provided regarding the importance of this vaccine. Patient has been advised to call insurance company to determine out of pocket expense if they have not yet received this vaccine. Advised may also receive vaccine at local pharmacy or Health Dept. Verbalized acceptance and understanding.  Screening Tests Health Maintenance  Topic Date Due   Zoster Vaccines- Shingrix (1 of 2) Never done   COVID-19 Vaccine (3 - Pfizer risk series) 09/29/2019   COLONOSCOPY (Pts 45-38yrs Insurance coverage will need to be confirmed)  01/29/2023 (Originally 08/14/2021)   INFLUENZA VACCINE  12/31/2022   DTaP/Tdap/Td (2 - Td or Tdap) 04/08/2023   Medicare Annual Wellness (AWV)  10/13/2023   Pneumonia Vaccine 60+ Years old  Completed   Hepatitis C Screening  Completed   HPV VACCINES  Aged Out    Health Maintenance  Health Maintenance Due  Topic Date Due   Zoster Vaccines- Shingrix (1 of 2) Never done   COVID-19 Vaccine (3 - Pfizer risk series) 09/29/2019    Colorectal cancer screening: Type of screening: Colonoscopy. Completed 08/14/16. Repeat every 5 years- will make an  appointment once done w/ cancer txs  Lung Cancer Screening: (Low Dose CT Chest recommended if Age 63-80 years, 30 pack-year currently smoking OR have quit w/in 15years.) does not qualify.    Additional Screening:  Hepatitis C Screening: does qualify; Completed 04/29/15  Vision Screening: Recommended annual ophthalmology exams for early detection of glaucoma and other disorders of the eye. Is the patient up to date with their annual eye exam?  Yes  Who is the provider or what is the name of the office in which the patient attends annual eye exams? [facility] If pt is not established with a provider, would they like to be referred to a provider to establish care? No .   Dental Screening: Recommended annual dental exams for proper oral hygiene  Community Resource Referral / Chronic Care Management: CRR required this visit?  No   CCM required this visit?  No      Plan:     I have personally reviewed and noted the following in the patient's chart:   Medical and social history Use of alcohol, tobacco or illicit drugs  Current medications and supplements including opioid prescriptions. Patient is not currently taking opioid prescriptions. Functional ability and status Nutritional status Physical activity Advanced directives List of other physicians Hospitalizations, surgeries, and ER visits in previous 12 months Vitals  Screenings to include cognitive, depression, and falls Referrals and appointments  In addition, I have reviewed and discussed with patient certain preventive protocols, quality metrics, and best practice recommendations. A written personalized care plan for preventive services as well as general preventive health recommendations were provided to patient.     Hal Hope, LPN   [id number]/61/[id number]   Nurse Notes: none

## 2022-10-13 NOTE — Patient Instructions (Signed)
Jonathan Roberson , Thank you for taking time to come for your Medicare Wellness Visit. I appreciate your ongoing commitment to your health goals. Please review the following plan we discussed and let me know if I can assist you in the future.   These are the goals we discussed:  Goals      DIET - EAT MORE FRUITS AND VEGETABLES        This is a list of the screening recommended for you and due dates:  Health Maintenance  Topic Date Due   Zoster (Shingles) Vaccine (1 of 2) Never done   COVID-19 Vaccine (3 - Pfizer risk series) 09/29/2019   Colon Cancer Screening  01/29/2023*   Flu Shot  12/31/2022   DTaP/Tdap/Td vaccine (2 - Td or Tdap) 04/08/2023   Medicare Annual Wellness Visit  10/13/2023   Pneumonia Vaccine  Completed   Hepatitis C Screening: USPSTF Recommendation to screen - Ages 18-79 yo.  Completed   HPV Vaccine  Aged Out  *Topic was postponed. The date shown is not the original due date.    Advanced directives: no  Conditions/risks identified: none  Next appointment: Follow up in one year for your annual wellness visit. 10/19/23 @ 10:45 am by phone  Preventive Care 65 Years and Older, Male  Preventive care refers to lifestyle choices and visits with your health care provider that can promote health and wellness. What does preventive care include? A yearly physical exam. This is also called an annual well check. Dental exams once or twice a year. Routine eye exams. Ask your health care provider how often you should have your eyes checked. Personal lifestyle choices, including: Daily care of your teeth and gums. Regular physical activity. Eating a healthy diet. Avoiding tobacco and drug use. Limiting alcohol use. Practicing safe sex. Taking low doses of aspirin every day. Taking vitamin and mineral supplements as recommended by your health care provider. What happens during an annual well check? The services and screenings done by your health care provider during your  annual well check will depend on your age, overall health, lifestyle risk factors, and family history of disease. Counseling  Your health care provider may ask you questions about your: Alcohol use. Tobacco use. Drug use. Emotional well-being. Home and relationship well-being. Sexual activity. Eating habits. History of falls. Memory and ability to understand (cognition). Work and work Astronomer. Screening  You may have the following tests or measurements: Height, weight, and BMI. Blood pressure. Lipid and cholesterol levels. These may be checked every 5 years, or more frequently if you are over 41 years old. Skin check. Lung cancer screening. You may have this screening every year starting at age 93 if you have a 30-pack-year history of smoking and currently smoke or have quit within the past 15 years. Fecal occult blood test (FOBT) of the stool. You may have this test every year starting at age 29. Flexible sigmoidoscopy or colonoscopy. You may have a sigmoidoscopy every 5 years or a colonoscopy every 10 years starting at age 67. Prostate cancer screening. Recommendations will vary depending on your family history and other risks. Hepatitis C blood test. Hepatitis B blood test. Sexually transmitted disease (STD) testing. Diabetes screening. This is done by checking your blood sugar (glucose) after you have not eaten for a while (fasting). You may have this done every 1-3 years. Abdominal aortic aneurysm (AAA) screening. You may need this if you are a current or former smoker. Osteoporosis. You may be screened starting at  age 20 if you are at high risk. Talk with your health care provider about your test results, treatment options, and if necessary, the need for more tests. Vaccines  Your health care provider may recommend certain vaccines, such as: Influenza vaccine. This is recommended every year. Tetanus, diphtheria, and acellular pertussis (Tdap, Td) vaccine. You may need a Td  booster every 10 years. Zoster vaccine. You may need this after age 64. Pneumococcal 13-valent conjugate (PCV13) vaccine. One dose is recommended after age 35. Pneumococcal polysaccharide (PPSV23) vaccine. One dose is recommended after age 10. Talk to your health care provider about which screenings and vaccines you need and how often you need them. This information is not intended to replace advice given to you by your health care provider. Make sure you discuss any questions you have with your health care provider. Document Released: 06/14/2015 Document Revised: 02/05/2016 Document Reviewed: 03/19/2015 Elsevier Interactive Patient Education  2017 [place] Prevention in the Home Falls can cause injuries. They can happen to people of all ages. There are many things you can do to make your home safe and to help prevent falls. What can I do on the outside of my home? Regularly fix the edges of walkways and driveways and fix any cracks. Remove anything that might make you trip as you walk through a door, such as a raised step or threshold. Trim any bushes or trees on the path to your home. Use bright outdoor lighting. Clear any walking paths of anything that might make someone trip, such as rocks or tools. Regularly check to see if handrails are loose or broken. Make sure that both sides of any steps have handrails. Any raised decks and porches should have guardrails on the edges. Have any leaves, snow, or ice cleared regularly. Use sand or salt on walking paths during winter. Clean up any spills in your garage right away. This includes oil or grease spills. What can I do in the bathroom? Use night lights. Install grab bars by the toilet and in the tub and shower. Do not use towel bars as grab bars. Use non-skid mats or decals in the tub or shower. If you need to sit down in the shower, use a plastic, non-slip stool. Keep the floor dry. Clean up any water that spills on the floor  as soon as it happens. Remove soap buildup in the tub or shower regularly. Attach bath mats securely with double-sided non-slip rug tape. Do not have throw rugs and other things on the floor that can make you trip. What can I do in the bedroom? Use night lights. Make sure that you have a light by your bed that is easy to reach. Do not use any sheets or blankets that are too big for your bed. They should not hang down onto the floor. Have a firm chair that has side arms. You can use this for support while you get dressed. Do not have throw rugs and other things on the floor that can make you trip. What can I do in the kitchen? Clean up any spills right away. Avoid walking on wet floors. Keep items that you use a lot in easy-to-reach places. If you need to reach something above you, use a strong step stool that has a grab bar. Keep electrical cords out of the way. Do not use floor polish or wax that makes floors slippery. If you must use wax, use non-skid floor wax. Do not have throw rugs and  other things on the floor that can make you trip. What can I do with my stairs? Do not leave any items on the stairs. Make sure that there are handrails on both sides of the stairs and use them. Fix handrails that are broken or loose. Make sure that handrails are as long as the stairways. Check any carpeting to make sure that it is firmly attached to the stairs. Fix any carpet that is loose or worn. Avoid having throw rugs at the top or bottom of the stairs. If you do have throw rugs, attach them to the floor with carpet tape. Make sure that you have a light switch at the top of the stairs and the bottom of the stairs. If you do not have them, ask someone to add them for you. What else can I do to help prevent falls? Wear shoes that: Do not have high heels. Have rubber bottoms. Are comfortable and fit you well. Are closed at the toe. Do not wear sandals. If you use a stepladder: Make sure that it is  fully opened. Do not climb a closed stepladder. Make sure that both sides of the stepladder are locked into place. Ask someone to hold it for you, if possible. Clearly mark and make sure that you can see: Any grab bars or handrails. First and last steps. Where the edge of each step is. Use tools that help you move around (mobility aids) if they are needed. These include: Canes. Walkers. Scooters. Crutches. Turn on the lights when you go into a dark area. Replace any light bulbs as soon as they burn out. Set up your furniture so you have a clear path. Avoid moving your furniture around. If any of your floors are uneven, fix them. If there are any pets around you, be aware of where they are. Review your medicines with your doctor. Some medicines can make you feel dizzy. This can increase your chance of falling. Ask your doctor what other things that you can do to help prevent falls. This information is not intended to replace advice given to you by your health care provider. Make sure you discuss any questions you have with your health care provider. Document Released: 03/14/2009 Document Revised: 10/24/2015 Document Reviewed: 06/22/2014 Elsevier Interactive Patient Education  2017 [employer].

## 2022-10-14 NOTE — Telephone Encounter (Signed)
Pt made aware

## 2022-10-19 MED ORDER — FAMOTIDINE 20 MG PO TABS
20.0000 mg | ORAL_TABLET | Freq: Once | ORAL | Status: AC
  Administered 2022-10-20: 20 mg via ORAL

## 2022-10-19 MED ORDER — CHLORHEXIDINE GLUCONATE 0.12 % MT SOLN
15.0000 mL | Freq: Once | OROMUCOSAL | Status: AC
  Administered 2022-10-20: 15 mL via OROMUCOSAL

## 2022-10-19 MED ORDER — FLEET ENEMA 7-19 GM/118ML RE ENEM: 1.0000 | ENEMA | Freq: Once | RECTAL | Status: DC

## 2022-10-19 MED ORDER — LACTATED RINGERS IV SOLN: INTRAVENOUS | Status: DC

## 2022-10-19 MED ORDER — CEFAZOLIN SODIUM-DEXTROSE 2-4 GM/100ML-% IV SOLN
2.0000 g | Freq: Once | INTRAVENOUS | Status: AC
  Administered 2022-10-20: 2 g via INTRAVENOUS

## 2022-10-19 MED ORDER — ORAL CARE MOUTH RINSE: 15.0000 mL | Freq: Once | OROMUCOSAL | Status: AC

## 2022-10-20 ENCOUNTER — Ambulatory Visit: Payer: Medicare HMO | Admitting: Urgent Care

## 2022-10-20 ENCOUNTER — Ambulatory Visit: Payer: Medicare HMO | Attending: Radiation Oncology

## 2022-10-20 ENCOUNTER — Ambulatory Visit: Payer: Medicare HMO

## 2022-10-20 ENCOUNTER — Encounter: Payer: Self-pay | Admitting: Urology

## 2022-10-20 ENCOUNTER — Encounter
Admission: RE | Disposition: A | Discharge status: Home / Self Care | Payer: Self-pay | Source: Home / Self Care | Attending: Urology

## 2022-10-20 ENCOUNTER — Ambulatory Visit
Admission: RE | Admit: 2022-10-20 | Discharge: 2022-10-20 | Disposition: A | Discharge status: Home / Self Care | Payer: Medicare HMO | Attending: Urology | Admitting: Urology

## 2022-10-20 ENCOUNTER — Other Ambulatory Visit: Payer: Self-pay

## 2022-10-20 DIAGNOSIS — C61 Malignant neoplasm of prostate: Secondary | ICD-10-CM | POA: Insufficient documentation

## 2022-10-20 DIAGNOSIS — Z191 Hormone sensitive malignancy status: Secondary | ICD-10-CM | POA: Diagnosis not present

## 2022-10-20 HISTORY — PX: RADIOACTIVE SEED IMPLANT: SHX5150

## 2022-10-20 SURGERY — INSERTION, RADIATION SOURCE, PROSTATE
Anesthesia: General | Site: Prostate

## 2022-10-20 MED ORDER — FENTANYL CITRATE (PF) 100 MCG/2ML IJ SOLN
INTRAMUSCULAR | Status: DC | PRN
  Administered 2022-10-20 (×2): 50 ug via INTRAVENOUS

## 2022-10-20 MED ORDER — DEXMEDETOMIDINE HCL IN NACL 80 MCG/20ML IV SOLN
INTRAVENOUS | Status: DC | PRN
  Administered 2022-10-20: 8 ug via INTRAVENOUS

## 2022-10-20 MED ORDER — DEXAMETHASONE SODIUM PHOSPHATE 10 MG/ML IJ SOLN
INTRAMUSCULAR | Status: DC | PRN
  Administered 2022-10-20: 5 mg via INTRAVENOUS

## 2022-10-20 MED ORDER — PHENYLEPHRINE HCL-NACL 20-0.9 MG/250ML-% IV SOLN
INTRAVENOUS | Status: AC
  Filled 2022-10-20: qty 250

## 2022-10-20 MED ORDER — CHLORHEXIDINE GLUCONATE 0.12 % MT SOLN
OROMUCOSAL | Status: AC
  Filled 2022-10-20: qty 15

## 2022-10-20 MED ORDER — MIDAZOLAM HCL 2 MG/2ML IJ SOLN
INTRAMUSCULAR | Status: DC | PRN
  Administered 2022-10-20: 2 mg via INTRAVENOUS

## 2022-10-20 MED ORDER — FENTANYL CITRATE (PF) 100 MCG/2ML IJ SOLN
INTRAMUSCULAR | Status: AC
  Filled 2022-10-20: qty 2

## 2022-10-20 MED ORDER — ACETAMINOPHEN 10 MG/ML IV SOLN
INTRAVENOUS | Status: AC
  Filled 2022-10-20: qty 100

## 2022-10-20 MED ORDER — BACITRACIN 500 UNIT/GM EX OINT
TOPICAL_OINTMENT | CUTANEOUS | Status: DC | PRN
  Administered 2022-10-20: 1 via TOPICAL

## 2022-10-20 MED ORDER — PROPOFOL 1000 MG/100ML IV EMUL
INTRAVENOUS | Status: AC
  Filled 2022-10-20: qty 100

## 2022-10-20 MED ORDER — DROPERIDOL 2.5 MG/ML IJ SOLN: 0.6250 mg | Freq: Once | INTRAMUSCULAR | Status: DC | PRN

## 2022-10-20 MED ORDER — BACITRACIN ZINC 500 UNIT/GM EX OINT
TOPICAL_OINTMENT | CUTANEOUS | Status: AC
  Filled 2022-10-20: qty 28.35

## 2022-10-20 MED ORDER — CEFAZOLIN SODIUM-DEXTROSE 2-4 GM/100ML-% IV SOLN
INTRAVENOUS | Status: AC
  Filled 2022-10-20: qty 100

## 2022-10-20 MED ORDER — EPHEDRINE SULFATE (PRESSORS) 50 MG/ML IJ SOLN
INTRAMUSCULAR | Status: DC | PRN
  Administered 2022-10-20: 5 mg via INTRAVENOUS
  Administered 2022-10-20: 2.5 mg via INTRAVENOUS
  Administered 2022-10-20 (×3): 5 mg via INTRAVENOUS

## 2022-10-20 MED ORDER — LACTATED RINGERS IV SOLN: INTRAVENOUS | Status: DC | PRN

## 2022-10-20 MED ORDER — HYDROCODONE-ACETAMINOPHEN 5-325 MG PO TABS: 1.0000 | ORAL_TABLET | Freq: Four times a day (QID) | ORAL | 0 refills | Status: DC | PRN

## 2022-10-20 MED ORDER — PROPOFOL 10 MG/ML IV BOLUS
INTRAVENOUS | Status: AC
  Filled 2022-10-20: qty 40

## 2022-10-20 MED ORDER — PROPOFOL 10 MG/ML IV BOLUS
INTRAVENOUS | Status: DC | PRN
  Administered 2022-10-20: 50 mg via INTRAVENOUS
  Administered 2022-10-20: 150 mg via INTRAVENOUS

## 2022-10-20 MED ORDER — FENTANYL CITRATE (PF) 100 MCG/2ML IJ SOLN: 25.0000 ug | INTRAMUSCULAR | Status: DC | PRN

## 2022-10-20 MED ORDER — MIDAZOLAM HCL 2 MG/2ML IJ SOLN
INTRAMUSCULAR | Status: AC
  Filled 2022-10-20: qty 2

## 2022-10-20 MED ORDER — SODIUM CHLORIDE 0.9 % IR SOLN
Status: DC | PRN
  Administered 2022-10-20: 1000 mL

## 2022-10-20 MED ORDER — LIDOCAINE HCL (CARDIAC) PF 100 MG/5ML IV SOSY
PREFILLED_SYRINGE | INTRAVENOUS | Status: DC | PRN
  Administered 2022-10-20: 70 mg via INTRAVENOUS

## 2022-10-20 MED ORDER — ACETAMINOPHEN 10 MG/ML IV SOLN
INTRAVENOUS | Status: DC | PRN
  Administered 2022-10-20: 1000 mg via INTRAVENOUS

## 2022-10-20 MED ORDER — ONDANSETRON HCL 4 MG/2ML IJ SOLN
INTRAMUSCULAR | Status: DC | PRN
  Administered 2022-10-20: 4 mg via INTRAVENOUS

## 2022-10-20 MED ORDER — FAMOTIDINE 20 MG PO TABS
ORAL_TABLET | ORAL | Status: AC
  Filled 2022-10-20: qty 1

## 2022-10-20 SURGICAL SUPPLY — 22 items
BAG DRN RND TRDRP ANRFLXCHMBR (UROLOGICAL SUPPLIES) ×1
BAG URINE DRAIN 2000ML AR STRL (UROLOGICAL SUPPLIES) ×1 IMPLANT
BLADE CLIPPER SURG (BLADE) ×1 IMPLANT
CATH FOL 2WAY LX 16X5 (CATHETERS) ×1 IMPLANT
COVER BACK TABLE REUSABLE LG (DRAPES) ×1 IMPLANT
DRAPE INCISE 23X17 STRL (DRAPES) ×1 IMPLANT
DRAPE INCISE IOBAN 23X17 STRL (DRAPES) ×1 IMPLANT
DRAPE UNDER BUTTOCK W/FLU (DRAPES) ×1 IMPLANT
DRSG TELFA 3X8 NADH STRL (GAUZE/BANDAGES/DRESSINGS) ×1 IMPLANT
GLOVE BIO SURGEON STRL SZ7.5 (GLOVE) ×2 IMPLANT
GLOVE BIOGEL PI IND STRL 7.5 (GLOVE) ×1 IMPLANT
GOWN STRL REUS W/ TWL LRG LVL3 (GOWN DISPOSABLE) ×2 IMPLANT
GOWN STRL REUS W/ TWL XL LVL3 (GOWN DISPOSABLE) ×2 IMPLANT
GOWN STRL REUS W/TWL LRG LVL3 (GOWN DISPOSABLE) ×2
GOWN STRL REUS W/TWL XL LVL3 (GOWN DISPOSABLE) ×2
KIT TURNOVER CYSTO (KITS) ×1 IMPLANT
PACK CYSTO AR (MISCELLANEOUS) ×1 IMPLANT
SET CYSTO W/LG BORE CLAMP LF (SET/KITS/TRAYS/PACK) ×1 IMPLANT
SURGILUBE 2OZ TUBE FLIPTOP (MISCELLANEOUS) ×1 IMPLANT
SYR 10ML LL (SYRINGE) ×1 IMPLANT
WATER STERILE IRR 1000ML POUR (IV SOLUTION) ×1 IMPLANT
WATER STERILE IRR 500ML POUR (IV SOLUTION) ×1 IMPLANT

## 2022-10-20 NOTE — Transfer of Care (Signed)
Immediate Anesthesia Transfer of Care Note  Patient: Jonathan Roberson.  Procedure(s) Performed: RADIOACTIVE SEED IMPLANT/BRACHYTHERAPY IMPLANT (Prostate)  Patient Location: PACU  Anesthesia Type:General  Level of Consciousness: drowsy  Airway & Oxygen Therapy: Patient Spontanous Breathing and Patient connected to face mask oxygen  Post-op Assessment: Report given to RN  Post vital signs: stable  Last Vitals:  Vitals Value Taken Time  BP    Temp    Pulse    Resp    SpO2      Last Pain:  Vitals:   10/20/22 0615  TempSrc: Tympanic  PainSc: 0-No pain         Complications: No notable events documented.

## 2022-10-20 NOTE — Interval H&P Note (Signed)
History and Physical Interval Note:  CV:RRR Lungs:clear  10/20/2022 7:12 AM  [name].  has presented today for surgery, with the diagnosis of Prostate Cancer.  The various methods of treatment have been discussed with the patient and family. After consideration of risks, benefits and other options for treatment, the patient has consented to  Procedure(s): RADIOACTIVE SEED IMPLANT/BRACHYTHERAPY IMPLANT (N/A) as a surgical intervention.  The patient's history has been reviewed, patient examined, no change in status, stable for surgery.  I have reviewed the patient's chart and labs.  Questions were answered to the patient's satisfaction.     Yailin Biederman C Babbette Dalesandro

## 2022-10-20 NOTE — Op Note (Signed)
   Preoperative diagnosis: Adenocarcinoma of the prostate   Postoperative diagnosis: Adenocarcinoma the prostate  Procedure: I-125 prostate interstitial implant, cystoscopy  Surgeon: Irineo Axon, M.D.   Radiation oncologist: Joen Laura, M.D.   Anesthesia: General  Drains: none  Complications: none  Indications: Indication: Jonathan Roberson. is a 67 y.o. patient with intermediate risk prostate cancer.  After reviewing the management options for treatment, he elected to proceed with the above surgical procedure(s). We have discussed the potential benefits and risks of the procedure, side effects of the proposed treatment, the likelihood of the patient achieving the goals of the procedure, and any potential problems that might occur during the procedure or recuperation. Informed consent has been obtained.  Procedure:  The patient was taken to the operating room and general anesthesia was induced.  The patient was placed in the dorsal lithotomy position, prepped and draped in the usual sterile fashion, and preoperative antibiotics were administered. A preoperative time-out was performed.   Radiation oncology department placed a transrectal ultrasound probe anchoring stand/ grid and aligned with previous imaging from the volume study. Foley catheter was inserted without difficulty.  All needle passage was done with real-time transrectal ultrasound guidance in both the transverse and sagittal plains in order to achieve the desired preplanned position. A total of 24 needles were placed.  67 active seeds were implanted. The Foley catheter was removed and a rigid cystoscopy failed to show any seeds outside the prostate without evidence of trauma to the urethral, prostatic fossa, or bladder.  The bladder was drained.  A fluoroscopic image was then obtained showing excellent distrubution of the brachytherapy seeds.  Each seed was counted and counts were correct.    After anesthetic reversal he was  transported to the PACU in stable condition.  Plan: Void prior to discharge Postop follow-up 1 month   Irineo Axon, MD

## 2022-10-20 NOTE — Discharge Instructions (Addendum)
AMBULATORY SURGERY  ?DISCHARGE INSTRUCTIONS ? ? ?The drugs that you were given will stay in your system until tomorrow so for the next 24 hours you should not: ? ?Drive an automobile ?Make any legal decisions ?Drink any alcoholic beverage ? ? ?You may resume regular meals tomorrow.  Today it is better to start with liquids and gradually work up to solid foods. ? ?You may eat anything you prefer, but it is better to start with liquids, then soup and crackers, and gradually work up to solid foods. ? ? ?Please notify your doctor immediately if you have any unusual bleeding, trouble breathing, redness and pain at the surgery site, drainage, fever, or pain not relieved by medication. ? ? ? ?Additional Instructions: ? ? ? ?Please contact your physician with any problems or Same Day Surgery at [phone], Monday through Friday 6 am to 4 pm, or [facility] Main number at [phone].  ?

## 2022-10-20 NOTE — Anesthesia Preprocedure Evaluation (Signed)
Anesthesia Evaluation  Patient identified by MRN, date of birth, ID band Patient awake    Reviewed: Allergy & Precautions, H&P , NPO status , Patient's Chart, lab work & pertinent test results, reviewed documented beta blocker date and time   History of Anesthesia Complications Negative for: history of anesthetic complications  Airway Mallampati: I  TM Distance: >3 FB Neck ROM: full    Dental  (+) Dental Advidsory Given, Caps, Chipped, Poor Dentition   Pulmonary neg shortness of breath, asthma , neg sleep apnea, neg recent URI   Pulmonary exam normal breath sounds clear to auscultation       Cardiovascular Exercise Tolerance: Good hypertension, (-) angina (-) Past MI and (-) Cardiac Stents Normal cardiovascular exam(-) dysrhythmias (-) Valvular Problems/Murmurs Rhythm:regular Rate:Normal     Neuro/Psych  PSYCHIATRIC DISORDERS  Depression    negative neurological ROS     GI/Hepatic negative GI ROS, Neg liver ROS,,,  Endo/Other  neg diabetes Hyperthyroidism   Renal/GU negative Renal ROS  negative genitourinary   Musculoskeletal   Abdominal   Peds  Hematology negative hematology ROS (+)   Anesthesia Other Findings Past Medical History: No date: Allergy No date: Allergy to dust No date: Arthritis     Comment:  hands, hips, knees No date: Asthma No date: Cancer (HCC) No date: Depression No date: Hyperlipidemia No date: Hypertension No date: Hyperthyroidism   Reproductive/Obstetrics negative OB ROS                             Anesthesia Physical Anesthesia Plan  ASA: 2  Anesthesia Plan: General   Post-op Pain Management:    Induction: Intravenous  PONV Risk Score and Plan: 2 and Ondansetron, Dexamethasone and Treatment may vary due to age or medical condition  Airway Management Planned: LMA and Oral ETT  Additional Equipment:   Intra-op Plan:   Post-operative Plan:  Extubation in OR  Informed Consent: I have reviewed the patients History and Physical, chart, labs and discussed the procedure including the risks, benefits and alternatives for the proposed anesthesia with the patient or authorized representative who has indicated his/her understanding and acceptance.     Dental Advisory Given  Plan Discussed with: Anesthesiologist, CRNA and Surgeon  Anesthesia Plan Comments:        Anesthesia Quick Evaluation

## 2022-10-20 NOTE — Progress Notes (Signed)
Radiation Oncology Follow up Note  Name: Jonathan Roberson.   Date:   08/19/2022 MRN:  [id number] DOB: [birth year]    This 67 y.o. 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 provider found  HPI: Patient is a 67 year old male taken to the OR today for stage IIb Gleason 7 adenocarcinoma the prostate presenting with a PSA of 6..  COMPLICATIONS OF TREATMENT: none  FOLLOW UP COMPLIANCE: keeps appointments   PHYSICAL EXAM:  BP (!) 147/94   Pulse 84   Temp (!) 97.1 F (36.2 C) (Temporal)   Resp 16   Ht 6\' 2"  (1.88 m)   Wt 166 lb 0.1 oz (75.3 kg)   SpO2 96%   BMI 21.31 kg/m  Well-developed well-nourished patient in NAD. HEENT reveals PERLA, EOMI, discs not visualized.  Oral cavity is clear. No oral mucosal lesions are identified. Neck is clear without evidence of cervical or supraclavicular adenopathy. Lungs are clear to A&P. Cardiac examination is essentially unremarkable with regular rate and rhythm without murmur rub or thrill. Abdomen is benign with no organomegaly or masses noted. Motor sensory and DTR levels are equal and symmetric in the upper and lower extremities. Cranial nerves II through XII are grossly intact. Proprioception is intact. No peripheral adenopathy or edema is identified. No motor or sensory levels are noted. Crude visual fields are within normal range.  RADIOLOGY RESULTS: Ultrasound used for source placement  PLAN: Patient was taken to the operating room and general anesthesia was administered. Legs were immobilized in stirrups and patient was positioned in the exact same proportions as original volume study. Patient was prepped and Foley catheter was placed. Ultrasound guidance identified the prostate and recreated the original set up as per treatment planning volume study.  A needle grid was attached to the ultrasound probe to position the needles.  24 needles were  placed under ultrasound guidance  to the  prostate PTV  . After completion of procedure cystoscopy was performed by urology and no evidence of seeds in the bladder were noted. Patient tolerated the procedure extremely well. Initial plain film as doublecheck identified 66 seeds in the prostate. Patient has followup appointment in one month for CT scan for quality assurance will be performed.Prior to implant 10% or 10 loose seeds which is ever higher were ordered and assayed to verify source strength.     Carmina Miller, MD

## 2022-10-21 ENCOUNTER — Encounter: Payer: Self-pay | Admitting: Urology

## 2022-10-22 NOTE — Anesthesia Postprocedure Evaluation (Signed)
Anesthesia Post Note  Patient: Jonathan Roberson.  Procedure(s) Performed: RADIOACTIVE SEED IMPLANT/BRACHYTHERAPY IMPLANT (Prostate)  Patient location during evaluation: PACU Anesthesia Type: General Level of consciousness: awake and alert Pain management: pain level controlled Vital Signs Assessment: post-procedure vital signs reviewed and stable Respiratory status: spontaneous breathing, nonlabored ventilation, respiratory function stable and patient connected to nasal cannula oxygen Cardiovascular status: blood pressure returned to baseline and stable Postop Assessment: no apparent nausea or vomiting Anesthetic complications: no   No notable events documented.   Last Vitals:  Vitals:   10/20/22 0916 10/20/22 0925  BP: (!) 130/93 (!) 147/94  Pulse: 84 84  Resp: 12 16  Temp:  (!) 36.2 C  SpO2: 98% 96%    Last Pain:  Vitals:   10/20/22 0925  TempSrc: Temporal  PainSc: 0-No pain                 Lenard Simmer

## 2022-10-27 DIAGNOSIS — J301 Allergic rhinitis due to pollen: Secondary | ICD-10-CM | POA: Diagnosis not present

## 2022-10-27 DIAGNOSIS — E782 Mixed hyperlipidemia: Secondary | ICD-10-CM | POA: Diagnosis not present

## 2022-10-27 DIAGNOSIS — E0590 Thyrotoxicosis, unspecified without thyrotoxic crisis or storm: Secondary | ICD-10-CM | POA: Diagnosis not present

## 2022-10-28 ENCOUNTER — Ambulatory Visit: Payer: Medicare HMO | Admitting: "Endocrinology

## 2022-10-28 LAB — LIPID PANEL
Chol/HDL Ratio: 2.1 ratio (ref 0.0–5.0)
Cholesterol, Total: 186 mg/dL (ref 100–199)
HDL: 89 mg/dL (ref >39)
LDL Chol Calc (NIH): 84 mg/dL (ref 0–99)
Triglycerides: 72 mg/dL (ref 0–149)
VLDL Cholesterol Cal: 13 mg/dL (ref 5–40)

## 2022-10-28 LAB — TSH: TSH: 1.43 u[IU]/mL (ref 0.450–4.500)

## 2022-10-28 LAB — T4, FREE: Free T4: 1.26 ng/dL (ref 0.82–1.77)

## 2022-10-28 LAB — T3, FREE: T3, Free: 3.1 pg/mL (ref 2.0–4.4)

## 2022-11-02 ENCOUNTER — Other Ambulatory Visit: Payer: Self-pay | Admitting: "Endocrinology

## 2022-11-02 DIAGNOSIS — E0590 Thyrotoxicosis, unspecified without thyrotoxic crisis or storm: Secondary | ICD-10-CM

## 2022-11-03 DIAGNOSIS — J301 Allergic rhinitis due to pollen: Secondary | ICD-10-CM | POA: Diagnosis not present

## 2022-11-10 ENCOUNTER — Ambulatory Visit: Payer: Medicare HMO | Admitting: "Endocrinology

## 2022-11-10 ENCOUNTER — Encounter: Payer: Self-pay | Admitting: "Endocrinology

## 2022-11-10 VITALS — BP 122/82 | HR 72 | Ht 74.0 in | Wt 168.2 lb

## 2022-11-10 DIAGNOSIS — E782 Mixed hyperlipidemia: Secondary | ICD-10-CM | POA: Diagnosis not present

## 2022-11-10 DIAGNOSIS — E0590 Thyrotoxicosis, unspecified without thyrotoxic crisis or storm: Secondary | ICD-10-CM

## 2022-11-10 DIAGNOSIS — J301 Allergic rhinitis due to pollen: Secondary | ICD-10-CM | POA: Diagnosis not present

## 2022-11-10 MED ORDER — METHIMAZOLE 5 MG PO TABS: 2.5000 mg | ORAL_TABLET | Freq: Every day | ORAL | 1 refills | Status: DC

## 2022-11-10 NOTE — Progress Notes (Signed)
11/10/2022, 1:35 PM  Endocrinology follow-up note               Subjective:    Patient ID: Jonathan Roberson., male    DOB: [birth year], PCP Marjie Skiff, NP   Past Medical History:  Diagnosis Date   Allergy    Allergy to dust    Arthritis    hands, hips, knees   Asthma    Cancer (HCC)    Depression    Hyperlipidemia    Hypertension    Hyperthyroidism    Past Surgical History:  Procedure Laterality Date   BIOPSY N/A 08/14/2016   Procedure: BIOPSY;  Surgeon: Midge Minium, MD;  Location: [facility];  Service: Endoscopy;  Laterality: N/A;   COLONOSCOPY WITH PROPOFOL N/A 08/14/2016   Procedure: COLONOSCOPY WITH PROPOFOL;  Surgeon: Midge Minium, MD;  Location: [facility];  Service: Endoscopy;  Laterality: N/A;   IMAGE GUIDED SINUS SURGERY Bilateral 09/24/2015   Procedure: IMAGE GUIDED SINUS SURGERY Bilateral nasal polypectomy,maxillary antrostomy with tissue removal,bilateral ethmoidectomy,frontal sinusotomy;  Surgeon: Geanie Logan, MD;  Location: [facility];  Service: ENT;  Laterality: Bilateral;  GAVE DISK TO CE CE 4-12 KP   NASAL SINUS SURGERY  2005   Dr. Gertie Baron, [facility]   RADIOACTIVE SEED IMPLANT N/A 10/20/2022   Procedure: RADIOACTIVE SEED IMPLANT/BRACHYTHERAPY IMPLANT;  Surgeon: Riki Altes, MD;  Location: [facility] ORS;  Service: Urology;  Laterality: N/A;  67 seeds implanted   Social History   Socioeconomic History   Marital status: Married    Spouse name: Not on file   Number of children: Not on file   Years of education: Not on file   Highest education level: Not on file  Occupational History   Not on file  Tobacco Use   Smoking status: Never   Smokeless tobacco: Never  Vaping Use   Vaping Use: Never used  Substance and Sexual Activity   Alcohol use: Not Currently    Alcohol/week: 0.0 standard drinks of alcohol    Comment: on occasion- about once a year   Drug use: No    Sexual activity: Yes  Other Topics Concern   Not on file  Social History Narrative   Not on file   Social Determinants of Health   Financial Resource Strain: Low Risk  (10/13/2022)   Overall Financial Resource Strain (CARDIA)    Difficulty of Paying Living Expenses: Not hard at all  Food Insecurity: No Food Insecurity (10/13/2022)   Hunger Vital Sign    Worried About Running Out of Food in the Last Year: Never true    Ran Out of Food in the Last Year: Never true  Transportation Needs: No Transportation Needs (10/13/2022)   PRAPARE - Administrator, Civil Service (Medical): No    Lack of Transportation (Non-Medical): No  Physical Activity: Sufficiently Active (10/13/2022)   Exercise Vital Sign    Days of Exercise per Week: 7 days    Minutes of Exercise per Session: 60 min  Stress: Stress Concern Present (10/13/2022)   [employer] of Occupational Health - Occupational Stress Questionnaire    Feeling of Stress : To some extent  Social Connections: Moderately Integrated (  10/13/2022)   Social Connection and Isolation Panel [NHANES]    Frequency of Communication with Friends and Family: More than three times a week    Frequency of Social Gatherings with Friends and Family: Twice a week    Attends Religious Services: More than 4 times per year    Active Member of [employer] or Organizations: No    Attends Banker Meetings: Never    Marital Status: Married   Family History  Problem Relation Age of Onset   Heart disease Mother    Depression Mother    Arthritis Sister    Emphysema Father    Depression Daughter    Outpatient Encounter Medications as of 11/10/2022  Medication Sig   albuterol (PROVENTIL) (2.5 MG/3ML) 0.083% nebulizer solution USE 1 VIAL (3 ML) (2.5 MG TOTAL) VIA NEBULIZER EVERY 6 HOURS AS NEEDED FOR WHEEZING OR SHORTNESS OF BREATH   albuterol (VENTOLIN HFA) 108 (90 Base) MCG/ACT inhaler Inhale 2 puffs into the lungs every 6 (six) hours as needed for  wheezing or shortness of breath.   amLODipine (NORVASC) 10 MG tablet Take 10 mg by mouth daily.   amLODipine (NORVASC) 5 MG tablet Take 10 mg by mouth daily.   Ascorbic Acid (VITAMIN C) 1000 MG tablet Take 1,000 mg by mouth daily.   Aspirin Buf,CaCarb-MgCarb-MgO, 81 MG TABS Take by mouth.   Cyanocobalamin (VITAMIN B 12) 500 MCG TABS Take 2 tablets by mouth daily with lunch.   fluticasone (FLONASE) 50 MCG/ACT nasal spray PLACE 2 SPRAYS INTO BOTH NOSTRILS ONCE DAILY   fluticasone-salmeterol (ADVAIR) 100-50 MCG/ACT AEPB Inhale 1 puff into the lungs 2 (two) times daily.   Glucosamine-Chondroitin 1500-1200 MG/30ML LIQD Take by mouth.   HYDROcodone-acetaminophen (NORCO/VICODIN) 5-325 MG tablet Take 1 tablet by mouth every 6 (six) hours as needed for moderate pain.   lisinopril (ZESTRIL) 20 MG tablet Take 1 tablet (20 mg total) by mouth daily.   loratadine (CLARITIN) 10 MG tablet Take 10 mg by mouth daily as needed for allergies.   methimazole (TAPAZOLE) 5 MG tablet Take 0.5 tablets (2.5 mg total) by mouth daily.   Misc Natural Products (GLUCOSAMINE CHOND CMP DOUBLE PO) Take 1,500 mg by mouth daily. Chondrotin 1200 mg   NON FORMULARY Allergy injections weekly   relugolix (ORGOVYX) 120 MG tablet 3 tablets on first day and then 1 tablet daily.   Saw Palmetto 1000 MG CAPS Take by mouth.   Turmeric (QC TUMERIC COMPLEX PO) Take 900 mg by mouth daily.   VITAMIN D, ERGOCALCIFEROL, PO Take 1 tablet by mouth daily.   Zinc 100 MG TABS Take by mouth.   [DISCONTINUED] methimazole (TAPAZOLE) 5 MG tablet TAKE 1 TABLET BY MOUTH ONCE DAILY (Patient taking differently: Take 2.5 mg by mouth daily.)   No facility-administered encounter medications on file as of 11/10/2022.   ALLERGIES: Allergies  Allergen Reactions   Losartan Shortness Of Breath   Monosodium Glutamate     headaches   Amoxicillin Rash   Erythromycin Rash   Penicillins Rash    VACCINATION STATUS: Immunization History  Administered Date(s)  Administered   Influenza,inj,Quad PF,6+ Mos 05/11/2018   Influenza-Unspecified 03/02/2016, 03/22/2017, 03/16/2019   PFIZER(Purple Top)SARS-COV-2 Vaccination 08/11/2019, 09/01/2019   PNEUMOCOCCAL CONJUGATE-20 01/28/2022   Pneumococcal Polysaccharide-23 04/18/2014   Tdap 04/07/2013    HPI Crandall Ariano. is 67 y.o. male who presents today with a medical history as above. he is being seen in follow-up for hyperthyroidism.  His previsit labs show evidence of treatment  effect with improved thyroid profile, improved lipid profile and patient feeling better clinically.     He has no new complaints. His recent thyroid uptake and scan was unremarkable, did not require ablative treatment.  However, his thyroid function tests are indicative of mild Graves' disease with positive TPO antibodies.    He denies palpitations, heat intolerance.  Denies dysphagia, shortness of breath, nor voice change. He underwent thyroid ultrasound on March 02, 2022 which showed normal thyroid ultrasound. Specifically, no evidence of thyromegaly,  atrophy or discrete thyroid nodule or mass.  He is now on low-dose methimazole 5 mg p.o. daily.  He has family history of hypothyroidism in his sister.  No family history of thyroid malignancy. His other medical problems include hyperlipidemia and hypertension.  He is not on statins for now, presents with significant improvement in his lipid panel from whole food plant-based diet.   He is status post brachytherapy with radioactive seed implant to treat prostate cancer.  He is also on Orgovyx 120 mg p.o. daily.  He is a non-smoker, nonalcohol user.  Review of Systems    Objective:       11/10/2022   11:05 AM 10/20/2022    9:25 AM 10/20/2022    9:16 AM  Vitals with BMI  Height 6\' 2"     Weight 168 lbs 3 oz    BMI 21.59    Systolic 122 147 161  Diastolic 82 94 93  Pulse 72 84 84    BP 122/82   Pulse 72   Ht 6\' 2"  (1.88 m)   Wt 168 lb 3.2 oz (76.3 kg)   BMI  21.60 kg/m   Wt Readings from Last 3 Encounters:  11/10/22 168 lb 3.2 oz (76.3 kg)  10/20/22 166 lb 0.1 oz (75.3 kg)  10/13/22 166 lb (75.3 kg)    Physical Exam    CMP ( most recent) CMP     Component Value Date/Time   NA 134 08/21/2022 1022   K 4.3 08/21/2022 1022   CL 94 (L) 08/21/2022 1022   CO2 27 08/21/2022 1022   GLUCOSE 85 08/21/2022 1022   BUN 5 (L) 08/21/2022 1022   CREATININE 0.72 (L) 08/21/2022 1022   CALCIUM 9.5 08/21/2022 1022   PROT 7.8 08/21/2022 1022   ALBUMIN 4.4 08/21/2022 1022   AST 25 08/21/2022 1022   ALT 14 08/21/2022 1022   ALKPHOS 113 08/21/2022 1022   BILITOT 0.8 08/21/2022 1022   GFRNONAA 93 06/24/2020 1555   GFRAA 108 06/24/2020 1555       Lipid Panel ( most recent) Lipid Panel     Component Value Date/Time   CHOL 186 10/27/2022 1403   TRIG 72 10/27/2022 1403   HDL 89 10/27/2022 1403   CHOLHDL 2.1 10/27/2022 1403   LDLCALC 84 10/27/2022 1403   LABVLDL 13 10/27/2022 1403      Lab Results  Component Value Date   TSH 1.430 10/27/2022   TSH 1.420 08/21/2022   TSH 0.170 (L) 07/30/2022   TSH <0.005 (L) 05/11/2022   TSH 0.179 (L) 01/28/2022   TSH 0.173 (L) 12/23/2020   TSH 0.021 (L) 06/24/2020   TSH 0.675 06/19/2019   TSH 0.947 06/29/2017   TSH 0.686 06/24/2016   FREET4 1.26 10/27/2022   FREET4 1.17 08/21/2022   FREET4 1.17 07/30/2022   FREET4 1.78 (H) 05/11/2022   FREET4 1.40 12/23/2020    Reviewed ultrasound on March 02, 2022: Total right and left lobes measure 5.1 cm in greatest dimension.  Normal thyroid ultrasound. Specifically, no evidence of thyromegaly, atrophy or discrete thyroid nodule or mass.  Thyroid uptake and scan on May 05, 2022  FINDINGS: Homogeneous tracer distribution in both thyroid lobes.   No focal areas of increased or decreased tracer localization.   4 hour I-123 uptake = 8.7% (normal 5-20%),  24 hour I-123 uptake = 21.9% (normal 10-30%)   IMPRESSION: Normal exam.   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 Hyperlipidemia 3. Hypertension  Treyshaun did have mild hyperthyroidism which did not require ablative treatment with radioactive iodine.  He is benefiting from low-dose methimazole, advised to continue methimazole 2.5 mg p.o. daily before breakfast.     He has significantly improved  lipid panel , LDL stabilizing at 84, generally improving from 122.  This is without any statin intervention.  He is encouraged to continue on whole food plant-based diet with more fiber intake . He is encouraged to stay close to his urologist for the care of his prostate cancer.  Previous Thyroid ultrasound is unremarkable, will not need intervention for now. For better control of BP, he is advised to continue lisinopril at 20 mg p.o. daily (40 mg seems to be causing cough to him, and did not tolerate losartan in the past).  He is advised to continue amlodipine 5 mg p.o. daily at breakfast as well.     - he is advised to maintain close follow up with Marjie Skiff, NP for primary care needs.   I spent  22  minutes in the care of the patient today including review of labs from Thyroid Function, CMP, and other relevant labs ; imaging/biopsy records (current and previous including abstractions from other facilities); face-to-face time discussing  his lab results and symptoms, medications doses, his options of short and long term treatment based on the latest standards of care / guidelines;   and documenting the encounter.  Jonathan Roberson.  participated in the discussions, expressed understanding, and voiced agreement with the above plans.  All questions were answered to his satisfaction. he is encouraged to contact clinic should he have any questions or concerns prior to his return visit.    Follow up plan: Return in about 4 months (around 03/12/2023) for Fasting Labs  in AM B4 8.   Marquis Lunch, MD [facility] [address] [place], [place] [address] Phone: [phone]  Fax: [phone]     11/10/2022, 1:35 PM  This note was partially dictated with voice recognition software. Similar sounding words can be transcribed inadequately or may not  be corrected upon review.

## 2022-11-17 ENCOUNTER — Ambulatory Visit
Admission: RE | Admit: 2022-11-17 | Discharge: 2022-11-17 | Disposition: A | Discharge status: Home / Self Care | Payer: Medicare HMO | Source: Ambulatory Visit | Attending: Radiation Oncology | Admitting: Radiation Oncology

## 2022-11-17 ENCOUNTER — Encounter: Payer: Self-pay | Admitting: Radiation Oncology

## 2022-11-17 ENCOUNTER — Other Ambulatory Visit: Payer: Self-pay | Admitting: *Deleted

## 2022-11-17 VITALS — BP 140/94 | HR 79 | Temp 97.3°F | Resp 16 | Wt 168.0 lb

## 2022-11-17 DIAGNOSIS — R300 Dysuria: Secondary | ICD-10-CM | POA: Insufficient documentation

## 2022-11-17 DIAGNOSIS — Z923 Personal history of irradiation: Secondary | ICD-10-CM | POA: Insufficient documentation

## 2022-11-17 DIAGNOSIS — R351 Nocturia: Secondary | ICD-10-CM | POA: Diagnosis not present

## 2022-11-17 DIAGNOSIS — J301 Allergic rhinitis due to pollen: Secondary | ICD-10-CM | POA: Diagnosis not present

## 2022-11-17 DIAGNOSIS — C61 Malignant neoplasm of prostate: Secondary | ICD-10-CM | POA: Insufficient documentation

## 2022-11-17 DIAGNOSIS — Z191 Hormone sensitive malignancy status: Secondary | ICD-10-CM | POA: Diagnosis not present

## 2022-11-17 MED ORDER — TAMSULOSIN HCL 0.4 MG PO CAPS: 0.4000 mg | ORAL_CAPSULE | Freq: Every day | ORAL | 2 refills | Status: DC

## 2022-11-17 MED ORDER — TAMSULOSIN HCL 0.4 MG PO CAPS: 0.4000 mg | ORAL_CAPSULE | Freq: Every day | ORAL | 3 refills | Status: DC

## 2022-11-17 MED ORDER — TAMSULOSIN HCL 0.4 MG PO CAPS: 0.4000 mg | ORAL_CAPSULE | Freq: Every day | ORAL | 11 refills | Status: DC

## 2022-11-17 NOTE — Progress Notes (Signed)
Radiation Oncology Follow up Note  Name: Jonathan Roberson.   Date:   11/17/2022 MRN:  [id number] DOB: [birth year]    This 67 y.o. male presents to the clinic today for 1 month follow-up status post I-125 interstitial implant for Gleason 7 (3+4) adenocarcinoma the prostate presenting with a PSA in the 6 range.  REFERRING PROVIDER: Marjie Skiff, NP  HPI: Patient is a 67 year old male now out 1 month having completed I-125 interstitial implant for Gleason 7 adenocarcinoma the prostate.  Seen today in routine follow-up he is having some dysuria nocturia x 3.  Initially was having some slight diarrhea although that is improved.  He otherwise is without complaint.  We ran a CT scan for quality assurance today showing excellent source placement..  COMPLICATIONS OF TREATMENT: none  FOLLOW UP COMPLIANCE: keeps appointments   PHYSICAL EXAM:  BP (!) 140/94   Pulse 79   Temp (!) 97.3 F (36.3 C) (Tympanic)   Resp 16   Wt 168 lb (76.2 kg)   BMI 21.57 kg/m  Well-developed well-nourished patient in NAD. HEENT reveals PERLA, EOMI, discs not visualized.  Oral cavity is clear. No oral mucosal lesions are identified. Neck is clear without evidence of cervical or supraclavicular adenopathy. Lungs are clear to A&P. Cardiac examination is essentially unremarkable with regular rate and rhythm without murmur rub or thrill. Abdomen is benign with no organomegaly or masses noted. Motor sensory and DTR levels are equal and symmetric in the upper and lower extremities. Cranial nerves II through XII are grossly intact. Proprioception is intact. No peripheral adenopathy or edema is identified. No motor or sensory levels are noted. Crude visual fields are within normal range.  RADIOLOGY RESULTS: CT for quality assurance reviewed showing excellent source placement  PLAN: Present time patient has a low side effect profile.  I have assured him his side effects are because he is still radioactive from the implant  which will last another 1 month.  Have asked to see him back in 3 months for follow-up with a PSA at that time.  Patient is to call with any concerns.  I would like to take this opportunity to thank you for allowing me to participate in the care of your patient.Carmina Miller, MD

## 2022-11-18 DIAGNOSIS — C61 Malignant neoplasm of prostate: Secondary | ICD-10-CM | POA: Diagnosis not present

## 2022-11-20 ENCOUNTER — Ambulatory Visit: Payer: Medicare HMO | Admitting: Urology

## 2022-11-20 ENCOUNTER — Encounter: Payer: Self-pay | Admitting: Urology

## 2022-11-20 VITALS — BP 135/87 | HR 66 | Ht 74.0 in | Wt 165.0 lb

## 2022-11-20 DIAGNOSIS — C61 Malignant neoplasm of prostate: Secondary | ICD-10-CM

## 2022-11-20 NOTE — Progress Notes (Signed)
I, [name],acting as a scribe for Jonathan Altes, MD.,have documented all relevant documentation on the behalf of Jonathan Altes, MD,as directed by  Jonathan Altes, MD while in the presence of Jonathan Altes, MD.  11/20/2022 12:36 PM   Jonathan Roberson. Nov 25, 1955 [id number]  Referring provider: Marjie Skiff, NP [address] [place],  [place] [address]  Chief Complaint  Patient presents with   Follow-up   Urologic history: 1.  Prostate cancer cT2N0M0 unfavorable intermediate risk PSA 5.9 on 01/28/2022; DRE firm left prostate MRI 01/2022 PI-RADS 5 lesion left PZ; PI-RADS 4 lesion right PZ; volume 29 cc MR fusion biopsy [place] 03/24/2022; prostate volume 29 cc; 18 cores obtain; 2 ROI biopsies were positive for Gleason 3+4 adenocarcinoma in 1 Gleason 3+3 adenocarcinoma; 10/12 template biopsies were positive for Gleason 3+3/3+4/4+3 with tissue involvement ranging from 10-80% PSMA/PET 04/14/2022 showed no evidence of metastatic disease  HPI: Jonathan Roberson. is a 67 y.o. male presents for post-op follow-up.  Status post I-125 interstitial implant 10/20/22 for intermediate risk prostate cancer.  He did note significant perineal/scrotal ecchymosis post-op and was having fecal urgency, diarrhea, and lower urinary tract symptoms, all of which have improved.  Was seen in radiation oncology 6/18 and given Rx for Tamsulosin, however, he states his voiding symptoms have improved with cranberry juice and has elected not to start the Tamsulosin.  He also stopped his Orgovyx, secondary to side effects. He is scheduled for radiation oncology follow-up with PSA late September 2024. He has changed to a whole food/plant-based diet.   PMH: Past Medical History:  Diagnosis Date   Allergy    Allergy to dust    Arthritis    hands, hips, knees   Asthma    Cancer (HCC)    Depression    Hyperlipidemia    Hypertension    Hyperthyroidism     Surgical History: Past Surgical  History:  Procedure Laterality Date   BIOPSY N/A 08/14/2016   Procedure: BIOPSY;  Surgeon: Midge Minium, MD;  Location: [facility];  Service: Endoscopy;  Laterality: N/A;   COLONOSCOPY WITH PROPOFOL N/A 08/14/2016   Procedure: COLONOSCOPY WITH PROPOFOL;  Surgeon: Midge Minium, MD;  Location: [facility];  Service: Endoscopy;  Laterality: N/A;   IMAGE GUIDED SINUS SURGERY Bilateral 09/24/2015   Procedure: IMAGE GUIDED SINUS SURGERY Bilateral nasal polypectomy,maxillary antrostomy with tissue removal,bilateral ethmoidectomy,frontal sinusotomy;  Surgeon: Geanie Logan, MD;  Location: [facility];  Service: ENT;  Laterality: Bilateral;  GAVE DISK TO CE CE 4-12 KP   NASAL SINUS SURGERY  2005   Dr. Gertie Baron, [facility]   RADIOACTIVE SEED IMPLANT N/A 10/20/2022   Procedure: RADIOACTIVE SEED IMPLANT/BRACHYTHERAPY IMPLANT;  Surgeon: Jonathan Altes, MD;  Location: [facility];  Service: Urology;  Laterality: N/A;  67 seeds implanted    Home Medications:  Allergies as of 11/20/2022       Reactions   Losartan Shortness Of Breath   Monosodium Glutamate    headaches   Amoxicillin Rash   Erythromycin Rash   Penicillins Rash        Medication List        Accurate as of November 20, 2022 12:36 PM. If you have any questions, ask your nurse or doctor.          albuterol (2.5 MG/3ML) 0.083% nebulizer solution Commonly known as: PROVENTIL USE 1 VIAL (3 ML) (2.5 MG TOTAL) VIA NEBULIZER EVERY 6 HOURS AS NEEDED  FOR WHEEZING OR SHORTNESS OF BREATH   albuterol 108 (90 Base) MCG/ACT inhaler Commonly known as: VENTOLIN HFA Inhale 2 puffs into the lungs every 6 (six) hours as needed for wheezing or shortness of breath.   amLODipine 5 MG tablet Commonly known as: NORVASC Take 10 mg by mouth daily.   amLODipine 10 MG tablet Commonly known as: NORVASC Take 10 mg by mouth daily.   Aspirin Buf(CaCarb-MgCarb-MgO) 81 MG Tabs Take by mouth.   fluticasone 50 MCG/ACT nasal  spray Commonly known as: FLONASE PLACE 2 SPRAYS INTO BOTH NOSTRILS ONCE DAILY   fluticasone-salmeterol 100-50 MCG/ACT Aepb Commonly known as: ADVAIR Inhale 1 puff into the lungs 2 (two) times daily.   GLUCOSAMINE CHOND CMP DOUBLE PO Take 1,500 mg by mouth daily. Chondrotin 1200 mg   Glucosamine-Chondroitin 1500-1200 MG/30ML Liqd Take by mouth.   lisinopril 20 MG tablet Commonly known as: ZESTRIL Take 1 tablet (20 mg total) by mouth daily.   loratadine 10 MG tablet Commonly known as: CLARITIN Take 10 mg by mouth daily as needed for allergies.   methimazole 5 MG tablet Commonly known as: TAPAZOLE Take 0.5 tablets (2.5 mg total) by mouth daily.   NON FORMULARY Allergy injections weekly   Orgovyx 120 MG tablet Generic drug: relugolix 3 tablets on first day and then 1 tablet daily.   QC TUMERIC COMPLEX PO Take 900 mg by mouth daily.   Saw Palmetto 1000 MG Caps Take by mouth.   tamsulosin 0.4 MG Caps capsule Commonly known as: FLOMAX Take 1 capsule (0.4 mg total) by mouth daily after supper.   tamsulosin 0.4 MG Caps capsule Commonly known as: FLOMAX Take 1 capsule (0.4 mg total) by mouth daily after supper.   Vitamin B 12 500 MCG Tabs Take 2 tablets by mouth daily with lunch.   vitamin C 1000 MG tablet Take 1,000 mg by mouth daily.   VITAMIN D (ERGOCALCIFEROL) PO Take 1 tablet by mouth daily.   Zinc 100 MG Tabs Take by mouth.        Allergies:  Allergies  Allergen Reactions   Losartan Shortness Of Breath   Monosodium Glutamate     headaches   Amoxicillin Rash   Erythromycin Rash   Penicillins Rash    Family History: Family History  Problem Relation Age of Onset   Heart disease Mother    Depression Mother    Arthritis Sister    Emphysema Father    Depression Daughter     Social History:  reports that he has never smoked. He has never used smokeless tobacco. He reports that he does not currently use alcohol. He reports that he does not use  drugs.   Physical Exam: BP 135/87   Pulse 66   Ht 6\' 2"  (1.88 m)   Wt 165 lb (74.8 kg)   BMI 21.18 kg/m   Constitutional:  Alert and oriented, No acute distress. HEENT: [place] AT Respiratory: Normal respiratory effort, no increased work of breathing. Psychiatric: Normal mood and affect.   Assessment & Plan:    1. Intermediate risk prostate cancer T2N0M0 Status post I-125 interstitial implant Scheduled for post-implant PSA, September 2024 with radiation oncology Follow-up with me December 2024 with PSA prior  I have reviewed the above documentation for accuracy and completeness, and I agree with the above.   Jonathan Altes, MD  [facility] [address] [place], [place] [address] [phone]

## 2022-11-24 DIAGNOSIS — J301 Allergic rhinitis due to pollen: Secondary | ICD-10-CM | POA: Diagnosis not present

## 2022-11-25 DIAGNOSIS — J301 Allergic rhinitis due to pollen: Secondary | ICD-10-CM | POA: Diagnosis not present

## 2022-12-01 DIAGNOSIS — J301 Allergic rhinitis due to pollen: Secondary | ICD-10-CM | POA: Diagnosis not present

## 2022-12-08 DIAGNOSIS — J301 Allergic rhinitis due to pollen: Secondary | ICD-10-CM | POA: Diagnosis not present

## 2022-12-15 DIAGNOSIS — J301 Allergic rhinitis due to pollen: Secondary | ICD-10-CM | POA: Diagnosis not present

## 2022-12-22 DIAGNOSIS — J301 Allergic rhinitis due to pollen: Secondary | ICD-10-CM | POA: Diagnosis not present

## 2022-12-29 DIAGNOSIS — J301 Allergic rhinitis due to pollen: Secondary | ICD-10-CM | POA: Diagnosis not present

## 2023-01-05 DIAGNOSIS — J301 Allergic rhinitis due to pollen: Secondary | ICD-10-CM | POA: Diagnosis not present

## 2023-01-12 DIAGNOSIS — J301 Allergic rhinitis due to pollen: Secondary | ICD-10-CM | POA: Diagnosis not present

## 2023-01-19 DIAGNOSIS — J301 Allergic rhinitis due to pollen: Secondary | ICD-10-CM | POA: Diagnosis not present

## 2023-01-24 NOTE — Patient Instructions (Signed)
Be Involved in Caring For Your Health:  Taking Medications When medications are taken as directed, they can greatly improve your health. But if they are not taken as prescribed, they may not work. In some cases, not taking them correctly can be harmful. To help ensure your treatment remains effective and safe, understand your medications and how to take them. Bring your medications to each visit for review by your provider.  Your lab results, notes, and after visit summary will be available on My Chart. We strongly encourage you to use this feature. If lab results are abnormal the clinic will contact you with the appropriate steps. If the clinic does not contact you assume the results are satisfactory. You can always view your results on My Chart. If you have questions regarding your health or results, please contact the clinic during office hours. You can also ask questions on My Chart.  We at [facility] are grateful that you chose [place] to provide your care. We strive to provide evidence-based and compassionate care and are always looking for feedback. If you get a survey from the clinic please complete this so we can hear your opinions.  Hyperthyroidism Hyperthyroidism refers to a thyroid gland that is too active or overactive. The thyroid gland is a small gland located in the lower front part of the neck, just in front of the windpipe (trachea). This gland makes hormones that: Help control how the body uses food for energy (metabolism). Help the heart and brain work well. Keep your bones strong. When the thyroid is overactive, it produces too much of a hormone called thyroxine. What are the causes? This condition may be caused by: Graves' disease. This is a disorder in which the body's disease-fighting system (immune system) attacks the thyroid gland. This is the most common cause. Inflammation of the thyroid gland. A tumor in the thyroid gland. Use of certain medicines,  including: Prescription thyroid hormone replacement. Herbal supplements that mimic thyroid hormones. Amiodarone therapy. Solid or fluid-filled lumps within your thyroid gland (thyroid nodules). Taking in a large amount of iodine from foods or medicines. What increases the risk? You are more likely to develop this condition if: You are male. You have a family history of thyroid conditions. You smoke tobacco. You use a medicine called lithium. You take medicines that affect the immune system (immunosuppressants). What are the signs or symptoms? Symptoms of this condition include: Nervousness. Inability to tolerate heat. Diarrhea. Rapid heart rate. Shaky hands. Restlessness. Sleep problems. Other symptoms may include: Heart skipping beats or making extra beats. Unexplained weight loss. Change in the texture of hair or skin. Loss of menstruation. Fatigue. Enlarged thyroid gland or a lump in the thyroid (nodule). You may also have symptoms of Graves' disease, which may include: Protruding eyes. Dry eyes. Red or swollen eyes. Problems with vision. How is this diagnosed? This condition may be diagnosed based on: Your symptoms and medical history. A physical exam. Blood tests. Thyroid ultrasound. This test involves using sound waves to produce images of the thyroid gland. A thyroid scan. A radioactive substance is injected into a vein, and images show how much iodine is present in the thyroid. Radioactive iodine uptake test (RAIU). A small amount of radioactive iodine is given by mouth to see how much iodine the thyroid absorbs after a certain amount of time. How is this treated? Treatment depends on the cause and severity of the condition. Treatment may include: Medicines to reduce the amount of thyroid hormone your  body makes. Medicines to help manage your symptoms. Radioactive iodine treatment (radioiodine therapy). This involves swallowing a small dose of radioactive  iodine, in capsule or liquid form, to kill thyroid cells. Surgery to remove part or all of your thyroid gland. You may need to take thyroid hormone replacement medicine for the rest of your life after thyroid surgery. Follow these instructions at home:  Take over-the-counter and prescription medicines only as told by your health care provider. Do not use any products that contain nicotine or tobacco. These products include cigarettes, chewing tobacco, and vaping devices, such as e-cigarettes. If you need help quitting, ask your health care provider. Follow any instructions from your health care provider about diet. You may be instructed to limit foods that contain iodine. Keep all follow-up visits. You will need to have blood tests regularly so that your health care provider can monitor your condition. Where to find more information [employer] of Diabetes and Digestive and Kidney Diseases: [email or web address] Contact a health care provider if: Your symptoms do not get better with treatment. You have a fever. You have abdominal pain. You feel dizzy. You are taking thyroid hormone replacement medicine and: You have symptoms of depression. You feel like you are tired all the time. You gain weight. Get help right away if: You have sudden, unexplained confusion or other mental changes. You have chest pain. You have fast or irregular heartbeats (palpitations). You have difficulty breathing. These symptoms may be an emergency. Get help right away. Call 911. Do not wait to see if the symptoms will go away. Do not drive yourself to the hospital. Summary The thyroid gland is a small gland located in the lower front part of the neck, just in front of the windpipe. Hyperthyroidism is when the thyroid gland is too active and produces too much of a hormone called thyroxine. The most common cause is Graves' disease, a disorder in which your immune system attacks the thyroid gland. Hyperthyroidism  can cause various symptoms, such as unexplained weight loss, nervousness, inability to tolerate heat, or changes in your heartbeat. Treatment may include medicine to reduce the amount of thyroid hormone your body makes, radioiodine therapy, surgery, or medicines to manage symptoms. This information is not intended to replace advice given to you by your health care provider. Make sure you discuss any questions you have with your health care provider. Document Revised: 07/11/2021 Document Reviewed: 07/11/2021 Elsevier Patient Education  2024 [employer].

## 2023-01-26 DIAGNOSIS — J301 Allergic rhinitis due to pollen: Secondary | ICD-10-CM | POA: Diagnosis not present

## 2023-01-28 ENCOUNTER — Encounter: Payer: Self-pay | Admitting: Nurse Practitioner

## 2023-01-28 ENCOUNTER — Ambulatory Visit (INDEPENDENT_AMBULATORY_CARE_PROVIDER_SITE_OTHER): Payer: Medicare HMO | Admitting: Nurse Practitioner

## 2023-01-28 VITALS — BP 128/73 | HR 68 | Ht 74.0 in | Wt 165.4 lb

## 2023-01-28 DIAGNOSIS — F3342 Major depressive disorder, recurrent, in full remission: Secondary | ICD-10-CM

## 2023-01-28 DIAGNOSIS — J4540 Moderate persistent asthma, uncomplicated: Secondary | ICD-10-CM | POA: Diagnosis not present

## 2023-01-28 DIAGNOSIS — E0590 Thyrotoxicosis, unspecified without thyrotoxic crisis or storm: Secondary | ICD-10-CM

## 2023-01-28 DIAGNOSIS — E7800 Pure hypercholesterolemia, unspecified: Secondary | ICD-10-CM

## 2023-01-28 DIAGNOSIS — E559 Vitamin D deficiency, unspecified: Secondary | ICD-10-CM | POA: Diagnosis not present

## 2023-01-28 DIAGNOSIS — E871 Hypo-osmolality and hyponatremia: Secondary | ICD-10-CM | POA: Diagnosis not present

## 2023-01-28 DIAGNOSIS — I10 Essential (primary) hypertension: Secondary | ICD-10-CM

## 2023-01-28 DIAGNOSIS — C61 Malignant neoplasm of prostate: Secondary | ICD-10-CM | POA: Diagnosis not present

## 2023-01-28 NOTE — Assessment & Plan Note (Signed)
Chronic, ongoing.  He refuses statin at this time and wishes to continue to focus on lifestyle changes + current diet changes.  Discussed risk with current ASCVD score and recommendations for statin.  Will recheck lipid panel today.

## 2023-01-28 NOTE — Progress Notes (Signed)
BP 128/73   Pulse 68   Ht 6\' 2"  (1.88 m)   Wt 165 lb 6.4 oz (75 kg)   SpO2 99%   BMI 21.24 kg/m    Subjective:    Patient ID: Jonathan Roberson., male    DOB: September 19, 1955, 67 y.o.   MRN: [id number]  HPI: Jonathan Roberson. is a 67 y.o. male  Chief Complaint  Patient presents with   Hyperlipidemia   Hypertension    Patient says he has noticed some dizzy spells when he bends over and says he can tell it is different. Patient says it may have been the blood pressure medication. Patient would like to discuss dose.   Prostate Cancer    Patient says he would like his PSA markers checked at today's visit.    Hyperthyroidism   Mood   Vitamin D     Patient says he would like to have his Vitamin D levels checked.    HYPERTENSION / HYPERLIPIDEMIA Currently taking Amlodipine 10 MG & Lisinopril 20 MG daily - refuses statin. Satisfied with current treatment? yes Duration of hypertension: chronic BP monitoring frequency: is not checking BP range:  BP medication side effects: no Duration of hyperlipidemia: chronic Cholesterol supplements: none Aspirin: no Recent stressors: no Recurrent headaches: no Visual changes: no Palpitations: no Dyspnea: no Chest pain: no Lower extremity edema: no Dizzy/lightheaded:  when leans over to get dirty clothes  HYPERTHYROIDISM Follows with endocrinology, last visit 11/10/22 & ENT 11/25/22.  Continues Methimazole.  Symptoms are improving. Thyroid control status:stable Satisfied with current treatment? yes Medication side effects: no Medication compliance: good compliance Fatigue: no Cold intolerance: no Heat intolerance: no Weight gain: no Weight loss: no Constipation: no Diarrhea/loose stools:  occasional Palpitations: no Lower extremity edema: no Anxiety/depressed mood: no   PROSTATE CANCER Had 1-125 interstitial implant 10/20/22.  Last visit with urology 11/20/22 & oncology 11/17/22. Duration: months Nocturia: often gets up -- but  difficulty coming out Urinary frequency:no Incomplete voiding: no Urgency: no Weak urinary stream: yes Straining to start stream: yes  DEPRESSION No current medications -- he weaned self off Paxil. Would like Vitamin D checked today. Mood status: stable Satisfied with current treatment?: yes Symptom severity: moderate  Duration of current treatment : chronic Side effects: no Medication compliance: good compliance Depressed mood: occasional Anxious mood: occasional Anhedonia: no Significant weight loss or gain: no Insomnia: occasional Fatigue: no Feelings of worthlessness or guilt: no Impaired concentration/indecisiveness: no Suicidal ideations: no Hopelessness: no Crying spells: no    01/28/2023   10:28 AM 10/13/2022    2:33 PM 07/30/2022   10:50 AM 01/28/2022   11:31 AM 08/20/2021    4:07 PM  Depression screen PHQ 2/9  Decreased Interest 1  1 0 0  Down, Depressed, Hopeless 1 1 1  0 0  PHQ - 2 Score 2 1 2  0 0  Altered sleeping 1 1 0 0 0  Tired, decreased energy 0 1 0 1 2  Change in appetite 0  0  0  Feeling bad or failure about yourself  0  0 0 0  Trouble concentrating 0  0 0 0  Moving slowly or fidgety/restless 0  0 1 0  Suicidal thoughts 0  0 0 0  PHQ-9 Score 3 3 2 2 2   Difficult doing work/chores Somewhat difficult  Somewhat difficult Not difficult at all Not difficult at all       01/28/2023   10:29 AM 07/30/2022   10:50 AM  01/28/2022   11:31 AM 08/20/2021    4:08 PM  GAD 7 : Generalized Anxiety Score  Nervous, Anxious, on Edge 1 0 0 0  Control/stop worrying 1 0 0 0  Worry too much - different things 0 0 0 0  Trouble relaxing 0 0 0 0  Restless 0 0 1 0  Easily annoyed or irritable 1 0 0 0  Afraid - awful might happen 0 0 0 0  Total GAD 7 Score 3 0 1 0  Anxiety Difficulty Somewhat difficult Not difficult at all Not difficult at all Not difficult at all   Relevant past medical, surgical, family and social history reviewed and updated as indicated. Interim  medical history since our last visit reviewed. Allergies and medications reviewed and updated.  Review of Systems  Constitutional:  Negative for activity change, diaphoresis, fatigue and fever.  Respiratory:  Negative for cough, chest tightness, shortness of breath and wheezing.   Cardiovascular:  Negative for chest pain, palpitations and leg swelling.  Gastrointestinal: Negative.   Endocrine: Negative for cold intolerance and heat intolerance.  Neurological:  Positive for dizziness. Negative for syncope, weakness, light-headedness, numbness and headaches.  Psychiatric/Behavioral: Negative.      Per HPI unless specifically indicated above     Objective:    BP 128/73   Pulse 68   Ht 6\' 2"  (1.88 m)   Wt 165 lb 6.4 oz (75 kg)   SpO2 99%   BMI 21.24 kg/m   Wt Readings from Last 3 Encounters:  01/28/23 165 lb 6.4 oz (75 kg)  11/20/22 165 lb (74.8 kg)  11/17/22 168 lb (76.2 kg)    Physical Exam Vitals and nursing note reviewed.  Constitutional:      General: He is awake. He is not in acute distress.    Appearance: He is well-developed and well-groomed. He is not ill-appearing or toxic-appearing.  HENT:     Head: Normocephalic.  Eyes:     General: Lids are normal.     Extraocular Movements: Extraocular movements intact.     Conjunctiva/sclera: Conjunctivae normal.  Neck:     Thyroid: No thyromegaly.     Vascular: No carotid bruit.  Cardiovascular:     Rate and Rhythm: Normal rate and regular rhythm.     Heart sounds: Normal heart sounds.  Pulmonary:     Effort: No accessory muscle usage or respiratory distress.     Breath sounds: Normal breath sounds.  Abdominal:     General: Bowel sounds are normal. There is no distension.     Palpations: Abdomen is soft.     Tenderness: There is no abdominal tenderness.  Musculoskeletal:     Cervical back: Full passive range of motion without pain.     Right lower leg: No edema.     Left lower leg: No edema.  Lymphadenopathy:      Cervical: No cervical adenopathy.  Skin:    General: Skin is warm.     Capillary Refill: Capillary refill takes less than 2 seconds.  Neurological:     Mental Status: He is alert and oriented to person, place, and time.     Deep Tendon Reflexes: Reflexes are normal and symmetric.     Reflex Scores:      Brachioradialis reflexes are 2+ on the right side and 2+ on the left side.      Patellar reflexes are 2+ on the right side and 2+ on the left side. Psychiatric:  Attention and Perception: Attention normal.        Mood and Affect: Mood normal.        Speech: Speech normal.        Behavior: Behavior normal. Behavior is cooperative.        Thought Content: Thought content normal.    Results for orders placed or performed in visit on 10/13/22  TSH  Result Value Ref Range   TSH 1.430 0.450 - 4.500 uIU/mL  T4, free  Result Value Ref Range   Free T4 1.26 0.82 - 1.77 ng/dL  T3, free  Result Value Ref Range   T3, Free 3.1 2.0 - 4.4 pg/mL  Lipid panel  Result Value Ref Range   Cholesterol, Total 186 100 - 199 mg/dL   Triglycerides 72 0 - 149 mg/dL   HDL 89 >08 mg/dL   VLDL Cholesterol Cal 13 5 - 40 mg/dL   LDL Chol Calc (NIH) 84 0 - 99 mg/dL   Chol/HDL Ratio 2.1 0.0 - 5.0 ratio      Assessment & Plan:   Problem List Items Addressed This Visit       Cardiovascular and Mediastinum   Essential hypertension, benign    Chronic, ongoing.  BP at goal in office today, may be able to reduce medications in future.  Recommend she monitor BP at least a few mornings a week at home and document.  DASH diet at home.  Continue current medication regimen and adjust as needed.  Labs today: CBC, CMP.       Relevant Orders   CBC with Differential/Platelet   Comprehensive metabolic panel     Endocrine   Hyperthyroidism    Diagnosed 05/08/22.  Followed by endocrinology.  Continue current medication regimen as ordered by them.  Recent notes reviewed.  Thyroid labs up to date.       Relevant Orders   VITAMIN D 25 Hydroxy (Vit-D Deficiency, Fractures)     Genitourinary   Prostate cancer (HCC)    Diagnosed 04/14/22, followed by urology. Recent notes reviewed from urology and oncology.  Has been very diet focused.  PSA today per his request.      Relevant Orders   PSA, total and free     Other   Hyperlipidemia    Chronic, ongoing.  He refuses statin at this time and wishes to continue to focus on lifestyle changes + current diet changes.  Discussed risk with current ASCVD score and recommendations for statin.  Will recheck lipid panel today.      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  Continue to follow and restart medication as needed. Denies SI/HI.  Follow-up in 6 months.      Vitamin D deficiency    Chronic, ongoing.  Check Vit D level today and recommend continue daily supplement.        Follow up plan: Return in about 5 months (around 06/28/2023) for Annual physical after 06/25/22.

## 2023-01-28 NOTE — Assessment & Plan Note (Signed)
Chronic, stable without medication.  Continue to follow and restart medication as needed. Denies SI/HI.  Follow-up in 6 months.

## 2023-01-28 NOTE — Assessment & Plan Note (Signed)
Diagnosed 05/08/22.  Followed by endocrinology.  Continue current medication regimen as ordered by them.  Recent notes reviewed.  Thyroid labs up to date.

## 2023-01-28 NOTE — Assessment & Plan Note (Signed)
Chronic, ongoing.  BP at goal in office today, may be able to reduce medications in future.  Recommend she monitor BP at least a few mornings a week at home and document.  DASH diet at home.  Continue current medication regimen and adjust as needed.  Labs today: CBC, CMP.

## 2023-01-28 NOTE — Assessment & Plan Note (Signed)
Diagnosed 04/14/22, followed by urology. Recent notes reviewed from urology and oncology.  Has been very diet focused.  PSA today per his request.

## 2023-01-28 NOTE — Assessment & Plan Note (Signed)
Chronic, ongoing.  Check Vit D level today and recommend continue daily supplement.

## 2023-01-29 LAB — COMPREHENSIVE METABOLIC PANEL WITH GFR
ALT: 14 [IU]/L (ref 0–44)
AST: 18 [IU]/L (ref 0–40)
Albumin: 4.5 g/dL (ref 3.9–4.9)
Alkaline Phosphatase: 102 [IU]/L (ref 44–121)
BUN/Creatinine Ratio: 9 — ABNORMAL LOW (ref 10–24)
BUN: 6 mg/dL — ABNORMAL LOW (ref 8–27)
Bilirubin Total: 1.1 mg/dL (ref 0.0–1.2)
CO2: 27 mmol/L (ref 20–29)
Calcium: 9.4 mg/dL (ref 8.6–10.2)
Chloride: 97 mmol/L (ref 96–106)
Creatinine, Ser: 0.66 mg/dL — ABNORMAL LOW (ref 0.76–1.27)
Globulin, Total: 3.2 g/dL (ref 1.5–4.5)
Glucose: 96 mg/dL (ref 70–99)
Potassium: 3.8 mmol/L (ref 3.5–5.2)
Sodium: 135 mmol/L (ref 134–144)
Total Protein: 7.7 g/dL (ref 6.0–8.5)
eGFR: 103 mL/min/{1.73_m2}

## 2023-01-29 LAB — CBC WITH DIFFERENTIAL/PLATELET
Basophils Absolute: 0 10*3/uL (ref 0.0–0.2)
Basos: 1 %
EOS (ABSOLUTE): 0.2 10*3/uL (ref 0.0–0.4)
Eos: 7 %
Hematocrit: 39.9 % (ref 37.5–51.0)
Hemoglobin: 13.8 g/dL (ref 13.0–17.7)
Immature Grans (Abs): 0 10*3/uL (ref 0.0–0.1)
Immature Granulocytes: 0 %
Lymphocytes Absolute: 0.6 10*3/uL — ABNORMAL LOW (ref 0.7–3.1)
Lymphs: 18 %
MCH: 31.8 pg (ref 26.6–33.0)
MCHC: 34.6 g/dL (ref 31.5–35.7)
MCV: 92 fL (ref 79–97)
Monocytes Absolute: 0.4 10*3/uL (ref 0.1–0.9)
Monocytes: 13 %
Neutrophils Absolute: 1.9 10*3/uL (ref 1.4–7.0)
Neutrophils: 61 %
Platelets: 180 10*3/uL (ref 150–450)
RBC: 4.34 x10E6/uL (ref 4.14–5.80)
RDW: 11.6 % (ref 11.6–15.4)
WBC: 3.2 10*3/uL — ABNORMAL LOW (ref 3.4–10.8)

## 2023-01-29 LAB — LIPID PANEL W/O CHOL/HDL RATIO
Cholesterol, Total: 163 mg/dL (ref 100–199)
HDL: 87 mg/dL
LDL Chol Calc (NIH): 67 mg/dL (ref 0–99)
Triglycerides: 38 mg/dL (ref 0–149)
VLDL Cholesterol Cal: 9 mg/dL (ref 5–40)

## 2023-01-29 LAB — VITAMIN D 25 HYDROXY (VIT D DEFICIENCY, FRACTURES): Vit D, 25-Hydroxy: 36.4 ng/mL (ref 30.0–100.0)

## 2023-01-29 LAB — PSA, TOTAL AND FREE
PSA, Free Pct: 8.8 %
PSA, Free: 0.23 ng/mL
Prostate Specific Ag, Serum: 2.6 ng/mL (ref 0.0–4.0)

## 2023-01-29 NOTE — Progress Notes (Signed)
Contacted via MyChart   Good evening Jonathan Roberson, your labs have returned: - CBC continues to show mildly low white blood cell and lymphocytes, which could be related to all recent treatments and disease processes.  We will continue to monitor closely. - Kidney function, creatinine and eGFR, remains stable, as is liver function, AST and ALT.  - Cholesterol levels look great. - Vitamin D normal - Look at that PSA!!  Coming down.:)  Any questions? This is all fantastic!! Keep being amazing!!  Thank you for allowing me to participate in your care.  I appreciate you. Kindest regards, Elimelech Houseman

## 2023-02-01 ENCOUNTER — Encounter: Payer: Self-pay | Admitting: Urology

## 2023-02-02 DIAGNOSIS — J301 Allergic rhinitis due to pollen: Secondary | ICD-10-CM | POA: Diagnosis not present

## 2023-02-05 ENCOUNTER — Other Ambulatory Visit: Payer: Self-pay | Admitting: Nurse Practitioner

## 2023-02-05 NOTE — Telephone Encounter (Signed)
Unable to refill per protocol, Rx expired. Discontinued 05/19/22.  Requested Prescriptions  Pending Prescriptions Disp Refills   amLODipine (NORVASC) 10 MG tablet 90 tablet     Sig: Take 1 tablet (10 mg total) by mouth daily.     Cardiovascular: Calcium Channel Blockers 2 Passed - 02/05/2023  3:39 PM      Passed - Last BP in normal range    BP Readings from Last 1 Encounters:  01/28/23 128/73         Passed - Last Heart Rate in normal range    Pulse Readings from Last 1 Encounters:  01/28/23 68         Passed - Valid encounter within last 6 months    Recent Outpatient Visits           1 week ago Recurrent major depressive disorder, in full remission (HCC)   [place] [facility] [place], Corrie Dandy T, NP   6 months ago Prostate cancer [facility])   [place] [facility] [place], Corrie Dandy T, NP   1 year ago Recurrent major depressive disorder, in full remission (HCC)   [place] [facility] [place], Corrie Dandy T, NP   1 year ago Moderate persistent asthma without complication   [place] [facility] [place], [name], MD   2 years ago Recurrent major depressive disorder, in full remission (HCC)   [place] [facility] [place], Dorie Rank, NP       Future Appointments             In 3 months Stoioff, Verna Czech, MD [facility] [place]   In 4 months [place], Dorie Rank, NP  [facility]

## 2023-02-05 NOTE — Telephone Encounter (Signed)
Patient presented to the office stating he is completely out of his Amlodipine. Requesting refill to be sent to [facility].

## 2023-02-08 ENCOUNTER — Encounter: Payer: Self-pay | Admitting: Nurse Practitioner

## 2023-02-08 ENCOUNTER — Other Ambulatory Visit: Payer: Self-pay | Admitting: Nurse Practitioner

## 2023-02-09 DIAGNOSIS — J301 Allergic rhinitis due to pollen: Secondary | ICD-10-CM | POA: Diagnosis not present

## 2023-02-09 MED ORDER — FLUTICASONE-SALMETEROL 100-50 MCG/ACT IN AEPB: 1.0000 | INHALATION_SPRAY | Freq: Two times a day (BID) | RESPIRATORY_TRACT | 3 refills | Status: DC

## 2023-02-09 MED ORDER — LISINOPRIL 20 MG PO TABS: 20.0000 mg | ORAL_TABLET | Freq: Every day | ORAL | 4 refills | Status: DC

## 2023-02-09 MED ORDER — AMLODIPINE BESYLATE 10 MG PO TABS: 10.0000 mg | ORAL_TABLET | Freq: Every day | ORAL | 4 refills | Status: DC

## 2023-02-09 MED ORDER — FLUTICASONE PROPIONATE 50 MCG/ACT NA SUSP: 2.0000 | Freq: Every day | NASAL | 4 refills | Status: DC

## 2023-02-09 NOTE — Telephone Encounter (Signed)
Requested Prescriptions  Refused Prescriptions Disp Refills   amLODipine (NORVASC) 5 MG tablet [Pharmacy Med Name: AMLODIPINE BESYLATE 5 MG TAB] 180 tablet 4    Sig: TAKE 1 TABLET BY MOUTH ONCE EVERY MORNING AND AT BEDTIME     Cardiovascular: Calcium Channel Blockers 2 Passed - 02/08/2023 11:30 AM      Passed - Last BP in normal range    BP Readings from Last 1 Encounters:  01/28/23 128/73         Passed - Last Heart Rate in normal range    Pulse Readings from Last 1 Encounters:  01/28/23 68         Passed - Valid encounter within last 6 months    Recent Outpatient Visits           1 week ago Recurrent major depressive disorder, in full remission (HCC)   [place] [facility] [place], Corrie Dandy T, NP   6 months ago Prostate cancer [facility])   [place] [facility] [place], Corrie Dandy T, NP   1 year ago Recurrent major depressive disorder, in full remission (HCC)   [place] [facility] [place], Corrie Dandy T, NP   1 year ago Moderate persistent asthma without complication   [place] [facility] V[place], [name], MD   2 years ago Recurrent major depressive disorder, in full remission (HCC)   [place] [facility] [place], Dorie Rank, NP       Future Appointments             In 3 months Stoioff, Verna Czech, MD [facility] [place]   In 4 months [place], Dorie Rank, NP [facility]

## 2023-02-11 DIAGNOSIS — J301 Allergic rhinitis due to pollen: Secondary | ICD-10-CM | POA: Diagnosis not present

## 2023-02-16 DIAGNOSIS — J301 Allergic rhinitis due to pollen: Secondary | ICD-10-CM | POA: Diagnosis not present

## 2023-02-18 ENCOUNTER — Inpatient Hospital Stay: Payer: Medicare HMO | Attending: Radiation Oncology

## 2023-02-18 DIAGNOSIS — C61 Malignant neoplasm of prostate: Secondary | ICD-10-CM | POA: Diagnosis not present

## 2023-02-18 LAB — PSA: Prostatic Specific Antigen: 2.06 ng/mL (ref 0.00–4.00)

## 2023-02-23 DIAGNOSIS — J301 Allergic rhinitis due to pollen: Secondary | ICD-10-CM | POA: Diagnosis not present

## 2023-02-25 ENCOUNTER — Ambulatory Visit
Admission: RE | Admit: 2023-02-25 | Discharge: 2023-02-25 | Disposition: A | Discharge status: Home / Self Care | Payer: Medicare HMO | Source: Ambulatory Visit | Attending: Radiation Oncology | Admitting: Radiation Oncology

## 2023-02-25 ENCOUNTER — Other Ambulatory Visit: Payer: Self-pay | Admitting: *Deleted

## 2023-02-25 ENCOUNTER — Encounter: Payer: Self-pay | Admitting: Radiation Oncology

## 2023-02-25 VITALS — BP 129/92 | HR 67 | Temp 98.9°F | Resp 14 | Ht 73.0 in | Wt 163.1 lb

## 2023-02-25 DIAGNOSIS — C61 Malignant neoplasm of prostate: Secondary | ICD-10-CM | POA: Insufficient documentation

## 2023-02-25 DIAGNOSIS — Z923 Personal history of irradiation: Secondary | ICD-10-CM | POA: Diagnosis not present

## 2023-02-25 DIAGNOSIS — Z191 Hormone sensitive malignancy status: Secondary | ICD-10-CM | POA: Diagnosis not present

## 2023-02-25 NOTE — Progress Notes (Signed)
Radiation Oncology Follow up Note  Name: Jonathan Roberson.   Date:   02/25/2023 MRN:  [id number] DOB: [birth year]    This 67 y.o. male presents to the clinic today for 39-month follow-up status post I-125 interstitial implant for Gleason 7 (3+4) adenocarcinoma presenting with a PSA in the 6 range.  REFERRING PROVIDER: Marjie Skiff, NP  HPI: Patient is a 67 year old male now out for months having completed I-125 interstitial implant for Gleason 7 adenocarcinoma.  Seen today in routine follow-up most of his side effects have abated.  He was having some diarrhea which is cleared still has nocturia x 4.  He has been off her Flomax although declined that.  His most recent PSA.  Is down to 2.0 and was 2.74-month ago.  COMPLICATIONS OF TREATMENT: none  FOLLOW UP COMPLIANCE: keeps appointments   PHYSICAL EXAM:  BP (!) 129/92   Pulse 67   Temp 98.9 F (37.2 C)   Resp 14   Ht 6\' 1"  (1.854 m)   Wt 163 lb 1.6 oz (74 kg)   BMI 21.52 kg/m  Well-developed well-nourished patient in NAD. HEENT reveals PERLA, EOMI, discs not visualized.  Oral cavity is clear. No oral mucosal lesions are identified. Neck is clear without evidence of cervical or supraclavicular adenopathy. Lungs are clear to A&P. Cardiac examination is essentially unremarkable with regular rate and rhythm without murmur rub or thrill. Abdomen is benign with no organomegaly or masses noted. Motor sensory and DTR levels are equal and symmetric in the upper and lower extremities. Cranial nerves II through XII are grossly intact. Proprioception is intact. No peripheral adenopathy or edema is identified. No motor or sensory levels are noted. Crude visual fields are within normal range.  RADIOLOGY RESULTS: No current films for review  PLAN: Present time patient is doing well with PSA tending in the right direction under excellent biochemical control.  On pleased with his overall progress.  He is maintaining a plant-based food diet which he  seems by some current literature to support better results and prostate cancer.  I have asked to see him back in 6 months for follow-up with a PSA at that time.  Of also asked him to make follow-up appointments with Dr. Lonna Cobb.  I would like to take this opportunity to thank you for allowing me to participate in the care of your patient.Carmina Miller, MD

## 2023-03-02 DIAGNOSIS — J301 Allergic rhinitis due to pollen: Secondary | ICD-10-CM | POA: Diagnosis not present

## 2023-03-09 DIAGNOSIS — E0590 Thyrotoxicosis, unspecified without thyrotoxic crisis or storm: Secondary | ICD-10-CM | POA: Diagnosis not present

## 2023-03-09 DIAGNOSIS — E782 Mixed hyperlipidemia: Secondary | ICD-10-CM | POA: Diagnosis not present

## 2023-03-09 DIAGNOSIS — J301 Allergic rhinitis due to pollen: Secondary | ICD-10-CM | POA: Diagnosis not present

## 2023-03-10 LAB — CBC WITH DIFFERENTIAL/PLATELET
Basophils Absolute: 0 10*3/uL (ref 0.0–0.2)
Basos: 1 %
EOS (ABSOLUTE): 0.2 10*3/uL (ref 0.0–0.4)
Eos: 5 %
Hematocrit: 38.5 % (ref 37.5–51.0)
Hemoglobin: 13 g/dL (ref 13.0–17.7)
Immature Grans (Abs): 0 10*3/uL (ref 0.0–0.1)
Immature Granulocytes: 0 %
Lymphocytes Absolute: 0.8 10*3/uL (ref 0.7–3.1)
Lymphs: 22 %
MCH: 31.9 pg (ref 26.6–33.0)
MCHC: 33.8 g/dL (ref 31.5–35.7)
MCV: 94 fL (ref 79–97)
Monocytes Absolute: 0.5 10*3/uL (ref 0.1–0.9)
Monocytes: 14 %
Neutrophils Absolute: 2.1 10*3/uL (ref 1.4–7.0)
Neutrophils: 58 %
Platelets: 168 10*3/uL (ref 150–450)
RBC: 4.08 x10E6/uL — ABNORMAL LOW (ref 4.14–5.80)
RDW: 11.8 % (ref 11.6–15.4)
WBC: 3.5 10*3/uL (ref 3.4–10.8)

## 2023-03-10 LAB — T3, FREE: T3, Free: 2.7 pg/mL (ref 2.0–4.4)

## 2023-03-10 LAB — LIPID PANEL
Chol/HDL Ratio: 2.1 {ratio} (ref 0.0–5.0)
Cholesterol, Total: 146 mg/dL (ref 100–199)
HDL: 71 mg/dL (ref >39)
LDL Chol Calc (NIH): 58 mg/dL (ref 0–99)
Triglycerides: 92 mg/dL (ref 0–149)
VLDL Cholesterol Cal: 17 mg/dL (ref 5–40)

## 2023-03-10 LAB — TSH: TSH: 1.39 u[IU]/mL (ref 0.450–4.500)

## 2023-03-10 LAB — T4, FREE: Free T4: 1.38 ng/dL (ref 0.82–1.77)

## 2023-03-16 DIAGNOSIS — J301 Allergic rhinitis due to pollen: Secondary | ICD-10-CM | POA: Diagnosis not present

## 2023-03-17 DIAGNOSIS — H35372 Puckering of macula, left eye: Secondary | ICD-10-CM | POA: Diagnosis not present

## 2023-03-17 DIAGNOSIS — H5203 Hypermetropia, bilateral: Secondary | ICD-10-CM | POA: Diagnosis not present

## 2023-03-17 DIAGNOSIS — H43813 Vitreous degeneration, bilateral: Secondary | ICD-10-CM | POA: Diagnosis not present

## 2023-03-18 ENCOUNTER — Encounter: Payer: Self-pay | Admitting: "Endocrinology

## 2023-03-18 ENCOUNTER — Ambulatory Visit: Payer: Medicare HMO | Admitting: "Endocrinology

## 2023-03-18 VITALS — BP 144/84 | HR 64 | Ht 74.0 in | Wt 162.4 lb

## 2023-03-18 DIAGNOSIS — E782 Mixed hyperlipidemia: Secondary | ICD-10-CM

## 2023-03-18 DIAGNOSIS — I10 Essential (primary) hypertension: Secondary | ICD-10-CM

## 2023-03-18 DIAGNOSIS — E0590 Thyrotoxicosis, unspecified without thyrotoxic crisis or storm: Secondary | ICD-10-CM

## 2023-03-18 NOTE — Progress Notes (Signed)
03/18/2023, 4:53 PM  Endocrinology follow-up note               Subjective:    Patient ID: Jonathan Roberson., male    DOB: February 15, 1956, PCP Marjie Skiff, NP   Past Medical History:  Diagnosis Date   Allergy    Allergy to dust    Arthritis    hands, hips, knees   Asthma    Cancer (HCC)    Depression    Hyperlipidemia    Hypertension    Hyperthyroidism    Past Surgical History:  Procedure Laterality Date   BIOPSY N/A 08/14/2016   Procedure: BIOPSY;  Surgeon: Midge Minium, MD;  Location: [facility];  Service: Endoscopy;  Laterality: N/A;   COLONOSCOPY WITH PROPOFOL N/A 08/14/2016   Procedure: COLONOSCOPY WITH PROPOFOL;  Surgeon: Midge Minium, MD;  Location: [facility];  Service: Endoscopy;  Laterality: N/A;   IMAGE GUIDED SINUS SURGERY Bilateral 09/24/2015   Procedure: IMAGE GUIDED SINUS SURGERY Bilateral nasal polypectomy,maxillary antrostomy with tissue removal,bilateral ethmoidectomy,frontal sinusotomy;  Surgeon: Geanie Logan, MD;  Location: [facility];  Service: ENT;  Laterality: Bilateral;  GAVE DISK TO CE CE 4-12 KP   NASAL SINUS SURGERY  2005   Dr. Gertie Baron, [facility]   RADIOACTIVE SEED IMPLANT N/A 10/20/2022   Procedure: RADIOACTIVE SEED IMPLANT/BRACHYTHERAPY IMPLANT;  Surgeon: Riki Altes, MD;  Location: [facility];  Service: Urology;  Laterality: N/A;  67 seeds implanted   Social History   Socioeconomic History   Marital status: Married    Spouse name: Not on file   Number of children: Not on file   Years of education: Not on file   Highest education level: Not on file  Occupational History   Not on file  Tobacco Use   Smoking status: Never   Smokeless tobacco: Never  Vaping Use   Vaping status: Never Used  Substance and Sexual Activity   Alcohol use: Not Currently    Alcohol/week: 0.0 standard drinks of alcohol    Comment: on occasion- about once a year   Drug use: No    Sexual activity: Yes  Other Topics Concern   Not on file  Social History Narrative   Not on file   Social Determinants of Health   Financial Resource Strain: Low Risk  (10/13/2022)   Overall Financial Resource Strain (CARDIA)    Difficulty of Paying Living Expenses: Not hard at all  Food Insecurity: No Food Insecurity (10/13/2022)   Hunger Vital Sign    Worried About Running Out of Food in the Last Year: Never true    Ran Out of Food in the Last Year: Never true  Transportation Needs: No Transportation Needs (10/13/2022)   PRAPARE - Administrator, Civil Service (Medical): No    Lack of Transportation (Non-Medical): No  Physical Activity: Sufficiently Active (10/13/2022)   Exercise Vital Sign    Days of Exercise per Week: 7 days    Minutes of Exercise per Session: 60 min  Stress: Stress Concern Present (10/13/2022)   [employer] of Occupational Health - Occupational Stress Questionnaire    Feeling of Stress : To some extent  Social Connections: Moderately Integrated (  10/13/2022)   Social Connection and Isolation Panel [NHANES]    Frequency of Communication with Friends and Family: More than three times a week    Frequency of Social Gatherings with Friends and Family: Twice a week    Attends Religious Services: More than 4 times per year    Active Member of [employer] or Organizations: No    Attends Banker Meetings: Never    Marital Status: Married   Family History  Problem Relation Age of Onset   Heart disease Mother    Depression Mother    Arthritis Sister    Emphysema Father    Depression Daughter    Outpatient Encounter Medications as of 03/18/2023  Medication Sig   albuterol (PROVENTIL) (2.5 MG/3ML) 0.083% nebulizer solution USE 1 VIAL (3 ML) (2.5 MG TOTAL) VIA NEBULIZER EVERY 6 HOURS AS NEEDED FOR WHEEZING OR SHORTNESS OF BREATH   albuterol (VENTOLIN HFA) 108 (90 Base) MCG/ACT inhaler Inhale 2 puffs into the lungs every 6 (six) hours as needed  for wheezing or shortness of breath.   amLODipine (NORVASC) 10 MG tablet Take 1 tablet (10 mg total) by mouth daily.   Ascorbic Acid (VITAMIN C) 1000 MG tablet Take 1,000 mg by mouth daily.   Aspirin Buf,CaCarb-MgCarb-MgO, 81 MG TABS Take by mouth.   Cyanocobalamin (VITAMIN B 12) 500 MCG TABS Take 2 tablets by mouth daily with lunch.   fluticasone (FLONASE) 50 MCG/ACT nasal spray Place 2 sprays into both nostrils daily.   fluticasone-salmeterol (ADVAIR) 100-50 MCG/ACT AEPB Inhale 1 puff into the lungs 2 (two) times daily.   Glucosamine-Chondroitin 1500-1200 MG/30ML LIQD Take by mouth.   lisinopril (ZESTRIL) 20 MG tablet Take 1 tablet (20 mg total) by mouth daily. (Patient taking differently: Take 40 mg by mouth daily.)   loratadine (CLARITIN) 10 MG tablet Take 10 mg by mouth daily as needed for allergies.   methimazole (TAPAZOLE) 5 MG tablet Take 0.5 tablets (2.5 mg total) by mouth daily.   Misc Natural Products (GLUCOSAMINE CHOND CMP DOUBLE PO) Take 1,500 mg by mouth daily. Chondrotin 1200 mg   NON FORMULARY Allergy injections weekly   relugolix (ORGOVYX) 120 MG tablet 3 tablets on first day and then 1 tablet daily.   Saw Palmetto 1000 MG CAPS Take by mouth.   tamsulosin (FLOMAX) 0.4 MG CAPS capsule Take 1 capsule (0.4 mg total) by mouth daily after supper.   Turmeric (QC TUMERIC COMPLEX PO) Take 900 mg by mouth daily.   VITAMIN D, ERGOCALCIFEROL, PO Take 1 tablet by mouth daily.   Zinc 100 MG TABS Take by mouth.   No facility-administered encounter medications on file as of 03/18/2023.   ALLERGIES: Allergies  Allergen Reactions   Losartan Shortness Of Breath   Monosodium Glutamate     headaches   Amoxicillin Rash   Erythromycin Rash   Penicillins Rash    VACCINATION STATUS: Immunization History  Administered Date(s) Administered   Influenza,inj,Quad PF,6+ Mos 05/11/2018   Influenza-Unspecified 03/02/2016, 03/22/2017, 03/16/2019   PFIZER(Purple Top)SARS-COV-2 Vaccination  08/11/2019, 09/01/2019   PNEUMOCOCCAL CONJUGATE-20 01/28/2022   Pneumococcal Polysaccharide-23 04/18/2014   Tdap 04/07/2013    HPI Jonathan Roberson. is 67 y.o. male who presents today with a medical history as above. he is being seen in follow-up for hyperthyroidism.  He remains on the low-dose methimazole, 2.5 mg p.o. daily to which she is responding and tolerating very well.  His previsit labs are consistent with controlled thyroid hormone profile.  His recent thyroid uptake  and scan was unremarkable, did not require ablative treatment.  However, his thyroid function tests are indicative of mild Graves' disease with positive TPO antibodies.  He did not require ablative/antithyroid treatment. He also has medical history of hyperlipidemia on whole food plant-based diet with great results-see below.  He is not on statins for now, presents with significant improvement in his lipid panel from whole food plant-based diet.   He has no new complaints. He denies palpitations, heat intolerance.  Denies dysphagia, shortness of breath, nor voice change. He underwent thyroid ultrasound on March 02, 2022 which showed normal thyroid ultrasound. Specifically, no evidence of thyromegaly,  atrophy or discrete thyroid nodule or mass.    He has family history of hypothyroidism in his sister.  No family history of thyroid malignancy. His other medical problems include hypertension-uncontrolled despite 2 medications at appropriate doses. He is status post brachytherapy with radioactive seed implant to treat prostate cancer.  He is also on Orgovyx 120 mg p.o. daily.  He is a non-smoker, nonalcohol user.  Review of Systems    Objective:       03/18/2023   11:04 AM 03/18/2023   10:30 AM 02/25/2023    9:45 AM  Vitals with BMI  Height  6\' 2"  6\' 1"   Weight  162 lbs 6 oz 163 lbs 2 oz  BMI  20.84 21.52  Systolic 144 142 045  Diastolic 84 88 92  Pulse  64 67    BP (!) 144/84 Comment: R arm with manual  cuff. Dr.Seba Madole made aware. Pt advised to continue to check BP twice daily and contact PCP.  Pulse 64   Ht 6\' 2"  (1.88 m)   Wt 162 lb 6.4 oz (73.7 kg)   BMI 20.85 kg/m   Wt Readings from Last 3 Encounters:  03/18/23 162 lb 6.4 oz (73.7 kg)  02/25/23 163 lb 1.6 oz (74 kg)  01/28/23 165 lb 6.4 oz (75 kg)    Physical Exam    CMP ( most recent) CMP     Component Value Date/Time   NA 135 01/28/2023 1101   K 3.8 01/28/2023 1101   CL 97 01/28/2023 1101   CO2 27 01/28/2023 1101   GLUCOSE 96 01/28/2023 1101   BUN 6 (L) 01/28/2023 1101   CREATININE 0.66 (L) 01/28/2023 1101   CALCIUM 9.4 01/28/2023 1101   PROT 7.7 01/28/2023 1101   ALBUMIN 4.5 01/28/2023 1101   AST 18 01/28/2023 1101   ALT 14 01/28/2023 1101   ALKPHOS 102 01/28/2023 1101   BILITOT 1.1 01/28/2023 1101   GFRNONAA 93 06/24/2020 1555   GFRAA 108 06/24/2020 1555       Lipid Panel ( most recent) Lipid Panel     Component Value Date/Time   CHOL 146 03/09/2023 1513   TRIG 92 03/09/2023 1513   HDL 71 03/09/2023 1513   CHOLHDL 2.1 03/09/2023 1513   LDLCALC 58 03/09/2023 1513   LABVLDL 17 03/09/2023 1513      Lab Results  Component Value Date   TSH 1.390 03/09/2023   TSH 1.430 10/27/2022   TSH 1.420 08/21/2022   TSH 0.170 (L) 07/30/2022   TSH <0.005 (L) 05/11/2022   TSH 0.179 (L) 01/28/2022   TSH 0.173 (L) 12/23/2020   TSH 0.021 (L) 06/24/2020   TSH 0.675 06/19/2019   TSH 0.947 06/29/2017   FREET4 1.38 03/09/2023   FREET4 1.26 10/27/2022   FREET4 1.17 08/21/2022   FREET4 1.17 07/30/2022   FREET4 1.78 (H) 05/11/2022  FREET4 1.40 12/23/2020    Reviewed ultrasound on March 02, 2022: Total right and left lobes measure 5.1 cm in greatest dimension. Normal thyroid ultrasound. Specifically, no evidence of thyromegaly, atrophy or discrete thyroid nodule or mass.  Thyroid uptake and scan on May 05, 2022  FINDINGS: Homogeneous tracer distribution in both thyroid lobes.   No focal areas of  increased or decreased tracer localization.   4 hour I-123 uptake = 8.7% (normal 5-20%),  24 hour I-123 uptake = 21.9% (normal 10-30%)   IMPRESSION: Normal exam.   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 Hyperlipidemia 3. Hypertension  Brecker did have mild hyperthyroidism which did not require ablative treatment with radioactive iodine.  He is benefiting from low-dose methimazole, advised to continue methimazole 2.5 mg p.o. daily at breakfast.   He will have a repeat thyroid function test before his next visit in 6 months. Previous Thyroid ultrasound is unremarkable, will not need antithyroid intervention for now.  -His previsit labs show significantly improved lipid panel on whole food plant-based diet with LDL at 58, improving from 098.  This is without any statin intervention.  In light of the fact that he wishes to avoid statins, he is encouraged to continue on whole food plant-based diet with more fiber intake.     He is encouraged to stay close to his urologist for the care of his prostate cancer.  For better control of BP, he is encouraged to stay on lisinopril 40 mg p.o. daily along with amlodipine 10 mg p.o. daily.  He is advised to start measuring his blood pressure 1-2 times daily and average in a week.  If his average blood glucose is 120-129/80+, he may need additional medication in the form of a diuretic.  This can be achieved with lisinopril/HCTZ to lower pill burden.  He would like to address this with his PCP.   - he is advised to maintain close follow up with Marjie Skiff, NP for primary care needs.   I spent  26  minutes in the care of the patient today including review of labs from Thyroid Function, CMP, and other relevant labs ; imaging/biopsy records (current and previous including abstractions from other facilities); face-to-face time discussing  his lab results and symptoms, medications doses, his options of short and long term  treatment based on the latest standards of care / guidelines;   and documenting the encounter.  Jonathan Roberson.  participated in the discussions, expressed understanding, and voiced agreement with the above plans.  All questions were answered to his satisfaction. he is encouraged to contact clinic should he have any questions or concerns prior to his return visit.   Follow up plan: Return in about 6 months (around 09/16/2023) for Fasting Labs  in AM B4 8.   Marquis Lunch, MD [facility] [address] [place], [place] [address] Phone: [phone]  Fax: [phone]     03/18/2023, 4:53 PM  This note was partially dictated with voice recognition software. Similar sounding words can be transcribed inadequately or may not  be corrected upon review.

## 2023-03-20 ENCOUNTER — Encounter: Payer: Self-pay | Admitting: Nurse Practitioner

## 2023-03-23 DIAGNOSIS — J301 Allergic rhinitis due to pollen: Secondary | ICD-10-CM | POA: Diagnosis not present

## 2023-03-28 NOTE — Patient Instructions (Signed)
Be Involved in Caring For Your Health:  Taking Medications When medications are taken as directed, they can greatly improve your health. But if they are not taken as prescribed, they may not work. In some cases, not taking them correctly can be harmful. To help ensure your treatment remains effective and safe, understand your medications and how to take them. Bring your medications to each visit for review by your provider.  Your lab results, notes, and after visit summary will be available on My Chart. We strongly encourage you to use this feature. If lab results are abnormal the clinic will contact you with the appropriate steps. If the clinic does not contact you assume the results are satisfactory. You can always view your results on My Chart. If you have questions regarding your health or results, please contact the clinic during office hours. You can also ask questions on My Chart.  We at [facility] are grateful that you chose us to provide your care. We strive to provide evidence-based and compassionate care and are always looking for feedback. If you get a survey from the clinic please complete this so we can hear your opinions.  DASH Eating Plan DASH stands for Dietary Approaches to Stop Hypertension. The DASH eating plan is a healthy eating plan that has been shown to: Lower high blood pressure (hypertension). Reduce your risk for type 2 diabetes, heart disease, and stroke. Help with weight loss. What are tips for following this plan? Reading food labels Check food labels for the amount of salt (sodium) per serving. Choose foods with less than 5 percent of the Daily Value (DV) of sodium. In general, foods with less than 300 milligrams (mg) of sodium per serving fit into this eating plan. To find whole grains, look for the word "whole" as the first word in the ingredient list. Shopping Buy products labeled as "low-sodium" or "no salt added." Buy fresh foods. Avoid canned  foods and pre-made or frozen meals. Cooking Try not to add salt when you cook. Use salt-free seasonings or herbs instead of table salt or sea salt. Check with your health care provider or pharmacist before using salt substitutes. Do not fry foods. Cook foods in healthy ways, such as baking, boiling, grilling, roasting, or broiling. Cook using oils that are good for your heart. These include olive, canola, avocado, soybean, and sunflower oil. Meal planning  Eat a balanced diet. This should include: 4 or more servings of fruits and 4 or more servings of vegetables each day. Try to fill half of your plate with fruits and vegetables. 6-8 servings of whole grains each day. 6 or less servings of lean meat, poultry, or fish each day. 1 oz is 1 serving. A 3 oz (85 g) serving of meat is about the same size as the palm of your hand. One egg is 1 oz (28 g). 2-3 servings of low-fat dairy each day. One serving is 1 cup (237 mL). 1 serving of nuts, seeds, or beans 5 times each week. 2-3 servings of heart-healthy fats. Healthy fats called omega-3 fatty acids are found in foods such as walnuts, flaxseeds, fortified milks, and eggs. These fats are also found in cold-water fish, such as sardines, salmon, and mackerel. Limit how much you eat of: Canned or prepackaged foods. Food that is high in trans fat, such as fried foods. Food that is high in saturated fat, such as fatty meat. Desserts and other sweets, sugary drinks, and other foods with added sugar. Full-fat   dairy products. Do not salt foods before eating. Do not eat more than 4 egg yolks a week. Try to eat at least 2 vegetarian meals a week. Eat more home-cooked food and less restaurant, buffet, and fast food. Lifestyle When eating at a restaurant, ask if your food can be made with less salt or no salt. If you drink alcohol: Limit how much you have to: 0-1 drink a day if you are male. 0-2 drinks a day if you are male. Know how much alcohol is in  your drink. In the [place] one drink is one 12 oz bottle of beer (355 mL), one 5 oz glass of wine (148 mL), or one 1 oz glass of hard liquor (44 mL). General information Avoid eating more than 2,300 mg of salt a day. If you have hypertension, you may need to reduce your sodium intake to 1,500 mg a day. Work with your provider to stay at a healthy body weight or lose weight. Ask what the best weight range is for you. On most days of the week, get at least 30 minutes of exercise that causes your heart to beat faster. This may include walking, swimming, or biking. Work with your provider or dietitian to adjust your eating plan to meet your specific calorie needs. What foods should I eat? Fruits All fresh, dried, or frozen fruit. Canned fruits that are in their natural juice and do not have sugar added to them. Vegetables Fresh or frozen vegetables that are raw, steamed, roasted, or grilled. Low-sodium or reduced-sodium tomato and vegetable juice. Low-sodium or reduced-sodium tomato sauce and tomato paste. Low-sodium or reduced-sodium canned vegetables. Grains Whole-grain or whole-wheat bread. Whole-grain or whole-wheat pasta. Brown rice. Oatmeal. Quinoa. Bulgur. Whole-grain and low-sodium cereals. Pita bread. Low-fat, low-sodium crackers. Whole-wheat flour tortillas. Meats and other proteins Skinless chicken or turkey. Ground chicken or turkey. Pork with fat trimmed off. Fish and seafood. Egg whites. Dried beans, peas, or lentils. Unsalted nuts, nut butters, and seeds. Unsalted canned beans. Lean cuts of beef with fat trimmed off. Low-sodium, lean precooked or cured meat, such as sausages or meat loaves. Dairy Low-fat (1%) or fat-free (skim) milk. Reduced-fat, low-fat, or fat-free cheeses. Nonfat, low-sodium ricotta or cottage cheese. Low-fat or nonfat yogurt. Low-fat, low-sodium cheese. Fats and oils Soft margarine without trans fats. Vegetable oil. Reduced-fat, low-fat, or light mayonnaise and salad  dressings (reduced-sodium). Canola, safflower, olive, avocado, soybean, and sunflower oils. Avocado. Seasonings and condiments Herbs. Spices. Seasoning mixes without salt. Other foods Unsalted popcorn and pretzels. Fat-free sweets. The items listed above may not be all the foods and drinks you can have. Talk to a dietitian to learn more. What foods should I avoid? Fruits Canned fruit in a light or heavy syrup. Fried fruit. Fruit in cream or butter sauce. Vegetables Creamed or fried vegetables. Vegetables in a cheese sauce. Regular canned vegetables that are not marked as low-sodium or reduced-sodium. Regular canned tomato sauce and paste that are not marked as low-sodium or reduced-sodium. Regular tomato and vegetable juices that are not marked as low-sodium or reduced-sodium. Pickles. Olives. Grains Baked goods made with fat, such as croissants, muffins, or some breads. Dry pasta or rice meal packs. Meats and other proteins Fatty cuts of meat. Ribs. Fried meat. Bacon. Bologna, salami, and other precooked or cured meats, such as sausages or meat loaves, that are not lean and low in sodium. Fat from the back of a pig (fatback). Bratwurst. Salted nuts and seeds. Canned beans with added salt. Canned   or smoked fish. Whole eggs or egg yolks. Chicken or turkey with skin. Dairy Whole or 2% milk, cream, and half-and-half. Whole or full-fat cream cheese. Whole-fat or sweetened yogurt. Full-fat cheese. Nondairy creamers. Whipped toppings. Processed cheese and cheese spreads. Fats and oils Butter. Stick margarine. Lard. Shortening. Ghee. Bacon fat. Tropical oils, such as coconut, palm kernel, or palm oil. Seasonings and condiments Onion salt, garlic salt, seasoned salt, table salt, and sea salt. Worcestershire sauce. Tartar sauce. Barbecue sauce. Teriyaki sauce. Soy sauce, including reduced-sodium soy sauce. Steak sauce. Canned and packaged gravies. Fish sauce. Oyster sauce. Cocktail sauce. Store-bought  horseradish. Ketchup. Mustard. Meat flavorings and tenderizers. Bouillon cubes. Hot sauces. Pre-made or packaged marinades. Pre-made or packaged taco seasonings. Relishes. Regular salad dressings. Other foods Salted popcorn and pretzels. The items listed above may not be all the foods and drinks you should avoid. Talk to a dietitian to learn more. Where to find more information National Heart, Lung, and Blood Institute (NHLBI): nhlbi.nih.gov American Heart Association (AHA): heart.org Academy of Nutrition and Dietetics: eatright.org National Kidney Foundation (NKF): kidney.org This information is not intended to replace advice given to you by your health care provider. Make sure you discuss any questions you have with your health care provider. Document Revised: 06/04/2022 Document Reviewed: 06/04/2022 Elsevier Patient Education  2024 Elsevier Inc.  

## 2023-03-30 ENCOUNTER — Ambulatory Visit (INDEPENDENT_AMBULATORY_CARE_PROVIDER_SITE_OTHER): Payer: Medicare HMO | Admitting: Nurse Practitioner

## 2023-03-30 ENCOUNTER — Encounter: Payer: Self-pay | Admitting: Nurse Practitioner

## 2023-03-30 VITALS — BP 115/76 | HR 71 | Temp 97.8°F | Ht 74.0 in | Wt 166.6 lb

## 2023-03-30 DIAGNOSIS — I10 Essential (primary) hypertension: Secondary | ICD-10-CM

## 2023-03-30 DIAGNOSIS — J301 Allergic rhinitis due to pollen: Secondary | ICD-10-CM | POA: Diagnosis not present

## 2023-03-30 NOTE — Assessment & Plan Note (Addendum)
Chronic, ongoing.  BP at goal in office today.  Recommend she monitor BP at least a few mornings a week at home and document.  DASH diet at home.  Continue current medication regimen and adjust as needed.  Labs today: up to date.  He will check BP at home daily for one week - 2 hours after he takes BP medication.  If elevations >130/80 then add hydrochlorothiazide 12.5 MG.

## 2023-03-30 NOTE — Progress Notes (Addendum)
BP 115/76   Pulse 71   Temp 97.8 F (36.6 C) (Oral)   Ht 6\' 2"  (1.88 m)   Wt 166 lb 9.6 oz (75.6 kg)   SpO2 97%   BMI 21.39 kg/m    Subjective:    Patient ID: Jonathan Maker., male    DOB: 07-20-1955, 67 y.o.   MRN: [id number]  HPI: Jonathan Roberson. is a 67 y.o. male  Chief Complaint  Patient presents with   Hypertension   HYPERTENSION without Chronic Kidney Disease Recently had elevation in BP at endocrinology, 144/84. He does check BP at home, but checks before medication -- numbers at home alternate. Continues to ride 10 miles on bike every night and eating healthy. Hypertension status: stable  Satisfied with current treatment? yes Duration of hypertension: chronic BP monitoring frequency:   daily BP range: varies BP medication side effects:  no Medication compliance: good compliance Aspirin: no Recurrent headaches: no Visual changes: no Palpitations: no Dyspnea: no Chest pain: no Lower extremity edema: no Dizzy/lightheaded: no  Relevant past medical, surgical, family and social history reviewed and updated as indicated. Interim medical history since our last visit reviewed. Allergies and medications reviewed and updated.  Review of Systems  Constitutional:  Negative for activity change, diaphoresis, fatigue and fever.  Respiratory:  Negative for cough, chest tightness, shortness of breath and wheezing.   Cardiovascular:  Negative for chest pain, palpitations and leg swelling.  Gastrointestinal: Negative.   Endocrine: Negative for cold intolerance and heat intolerance.  Neurological: Negative.   Psychiatric/Behavioral: Negative.     Per HPI unless specifically indicated above     Objective:    BP 115/76   Pulse 71   Temp 97.8 F (36.6 C) (Oral)   Ht 6\' 2"  (1.88 m)   Wt 166 lb 9.6 oz (75.6 kg)   SpO2 97%   BMI 21.39 kg/m   Wt Readings from Last 3 Encounters:  03/30/23 166 lb 9.6 oz (75.6 kg)  03/18/23 162 lb 6.4 oz (73.7 kg)  02/25/23 163  lb 1.6 oz (74 kg)    Physical Exam Vitals and nursing note reviewed.  Constitutional:      General: He is awake. He is not in acute distress.    Appearance: He is well-developed and well-groomed. He is not ill-appearing or toxic-appearing.  HENT:     Head: Normocephalic.     Right Ear: Hearing and external ear normal.     Left Ear: Hearing and external ear normal.  Eyes:     General: Lids are normal.     Extraocular Movements: Extraocular movements intact.     Conjunctiva/sclera: Conjunctivae normal.  Neck:     Thyroid: No thyromegaly.     Vascular: No carotid bruit.  Cardiovascular:     Rate and Rhythm: Normal rate and regular rhythm.     Heart sounds: Normal heart sounds. No murmur heard.    No gallop.  Pulmonary:     Effort: No accessory muscle usage or respiratory distress.     Breath sounds: Normal breath sounds.  Abdominal:     General: Bowel sounds are normal. There is no distension.     Palpations: Abdomen is soft.     Tenderness: There is no abdominal tenderness.  Musculoskeletal:     Cervical back: Full passive range of motion without pain.     Right lower leg: No edema.     Left lower leg: No edema.  Lymphadenopathy:  Cervical: No cervical adenopathy.  Skin:    General: Skin is warm.     Capillary Refill: Capillary refill takes less than 2 seconds.  Neurological:     Mental Status: He is alert and oriented to person, place, and time.     Deep Tendon Reflexes: Reflexes are normal and symmetric.     Reflex Scores:      Brachioradialis reflexes are 2+ on the right side and 2+ on the left side.      Patellar reflexes are 2+ on the right side and 2+ on the left side. Psychiatric:        Attention and Perception: Attention normal.        Mood and Affect: Mood normal.        Speech: Speech normal.        Behavior: Behavior normal. Behavior is cooperative.        Thought Content: Thought content normal.    Results for orders placed or performed in visit on  02/18/23  PSA  Result Value Ref Range   Prostatic Specific Antigen 2.06 0.00 - 4.00 ng/mL      Assessment & Plan:   Problem List Items Addressed This Visit       Cardiovascular and Mediastinum   Essential hypertension, benign - Primary    Chronic, ongoing.  BP at goal in office today.  Recommend she monitor BP at least a few mornings a week at home and document.  DASH diet at home.  Continue current medication regimen and adjust as needed.  Labs today: up to date.  He will check BP at home daily for one week - 2 hours after he takes BP medication.  If elevations >130/80 then add hydrochlorothiazide 12.5 MG.         Follow up plan: Return for as scheduled.

## 2023-04-06 DIAGNOSIS — J301 Allergic rhinitis due to pollen: Secondary | ICD-10-CM | POA: Diagnosis not present

## 2023-04-13 DIAGNOSIS — J301 Allergic rhinitis due to pollen: Secondary | ICD-10-CM | POA: Diagnosis not present

## 2023-04-20 DIAGNOSIS — J301 Allergic rhinitis due to pollen: Secondary | ICD-10-CM | POA: Diagnosis not present

## 2023-04-27 DIAGNOSIS — J301 Allergic rhinitis due to pollen: Secondary | ICD-10-CM | POA: Diagnosis not present

## 2023-05-03 DIAGNOSIS — J301 Allergic rhinitis due to pollen: Secondary | ICD-10-CM | POA: Diagnosis not present

## 2023-05-04 DIAGNOSIS — J301 Allergic rhinitis due to pollen: Secondary | ICD-10-CM | POA: Diagnosis not present

## 2023-05-07 ENCOUNTER — Other Ambulatory Visit: Payer: Self-pay | Admitting: Nurse Practitioner

## 2023-05-10 NOTE — Telephone Encounter (Signed)
Requested Prescriptions  Refused Prescriptions Disp Refills   PARoxetine (PAXIL) 20 MG tablet [Pharmacy Med Name: PAROXETINE HCL 20 MG TAB] 90 tablet 0    Sig: TAKE 1 TABLET BY MOUTH ONCE DAILY     Psychiatry:  Antidepressants - SSRI Passed - 05/07/2023 10:14 AM      Passed - Completed PHQ-2 or PHQ-9 in the last 360 days      Passed - Valid encounter within last 6 months    Recent Outpatient Visits           1 month ago Essential hypertension, benign   [place] [facility] [place], [place] T, NP   3 months ago Recurrent major depressive disorder, in full remission (HCC)   [place] [facility] [place], Corrie Dandy T, NP   9 months ago Prostate cancer [facility])   [place] [facility] [place], Corrie Dandy T, NP   1 year ago Recurrent major depressive disorder, in full remission (HCC)   [place] [facility] [place], Corrie Dandy T, NP   1 year ago Moderate persistent asthma without complication   [place] [name], [name], MD       Future Appointments             In 1 week Stoioff, Verna Czech, MD [facility] [place]   In 1 month [place], Dorie Rank, NP [place] [facility] [place], [facility]

## 2023-05-11 DIAGNOSIS — J301 Allergic rhinitis due to pollen: Secondary | ICD-10-CM | POA: Diagnosis not present

## 2023-05-14 ENCOUNTER — Other Ambulatory Visit: Payer: Self-pay

## 2023-05-14 DIAGNOSIS — R972 Elevated prostate specific antigen [PSA]: Secondary | ICD-10-CM

## 2023-05-14 DIAGNOSIS — C61 Malignant neoplasm of prostate: Secondary | ICD-10-CM

## 2023-05-16 ENCOUNTER — Other Ambulatory Visit: Payer: Self-pay | Admitting: Nurse Practitioner

## 2023-05-17 ENCOUNTER — Other Ambulatory Visit: Payer: Medicare HMO

## 2023-05-17 DIAGNOSIS — C61 Malignant neoplasm of prostate: Secondary | ICD-10-CM | POA: Diagnosis not present

## 2023-05-17 DIAGNOSIS — R972 Elevated prostate specific antigen [PSA]: Secondary | ICD-10-CM | POA: Diagnosis not present

## 2023-05-17 NOTE — Telephone Encounter (Signed)
Requested Prescriptions  Pending Prescriptions Disp Refills   fluticasone-salmeterol (WIXELA INHUB) 100-50 MCG/ACT AEPB [Pharmacy Med Name: WIXELA 100-50 INHUB] 1 each 3    Sig: INHALE 1 PUFF INTO THE LUNGS TWICE DAILY     Pulmonology:  Combination Products Passed - 05/17/2023  3:53 PM      Passed - Valid encounter within last 12 months    Recent Outpatient Visits           1 month ago Essential hypertension, benign   [place] [facility] [place], [place] T, NP   3 months ago Recurrent major depressive disorder, in full remission (HCC)   [place] [facility] [place], Corrie Dandy T, NP   9 months ago Prostate cancer [facility])   [place] [facility] [place], Corrie Dandy T, NP   1 year ago Recurrent major depressive disorder, in full remission (HCC)   [place] [facility] [place], Corrie Dandy T, NP   1 year ago Moderate persistent asthma without complication   [place] [name], [name], MD       Future Appointments             In 3 days Stoioff, Verna Czech, MD [facility] [place]   In 1 month [place], Dorie Rank, NP [place] [facility], [facility]

## 2023-05-18 DIAGNOSIS — J301 Allergic rhinitis due to pollen: Secondary | ICD-10-CM | POA: Diagnosis not present

## 2023-05-18 LAB — PSA: Prostate Specific Ag, Serum: 1.6 ng/mL (ref 0.0–4.0)

## 2023-05-20 ENCOUNTER — Encounter: Payer: Self-pay | Admitting: Urology

## 2023-05-20 ENCOUNTER — Ambulatory Visit: Payer: Medicare HMO | Admitting: Urology

## 2023-05-20 VITALS — BP 120/77 | HR 76 | Ht 73.0 in | Wt 165.0 lb

## 2023-05-20 DIAGNOSIS — C61 Malignant neoplasm of prostate: Secondary | ICD-10-CM

## 2023-05-20 NOTE — Progress Notes (Signed)
Jonathan Roberson,acting as a scribe for Riki Altes, MD.,have documented all relevant documentation on the behalf of Riki Altes, MD,as directed by  Riki Altes, MD while in the presence of Riki Altes, MD.  05/20/23 10:22 AM   Jonathan Roberson. [birth year] [id number]  Referring provider: Marjie Skiff, NP [address] [place],  [place] [address]  Chief Complaint  Patient presents with   Prostate Cancer   Urologic history: 1.  Prostate cancer [id number] unfavorable intermediate risk PSA 5.9 on 01/28/2022; DRE firm left prostate MRI 01/2022 PI-RADS 5 lesion left PZ; PI-RADS 4 lesion right PZ; volume 29 cc MR fusion biopsy [place] 03/24/2022; prostate volume 29 cc; 18 cores obtain; 2 ROI biopsies were positive for Gleason 3+4 adenocarcinoma in 1 Gleason 3+3 adenocarcinoma; 10/12 template biopsies were positive for Gleason 3+3/3+4/4+3 with tissue involvement ranging from 10-80% PSMA/PET 04/14/2022 showed no evidence of metastatic disease I-125 interstitial implant 10/20/2022 ADT was recommended, however, he discontinued secondary to side effects  HPI: Jonathan Roberson. is a 67 y.o. male presents for post-op follow-up.  Was seen in radiation oncology 02/25/2023 and PSA was 2.0 Voiding symptoms have improved. Notes occasional urinary frequency and urgency PSA 05/17/2023 was 1.6   PMH: Past Medical History:  Diagnosis Date   Allergy    Allergy to dust    Arthritis    hands, hips, knees   Asthma    Cancer (HCC)    Depression    Hyperlipidemia    Hypertension    Hyperthyroidism     Surgical History: Past Surgical History:  Procedure Laterality Date   BIOPSY N/A 08/14/2016   Procedure: BIOPSY;  Surgeon: Midge Minium, MD;  Location: [facility];  Service: Endoscopy;  Laterality: N/A;   COLONOSCOPY WITH PROPOFOL N/A 08/14/2016   Procedure: COLONOSCOPY WITH PROPOFOL;  Surgeon: Midge Minium, MD;  Location: [facility];  Service: Endoscopy;   Laterality: N/A;   IMAGE GUIDED SINUS SURGERY Bilateral 09/24/2015   Procedure: IMAGE GUIDED SINUS SURGERY Bilateral nasal polypectomy,maxillary antrostomy with tissue removal,bilateral ethmoidectomy,frontal sinusotomy;  Surgeon: Geanie Logan, MD;  Location: [facility];  Service: ENT;  Laterality: Bilateral;  GAVE DISK TO CE CE 4-12 KP   NASAL SINUS SURGERY  2005   Dr. Gertie Baron, [facility]   RADIOACTIVE SEED IMPLANT N/A 10/20/2022   Procedure: RADIOACTIVE SEED IMPLANT/BRACHYTHERAPY IMPLANT;  Surgeon: Riki Altes, MD;  Location: [facility] ORS;  Service: Urology;  Laterality: N/A;  67 seeds implanted    Home Medications:  Allergies as of 05/20/2023       Reactions   Losartan Shortness Of Breath   Monosodium Glutamate    headaches   Amoxicillin Rash   Erythromycin Rash   Penicillins Rash        Medication List        Accurate as of May 20, 2023 10:22 AM. If you have any questions, ask your nurse or doctor.          albuterol (2.5 MG/3ML) 0.083% nebulizer solution Commonly known as: PROVENTIL USE 1 VIAL (3 ML) (2.5 MG TOTAL) VIA NEBULIZER EVERY 6 HOURS AS NEEDED FOR WHEEZING OR SHORTNESS OF BREATH   albuterol 108 (90 Base) MCG/ACT inhaler Commonly known as: VENTOLIN HFA Inhale 2 puffs into the lungs every 6 (six) hours as needed for wheezing or shortness of breath.   amLODipine 10 MG tablet Commonly known as: NORVASC Take 1 tablet (10 mg total) by mouth daily.  Aspirin Buf(CaCarb-MgCarb-MgO) 81 MG Tabs Take by mouth.   b complex vitamins capsule Take 1 capsule by mouth daily.   fluticasone 50 MCG/ACT nasal spray Commonly known as: FLONASE Place 2 sprays into both nostrils daily.   GLUCOSAMINE CHOND CMP DOUBLE PO Take 1,500 mg by mouth daily. Chondrotin 1200 mg   lisinopril 20 MG tablet Commonly known as: ZESTRIL Take 1 tablet (20 mg total) by mouth daily. What changed: how much to take   loratadine 10 MG tablet Commonly known as: CLARITIN Take  10 mg by mouth daily as needed for allergies.   methimazole 5 MG tablet Commonly known as: TAPAZOLE Take 0.5 tablets (2.5 mg total) by mouth daily.   NON FORMULARY Allergy injections weekly   Saw Palmetto 1000 MG Caps Take by mouth.   VITAMIN D (ERGOCALCIFEROL) PO Take 1 tablet by mouth daily.   Wixela Inhub 100-50 MCG/ACT Aepb Generic drug: fluticasone-salmeterol INHALE 1 PUFF INTO THE LUNGS TWICE DAILY   Zinc 100 MG Tabs Take by mouth.        Allergies:  Allergies  Allergen Reactions   Losartan Shortness Of Breath   Monosodium Glutamate     headaches   Amoxicillin Rash   Erythromycin Rash   Penicillins Rash    Family History: Family History  Problem Relation Age of Onset   Heart disease Mother    Depression Mother    Arthritis Sister    Emphysema Father    Depression Daughter     Social History:  reports that he has never smoked. He has never used smokeless tobacco. He reports that he does not currently use alcohol. He reports that he does not use drugs.   Physical Exam: BP 120/77   Pulse 76   Ht 6\' 1"  (1.854 m)   Wt 165 lb (74.8 kg)   BMI 21.77 kg/m   Constitutional:  Alert and oriented, No acute distress. HEENT: [place] AT Respiratory: Normal respiratory effort, no increased work of breathing. Psychiatric: Normal mood and affect.   Assessment & Plan:    1. Unfavorable intermediate risk prostate cancer PS decreasing Scheduled for radiation oncology follow up 07/2023 Follow up visit with me 10/2023 with PSA prior  I have reviewed the above documentation for accuracy and completeness, and I agree with the above.   Irineo Axon, MD  [facility] [address] [place], [place] [address] [phone]

## 2023-05-24 MED ORDER — ALBUTEROL SULFATE HFA 108 (90 BASE) MCG/ACT IN AERS: 2.0000 | INHALATION_SPRAY | Freq: Four times a day (QID) | RESPIRATORY_TRACT | 3 refills | Status: AC | PRN

## 2023-06-01 DIAGNOSIS — J301 Allergic rhinitis due to pollen: Secondary | ICD-10-CM | POA: Diagnosis not present

## 2023-06-05 DIAGNOSIS — Z008 Encounter for other general examination: Secondary | ICD-10-CM | POA: Diagnosis not present

## 2023-06-15 DIAGNOSIS — J301 Allergic rhinitis due to pollen: Secondary | ICD-10-CM | POA: Diagnosis not present

## 2023-06-27 NOTE — Patient Instructions (Signed)
**Note De-Identified Lodes Obfuscation** Be Involved in Caring For Your Health:  Taking Medications When medications are taken as directed, they can greatly improve your health. But if they are not taken as prescribed, they may not work. In some cases, not taking them correctly can be harmful. To help ensure your treatment remains effective and safe, understand your medications and how to take them. Bring your medications to each visit for review by your provider.  Your lab results, notes, and after visit summary will be available on My Chart. We strongly encourage you to use this feature. If lab results are abnormal the clinic will contact you with the appropriate steps. If the clinic does not contact you assume the results are satisfactory. You can always view your results on My Chart. If you have questions regarding your health or results, please contact the clinic during office hours. You can also ask questions on My Chart.  We at [facility] are grateful that you chose [place] to provide your care. We strive to provide evidence-based and compassionate care and are always looking for feedback. If you get a survey from the clinic please complete this so we can hear your opinions.  DASH Eating Plan DASH stands for Dietary Approaches to Stop Hypertension. The DASH eating plan is a healthy eating plan that has been shown to: Lower high blood pressure (hypertension). Reduce your risk for type 2 diabetes, heart disease, and stroke. Help with weight loss. What are tips for following this plan? Reading food labels Check food labels for the amount of salt (sodium) per serving. Choose foods with less than 5 percent of the Daily Value (DV) of sodium. In general, foods with less than 300 milligrams (mg) of sodium per serving fit into this eating plan. To find whole grains, look for the word "whole" as the first word in the ingredient list. Shopping Buy products labeled as "low-sodium" or "no salt added." Buy fresh foods. Avoid canned  foods and pre-made or frozen meals. Cooking Try not to add salt when you cook. Use salt-free seasonings or herbs instead of table salt or sea salt. Check with your health care provider or pharmacist before using salt substitutes. Do not fry foods. Cook foods in healthy ways, such as baking, boiling, grilling, roasting, or broiling. Cook using oils that are good for your heart. These include olive, canola, avocado, soybean, and sunflower oil. Meal planning  Eat a balanced diet. This should include: 4 or more servings of fruits and 4 or more servings of vegetables each day. Try to fill half of your plate with fruits and vegetables. 6-8 servings of whole grains each day. 6 or less servings of lean meat, poultry, or fish each day. 1 oz is 1 serving. A 3 oz (85 g) serving of meat is about the same size as the palm of your hand. One egg is 1 oz (28 g). 2-3 servings of low-fat dairy each day. One serving is 1 cup (237 mL). 1 serving of nuts, seeds, or beans 5 times each week. 2-3 servings of heart-healthy fats. Healthy fats called omega-3 fatty acids are found in foods such as walnuts, flaxseeds, fortified milks, and eggs. These fats are also found in cold-water fish, such as sardines, salmon, and mackerel. Limit how much you eat of: Canned or prepackaged foods. Food that is high in trans fat, such as fried foods. Food that is high in saturated fat, such as fatty meat. Desserts and other sweets, sugary drinks, and other foods with added sugar. Full-fat **Note De-Identified [place]iden Obfuscation** dairy products. Do not salt foods before eating. Do not eat more than 4 egg yolks a week. Try to eat at least 2 vegetarian meals a week. Eat more home-cooked food and less restaurant, buffet, and fast food. Lifestyle When eating at a restaurant, ask if your food can be made with less salt or no salt. If you drink alcohol: Limit how much you have to: 0-1 drink a day if you are male. 0-2 drinks a day if you are male. Know how much alcohol is in  your drink. In the [place] one drink is one 12 oz bottle of beer (355 mL), one 5 oz glass of wine (148 mL), or one 1 oz glass of hard liquor (44 mL). General information Avoid eating more than 2,300 mg of salt a day. If you have hypertension, you may need to reduce your sodium intake to 1,500 mg a day. Work with your provider to stay at a healthy body weight or lose weight. Ask what the best weight range is for you. On most days of the week, get at least 30 minutes of exercise that causes your heart to beat faster. This may include walking, swimming, or biking. Work with your provider or dietitian to adjust your eating plan to meet your specific calorie needs. What foods should I eat? Fruits All fresh, dried, or frozen fruit. Canned fruits that are in their natural juice and do not have sugar added to them. Vegetables Fresh or frozen vegetables that are raw, steamed, roasted, or grilled. Low-sodium or reduced-sodium tomato and vegetable juice. Low-sodium or reduced-sodium tomato sauce and tomato paste. Low-sodium or reduced-sodium canned vegetables. Grains Whole-grain or whole-wheat bread. Whole-grain or whole-wheat pasta. Brown rice. Orpah Cobb. Bulgur. Whole-grain and low-sodium cereals. Pita bread. Low-fat, low-sodium crackers. Whole-wheat flour tortillas. Meats and other proteins Skinless chicken or [place]. Ground chicken or [place]. Pork with fat trimmed off. Fish and seafood. Egg whites. Dried beans, peas, or lentils. Unsalted nuts, nut butters, and seeds. Unsalted canned beans. Lean cuts of beef with fat trimmed off. Low-sodium, lean precooked or cured meat, such as sausages or meat loaves. Dairy Low-fat (1%) or fat-free (skim) milk. Reduced-fat, low-fat, or fat-free cheeses. Nonfat, low-sodium ricotta or cottage cheese. Low-fat or nonfat yogurt. Low-fat, low-sodium cheese. Fats and oils Soft margarine without trans fats. Vegetable oil. Reduced-fat, low-fat, or light mayonnaise and salad  dressings (reduced-sodium). Canola, safflower, olive, avocado, soybean, and sunflower oils. Avocado. Seasonings and condiments Herbs. Spices. Seasoning mixes without salt. Other foods Unsalted popcorn and pretzels. Fat-free sweets. The items listed above may not be all the foods and drinks you can have. Talk to a dietitian to learn more. What foods should I avoid? Fruits Canned fruit in a light or heavy syrup. Fried fruit. Fruit in cream or butter sauce. Vegetables Creamed or fried vegetables. Vegetables in a cheese sauce. Regular canned vegetables that are not marked as low-sodium or reduced-sodium. Regular canned tomato sauce and paste that are not marked as low-sodium or reduced-sodium. Regular tomato and vegetable juices that are not marked as low-sodium or reduced-sodium. Rosita Fire. Olives. Grains Baked goods made with fat, such as croissants, muffins, or some breads. Dry pasta or rice meal packs. Meats and other proteins Fatty cuts of meat. Ribs. Fried meat. Tomasa Blase. Bologna, salami, and other precooked or cured meats, such as sausages or meat loaves, that are not lean and low in sodium. Fat from the back of a pig (fatback). Bratwurst. Salted nuts and seeds. Canned beans with added salt. Canned **Note De-Identified [place] Obfuscation** or smoked fish. Whole eggs or egg yolks. Chicken or [place] with skin. Dairy Whole or 2% milk, cream, and half-and-half. Whole or full-fat cream cheese. Whole-fat or sweetened yogurt. Full-fat cheese. Nondairy creamers. Whipped toppings. Processed cheese and cheese spreads. Fats and oils Butter. Stick margarine. Lard. Shortening. Ghee. Bacon fat. Tropical oils, such as coconut, palm kernel, or palm oil. Seasonings and condiments Onion salt, garlic salt, seasoned salt, table salt, and sea salt. Worcestershire sauce. Tartar sauce. Barbecue sauce. Teriyaki sauce. Soy sauce, including reduced-sodium soy sauce. Steak sauce. Canned and packaged gravies. Fish sauce. Oyster sauce. Cocktail sauce. Store-bought  horseradish. Ketchup. Mustard. Meat flavorings and tenderizers. Bouillon cubes. Hot sauces. Pre-made or packaged marinades. Pre-made or packaged taco seasonings. Relishes. Regular salad dressings. Other foods Salted popcorn and pretzels. The items listed above may not be all the foods and drinks you should avoid. Talk to a dietitian to learn more. Where to find more information National Heart, Lung, and Blood Institute (NHLBI): BuffaloDryCleaner.gl American Heart Association (AHA): heart.org Academy of Nutrition and Dietetics: eatright.org National Kidney Foundation (NKF): kidney.org This information is not intended to replace advice given to you by your health care provider. Make sure you discuss any questions you have with your health care provider. Document Revised: 06/04/2022 Document Reviewed: 06/04/2022 Elsevier Patient Education  2024 [employer].

## 2023-06-28 ENCOUNTER — Encounter: Payer: Self-pay | Admitting: Nurse Practitioner

## 2023-06-29 DIAGNOSIS — J301 Allergic rhinitis due to pollen: Secondary | ICD-10-CM | POA: Diagnosis not present

## 2023-07-01 ENCOUNTER — Encounter: Payer: Self-pay | Admitting: Nurse Practitioner

## 2023-07-01 ENCOUNTER — Ambulatory Visit (INDEPENDENT_AMBULATORY_CARE_PROVIDER_SITE_OTHER): Payer: Medicare HMO | Admitting: Nurse Practitioner

## 2023-07-01 VITALS — BP 126/75 | HR 66 | Temp 97.8°F | Ht 74.2 in | Wt 169.0 lb

## 2023-07-01 DIAGNOSIS — E7800 Pure hypercholesterolemia, unspecified: Secondary | ICD-10-CM | POA: Diagnosis not present

## 2023-07-01 DIAGNOSIS — F3342 Major depressive disorder, recurrent, in full remission: Secondary | ICD-10-CM | POA: Diagnosis not present

## 2023-07-01 DIAGNOSIS — J4540 Moderate persistent asthma, uncomplicated: Secondary | ICD-10-CM

## 2023-07-01 DIAGNOSIS — E871 Hypo-osmolality and hyponatremia: Secondary | ICD-10-CM | POA: Diagnosis not present

## 2023-07-01 DIAGNOSIS — Z0000 Encounter for general adult medical examination without abnormal findings: Secondary | ICD-10-CM

## 2023-07-01 DIAGNOSIS — I10 Essential (primary) hypertension: Secondary | ICD-10-CM

## 2023-07-01 DIAGNOSIS — E559 Vitamin D deficiency, unspecified: Secondary | ICD-10-CM | POA: Diagnosis not present

## 2023-07-01 DIAGNOSIS — C61 Malignant neoplasm of prostate: Secondary | ICD-10-CM | POA: Diagnosis not present

## 2023-07-01 DIAGNOSIS — E0590 Thyrotoxicosis, unspecified without thyrotoxic crisis or storm: Secondary | ICD-10-CM

## 2023-07-01 NOTE — Assessment & Plan Note (Signed)
Chronic, stable without medication.  Continue to follow and restart medication as needed. Denies SI/HI.  Utilizes exercise for mood, which is beneficial.

## 2023-07-01 NOTE — Assessment & Plan Note (Signed)
Diagnosed 05/08/22.  Followed by endocrinology.  Continue current medication regimen as ordered by them.  Recent notes reviewed.  Thyroid labs up to date.

## 2023-07-01 NOTE — Assessment & Plan Note (Signed)
Chronic, ongoing.  BP at goal in office today.  Recommend he monitor BP at least a few mornings a week at home and document.  DASH diet at home.  Continue current medication regimen and adjust as needed.  Labs today: CBC, CMP, TSH. If elevations >130/80 then add hydrochlorothiazide 12.5 MG.

## 2023-07-01 NOTE — Assessment & Plan Note (Signed)
Diagnosed 04/14/22, followed by urology. Recent notes reviewed from urology and oncology.  Has been very diet focused.  PSA today per his request.

## 2023-07-01 NOTE — Assessment & Plan Note (Signed)
Chronic, stable with minimal use of Albuterol.  Continue to monitor and adjust as needed.  Refills as needed.

## 2023-07-01 NOTE — Assessment & Plan Note (Signed)
Chronic, ongoing.  He refuses statin at this time and wishes to continue to focus on lifestyle changes + current diet changes.  Discussed risk with current ASCVD score and recommendations for statin.  Will recheck lipid panel today.

## 2023-07-01 NOTE — Progress Notes (Signed)
BP 126/75   Pulse 66   Temp 97.8 F (36.6 C) (Oral)   Ht 6' 2.2" (1.885 m)   Wt 169 lb (76.7 kg)   SpO2 96%   BMI 21.58 kg/m    Subjective:    Patient ID: Jonathan Roberson., male    DOB: November 03, 1955, 68 y.o.   MRN: [id number]  HPI: Jonathan Roberson. is a 68 y.o. male presenting on 07/01/2023 for comprehensive medical examination. Current medical complaints include:none  He currently lives with: wife Interim Problems from his last visit: no  HYPERTENSION / HYPERLIPIDEMIA Continues Amlodipine 10 MG & Lisinopril 40 MG daily - refuses statin at this time, works on diet and exercise. Satisfied with current treatment? yes Duration of hypertension: chronic BP monitoring frequency: daily BP range: <130/80 on average BP medication side effects: no Duration of hyperlipidemia: chronic Cholesterol supplements: none Aspirin: no Recent stressors: no Recurrent headaches: no Visual changes: no Palpitations: no Dyspnea: no Chest pain: no Lower extremity edema: no Dizzy/lightheaded: no The 10-year ASCVD risk score (Arnett DK, et al., 2019) is: 11.5%   Values used to calculate the score:     Age: 68 years     Sex: Male     Is Non-Hispanic African American: No     Diabetic: No     Tobacco smoker: No     Systolic Blood Pressure: 126 mmHg     Is BP treated: Yes     HDL Cholesterol: 71 mg/dL     Total Cholesterol: 146 mg/dL  ASTHMA Uses Wixela in the evening, can tell if misses it. Albuterol PRN. Asthma status: controlled Satisfied with current treatment?: yes Albuterol/rescue inhaler frequency: only if sick Dyspnea frequency: no Wheezing frequency: no Cough frequency: no Nocturnal symptom frequency: no Limitation of activity: no Current upper respiratory symptoms: no Aerochamber/spacer use: no Visits to ER or Urgent Care in past year: no Pneumovax: Up to Date Influenza:  refused    PROSTATE CANCER 1-125 interstitial implant 10/20/22.  Saw urology last 05/20/23 & oncology  02/25/23. He would like PSA checked today. Duration: months Nocturia: often gets up -- but difficulty coming out Urinary frequency:no Incomplete voiding: no Urgency: no Weak urinary stream: yes Straining to start stream: yes  HYPERTHYROID Last saw endo on 03/08/23. Continues Methimazole, is down to half a pill on this. Fatigue: no Cold intolerance: no Heat intolerance: no Weight gain: no Weight loss: no Constipation: no Diarrhea/loose stools: no Palpitations: no Lower extremity edema: no Anxiety/depressed mood: occasional if does not exercise  DEPRESSION No current medications, took Paxil in past. Mood status: stable Satisfied with current treatment?: yes Symptom severity: moderate  Duration of current treatment : chronic Side effects: no Medication compliance: good compliance Depressed mood: occasional if does not exercise Anxious mood: occasional if does not exercise Anhedonia: no Significant weight loss or gain: no Insomnia: no Fatigue: no Feelings of worthlessness or guilt: no Impaired concentration/indecisiveness: no Suicidal ideations: no Hopelessness: no Crying spells: no    07/01/2023   10:27 AM 03/30/2023    3:13 PM 01/28/2023   10:28 AM 10/13/2022    2:33 PM 07/30/2022   10:50 AM  Depression screen PHQ 2/9  Decreased Interest 1 0 1  1  Down, Depressed, Hopeless 1 0 1 1 1   PHQ - 2 Score 2 0 2 1 2   Altered sleeping 1 1 1 1  0  Tired, decreased energy 1 0 0 1 0  Change in appetite 0 0 0  0  Feeling bad or failure about yourself  0 0 0  0  Trouble concentrating 0 1 0  0  Moving slowly or fidgety/restless 0 1 0  0  Suicidal thoughts 0 0 0  0  PHQ-9 Score 4 3 3 3 2   Difficult doing work/chores Not difficult at all Not difficult at all Somewhat difficult  Somewhat difficult       07/01/2023   10:27 AM 03/30/2023    3:13 PM 01/28/2023   10:29 AM 07/30/2022   10:50 AM  GAD 7 : Generalized Anxiety Score  Nervous, Anxious, on Edge 1 1 1  0  Control/stop  worrying 0 0 1 0  Worry too much - different things 0 0 0 0  Trouble relaxing 1 0 0 0  Restless 1 0 0 0  Easily annoyed or irritable 0 1 1 0  Afraid - awful might happen 0 1 0 0  Total GAD 7 Score 3 3 3  0  Anxiety Difficulty Not difficult at all Somewhat difficult Somewhat difficult Not difficult at all   Functional Status Survey: Is the patient deaf or have difficulty hearing?: No Does the patient have difficulty seeing, even when wearing glasses/contacts?: No Does the patient have difficulty concentrating, remembering, or making decisions?: No Does the patient have difficulty walking or climbing stairs?: No Does the patient have difficulty dressing or bathing?: No Does the patient have difficulty doing errands alone such as visiting a doctor's office or shopping?: No  FALL RISK:    07/01/2023   10:27 AM 03/30/2023    3:13 PM 10/13/2022    2:36 PM 07/30/2022   10:50 AM 01/28/2022   11:31 AM  Fall Risk   Falls in the past year? 0 0 0 0 0  Number falls in past yr: 0 0 0 0 0  Injury with Fall? 0 0 0 0 0  Risk for fall due to : No Fall Risks No Fall Risks No Fall Risks No Fall Risks No Fall Risks  Follow up Falls evaluation completed Falls evaluation completed Falls prevention discussed;Falls evaluation completed Falls evaluation completed Falls evaluation completed   Functional Status Survey: Is the patient deaf or have difficulty hearing?: No Does the patient have difficulty seeing, even when wearing glasses/contacts?: No Does the patient have difficulty concentrating, remembering, or making decisions?: No Does the patient have difficulty walking or climbing stairs?: No Does the patient have difficulty dressing or bathing?: No Does the patient have difficulty doing errands alone such as visiting a doctor's office or shopping?: No   Advanced Directives Does patient have a HCPOA?    yes If yes, name and contact information:  Does patient have a living will or MOST form?   yes  Past Medical History:  Past Medical History:  Diagnosis Date   Allergy    Allergy to dust    Arthritis    hands, hips, knees   Asthma    Cancer (HCC)    Depression    Hyperlipidemia    Hypertension    Hyperthyroidism     Surgical History:  Past Surgical History:  Procedure Laterality Date   BIOPSY N/A 08/14/2016   Procedure: BIOPSY;  Surgeon: Midge Minium, MD;  Location: [facility];  Service: Endoscopy;  Laterality: N/A;   COLONOSCOPY WITH PROPOFOL N/A 08/14/2016   Procedure: COLONOSCOPY WITH PROPOFOL;  Surgeon: Midge Minium, MD;  Location: [facility];  Service: Endoscopy;  Laterality: N/A;   IMAGE GUIDED SINUS SURGERY Bilateral 09/24/2015   Procedure:  IMAGE GUIDED SINUS SURGERY Bilateral nasal polypectomy,maxillary antrostomy with tissue removal,bilateral ethmoidectomy,frontal sinusotomy;  Surgeon: Geanie Logan, MD;  Location: [facility];  Service: ENT;  Laterality: Bilateral;  GAVE DISK TO CE CE 4-12 KP   NASAL SINUS SURGERY  2005   Dr. Gertie Baron, [facility]   RADIOACTIVE SEED IMPLANT N/A 10/20/2022   Procedure: RADIOACTIVE SEED IMPLANT/BRACHYTHERAPY IMPLANT;  Surgeon: Riki Altes, MD;  Location: [facility];  Service: Urology;  Laterality: N/A;  67 seeds implanted    Medications:  Current Outpatient Medications on File Prior to Visit  Medication Sig   albuterol (PROVENTIL) (2.5 MG/3ML) 0.083% nebulizer solution USE 1 VIAL (3 ML) (2.5 MG TOTAL) VIA NEBULIZER EVERY 6 HOURS AS NEEDED FOR WHEEZING OR SHORTNESS OF BREATH   albuterol (VENTOLIN HFA) 108 (90 Base) MCG/ACT inhaler Inhale 2 puffs into the lungs every 6 (six) hours as needed for wheezing or shortness of breath.   amLODipine (NORVASC) 10 MG tablet Take 1 tablet (10 mg total) by mouth daily.   Aspirin Buf,CaCarb-MgCarb-MgO, 81 MG TABS Take by mouth.   b complex vitamins capsule Take 1 capsule by mouth daily.   fluticasone (FLONASE) 50 MCG/ACT nasal spray Place 2 sprays into both nostrils daily.    fluticasone-salmeterol (WIXELA INHUB) 100-50 MCG/ACT AEPB INHALE 1 PUFF INTO THE LUNGS TWICE DAILY   lisinopril (ZESTRIL) 20 MG tablet Take 1 tablet (20 mg total) by mouth daily. (Patient taking differently: Take 40 mg by mouth daily.)   loratadine (CLARITIN) 10 MG tablet Take 10 mg by mouth daily as needed for allergies.   methimazole (TAPAZOLE) 5 MG tablet Take 0.5 tablets (2.5 mg total) by mouth daily.   Misc Natural Products (GLUCOSAMINE CHOND CMP DOUBLE PO) Take 1,500 mg by mouth daily. Chondrotin 1200 mg   NON FORMULARY Allergy injections weekly   Saw Palmetto 1000 MG CAPS Take by mouth.   VITAMIN D, ERGOCALCIFEROL, PO Take 1 tablet by mouth daily.   Zinc 100 MG TABS Take by mouth.   No current facility-administered medications on file prior to visit.    Allergies:  Allergies  Allergen Reactions   Losartan Shortness Of Breath   Monosodium Glutamate     headaches   Amoxicillin Rash   Erythromycin Rash   Penicillins Rash    Social History:  Social History   Socioeconomic History   Marital status: Married    Spouse name: Not on file   Number of children: Not on file   Years of education: Not on file   Highest education level: Not on file  Occupational History   Not on file  Tobacco Use   Smoking status: Never   Smokeless tobacco: Never  Vaping Use   Vaping status: Never Used  Substance and Sexual Activity   Alcohol use: Not Currently    Alcohol/week: 0.0 standard drinks of alcohol    Comment: on occasion- about once a year   Drug use: No   Sexual activity: Yes  Other Topics Concern   Not on file  Social History Narrative   Not on file   Social Drivers of Health   Financial Resource Strain: Low Risk  (10/13/2022)   Overall Financial Resource Strain (CARDIA)    Difficulty of Paying Living Expenses: Not hard at all  Food Insecurity: No Food Insecurity (10/13/2022)   Hunger Vital Sign    Worried About Running Out of Food in the Last Year: Never true    Ran  Out of Food in the Last Year:  Never true  Transportation Needs: No Transportation Needs (10/13/2022)   PRAPARE - Administrator, Civil Service (Medical): No    Lack of Transportation (Non-Medical): No  Physical Activity: Sufficiently Active (10/13/2022)   Exercise Vital Sign    Days of Exercise per Week: 7 days    Minutes of Exercise per Session: 60 min  Stress: Stress Concern Present (10/13/2022)   [employer] of Occupational Health - Occupational Stress Questionnaire    Feeling of Stress : To some extent  Social Connections: Moderately Integrated (10/13/2022)   Social Connection and Isolation Panel [NHANES]    Frequency of Communication with Friends and Family: More than three times a week    Frequency of Social Gatherings with Friends and Family: Twice a week    Attends Religious Services: More than 4 times per year    Active Member of [employer] or Organizations: No    Attends Banker Meetings: Never    Marital Status: Married  Catering manager Violence: Not At Risk (10/13/2022)   Humiliation, Afraid, Rape, and Kick questionnaire    Fear of Current or Ex-Partner: No    Emotionally Abused: No    Physically Abused: No    Sexually Abused: No   Social History   Tobacco Use  Smoking Status Never  Smokeless Tobacco Never   Social History   Substance and Sexual Activity  Alcohol Use Not Currently   Alcohol/week: 0.0 standard drinks of alcohol   Comment: on occasion- about once a year    Family History:  Family History  Problem Relation Age of Onset   Heart disease Mother    Depression Mother    Arthritis Sister    Emphysema Father    Depression Daughter    Past medical history, surgical history, medications, allergies, family history and social history reviewed with patient today and changes made to appropriate areas of the chart.   ROS All other ROS negative except what is listed above and in the HPI.      Objective:    BP 126/75   Pulse 66    Temp 97.8 F (36.6 C) (Oral)   Ht 6' 2.2" (1.885 m)   Wt 169 lb (76.7 kg)   SpO2 96%   BMI 21.58 kg/m   Wt Readings from Last 3 Encounters:  07/01/23 169 lb (76.7 kg)  05/20/23 165 lb (74.8 kg)  03/30/23 166 lb 9.6 oz (75.6 kg)    Physical Exam Vitals and nursing note reviewed.  Constitutional:      General: He is awake. He is not in acute distress.    Appearance: He is well-developed and well-groomed. He is not ill-appearing or toxic-appearing.  HENT:     Head: Normocephalic and atraumatic.     Right Ear: Hearing, tympanic membrane, ear canal and external ear normal. No drainage.     Left Ear: Hearing, tympanic membrane, ear canal and external ear normal. No drainage.     Nose: Nose normal.     Mouth/Throat:     Pharynx: Uvula midline.  Eyes:     General: Lids are normal.        Right eye: No discharge.        Left eye: No discharge.     Extraocular Movements: Extraocular movements intact.     Conjunctiva/sclera: Conjunctivae normal.     Pupils: Pupils are equal, round, and reactive to light.     Visual Fields: Right eye visual fields normal and left eye visual fields  normal.  Neck:     Thyroid: No thyromegaly.     Vascular: No carotid bruit or JVD.     Trachea: Trachea normal.  Cardiovascular:     Rate and Rhythm: Normal rate and regular rhythm.     Heart sounds: Normal heart sounds, S1 normal and S2 normal. No murmur heard.    No gallop.  Pulmonary:     Effort: Pulmonary effort is normal. No accessory muscle usage or respiratory distress.     Breath sounds: Normal breath sounds.  Abdominal:     General: Bowel sounds are normal.     Palpations: Abdomen is soft. There is no hepatomegaly or splenomegaly.     Tenderness: There is no abdominal tenderness.  Musculoskeletal:        General: Normal range of motion.     Cervical back: Normal range of motion and neck supple.     Right lower leg: No edema.     Left lower leg: No edema.  Lymphadenopathy:     Head:      Right side of head: No submental, submandibular, tonsillar, preauricular or posterior auricular adenopathy.     Left side of head: No submental, submandibular, tonsillar, preauricular or posterior auricular adenopathy.     Cervical: No cervical adenopathy.  Skin:    General: Skin is warm and dry.     Capillary Refill: Capillary refill takes less than 2 seconds.     Findings: No rash.  Neurological:     Mental Status: He is alert and oriented to person, place, and time.     Gait: Gait is intact.     Deep Tendon Reflexes: Reflexes are normal and symmetric.     Reflex Scores:      Brachioradialis reflexes are 2+ on the right side and 2+ on the left side.      Patellar reflexes are 2+ on the right side and 2+ on the left side. Psychiatric:        Attention and Perception: Attention normal.        Mood and Affect: Mood normal.        Speech: Speech normal.        Behavior: Behavior normal. Behavior is cooperative.        Thought Content: Thought content normal.        Cognition and Memory: Cognition normal.       10/13/2022    2:39 PM  6CIT Screen  What Year? 0 points  What month? 0 points  What time? 0 points  Count back from 20 2 points  Months in reverse 0 points  Repeat phrase 0 points  Total Score 2 points   Results for orders placed or performed in visit on 05/17/23  PSA   Collection Time: 05/17/23  2:41 PM  Result Value Ref Range   Prostate Specific Ag, Serum 1.6 0.0 - 4.0 ng/mL      Assessment & Plan:   Problem List Items Addressed This Visit       Cardiovascular and Mediastinum   Essential hypertension, benign   Chronic, ongoing.  BP at goal in office today.  Recommend he monitor BP at least a few mornings a week at home and document.  DASH diet at home.  Continue current medication regimen and adjust as needed.  Labs today: CBC, CMP, TSH. If elevations >130/80 then add hydrochlorothiazide 12.5 MG.       Relevant Orders   CBC with Differential/Platelet    Comprehensive metabolic panel  Respiratory   Moderate persistent asthma without complication   Chronic, stable with minimal use of Albuterol.  Continue to monitor and adjust as needed.  Refills as needed.        Endocrine   Hyperthyroidism   Diagnosed 05/08/22.  Followed by endocrinology.  Continue current medication regimen as ordered by them.  Recent notes reviewed.  Thyroid labs up to date.        Genitourinary   Prostate cancer [facility]) - Primary   Diagnosed 04/14/22, followed by urology. Recent notes reviewed from urology and oncology.  Has been very diet focused.  PSA today per his request.      Relevant Orders   PSA     Other   Hyperlipidemia   Chronic, ongoing.  He refuses statin at this time and wishes to continue to focus on lifestyle changes + current diet changes.  Discussed risk with current ASCVD score and recommendations for statin.  Will recheck lipid panel today.      Relevant Orders   Comprehensive metabolic panel   Lipid Panel w/o Chol/HDL Ratio   Hyponatremia   Stable. No longer on Paxil. Recheck today.      Relevant Orders   Comprehensive metabolic panel   Major depression in full remission (HCC)   Chronic, stable without medication.  Continue to follow and restart medication as needed. Denies SI/HI.  Utilizes exercise for mood, which is beneficial.      Vitamin D deficiency   Chronic, ongoing.  Check Vit D level today and recommend continue daily supplement.      Relevant Orders   VITAMIN D 25 Hydroxy (Vit-D Deficiency, Fractures)   Other Visit Diagnoses       Encounter for annual physical exam       Annual physical today, health maintenance reviewed.       Discussed aspirin prophylaxis for myocardial infarction prevention and decision was it was not indicated  LABORATORY TESTING:  Health maintenance labs ordered today as discussed above.   The natural history of prostate cancer and ongoing controversy regarding screening and potential  treatment outcomes of prostate cancer has been discussed with the patient. The meaning of a false positive PSA and a false negative PSA has been discussed. He indicates understanding of the limitations of this screening test and wishes to proceed with screening PSA testing. Monitored by urology.  IMMUNIZATIONS:   - Tdap: Tetanus vaccination status reviewed: refused. - Influenza: Refused - Pneumovax: Up to date - Prevnar: Up to date - Zostavax vaccine: Refused  SCREENING: - Colonoscopy: will place referral in future  Discussed with patient purpose of the colonoscopy is to detect colon cancer at curable precancerous or early stages   - AAA Screening: Not applicable  -Hearing Test: Not applicable  -Spirometry: Not applicable   PATIENT COUNSELING:    Sexuality: Discussed sexually transmitted diseases, partner selection, use of condoms, avoidance of unintended pregnancy  and contraceptive alternatives.   Advised to avoid cigarette smoking.  I discussed with the patient that most people either abstain from alcohol or drink within safe limits (<=14/week and <=4 drinks/occasion for males, <=7/weeks and <= 3 drinks/occasion for females) and that the risk for alcohol disorders and other health effects rises proportionally with the number of drinks per week and how often a drinker exceeds daily limits.  Discussed cessation/primary prevention of drug use and availability of treatment for abuse.   Diet: Encouraged to adjust caloric intake to maintain  or achieve ideal body weight, to reduce intake  of dietary saturated fat and total fat, to limit sodium intake by avoiding high sodium foods and not adding table salt, and to maintain adequate dietary potassium and calcium preferably from fresh fruits, vegetables, and low-fat dairy products.    Stressed the importance of regular exercise  Injury prevention: Discussed safety belts, safety helmets, smoke detector, smoking near bedding or upholstery.    Dental health: Discussed importance of regular tooth brushing, flossing, and dental visits.   Follow up plan: NEXT PREVENTATIVE PHYSICAL DUE IN 1 YEAR. Return in about 6 months (around 12/29/2023) for HTN/HLD, MOOD, THYROID, CANCER.

## 2023-07-01 NOTE — Assessment & Plan Note (Signed)
Stable. No longer on Paxil. Recheck today.

## 2023-07-01 NOTE — Assessment & Plan Note (Signed)
Chronic, ongoing.  Check Vit D level today and recommend continue daily supplement.

## 2023-07-02 ENCOUNTER — Encounter: Payer: Self-pay | Admitting: Nurse Practitioner

## 2023-07-02 DIAGNOSIS — I10 Essential (primary) hypertension: Secondary | ICD-10-CM

## 2023-07-02 DIAGNOSIS — E0590 Thyrotoxicosis, unspecified without thyrotoxic crisis or storm: Secondary | ICD-10-CM

## 2023-07-02 LAB — COMPREHENSIVE METABOLIC PANEL
ALT: 20 [IU]/L (ref 0–44)
AST: 22 [IU]/L (ref 0–40)
Albumin: 4.6 g/dL (ref 3.9–4.9)
Alkaline Phosphatase: 100 [IU]/L (ref 44–121)
BUN/Creatinine Ratio: 11 (ref 10–24)
BUN: 8 mg/dL (ref 8–27)
Bilirubin Total: 1.4 mg/dL — ABNORMAL HIGH (ref 0.0–1.2)
CO2: 26 mmol/L (ref 20–29)
Calcium: 9.5 mg/dL (ref 8.6–10.2)
Chloride: 94 mmol/L — ABNORMAL LOW (ref 96–106)
Creatinine, Ser: 0.75 mg/dL — ABNORMAL LOW (ref 0.76–1.27)
Globulin, Total: 3.2 g/dL (ref 1.5–4.5)
Glucose: 73 mg/dL (ref 70–99)
Potassium: 4 mmol/L (ref 3.5–5.2)
Sodium: 136 mmol/L (ref 134–144)
Total Protein: 7.8 g/dL (ref 6.0–8.5)
eGFR: 99 mL/min/{1.73_m2} (ref >59)

## 2023-07-02 LAB — CBC WITH DIFFERENTIAL/PLATELET
Basophils Absolute: 0 10*3/uL (ref 0.0–0.2)
Basos: 1 %
EOS (ABSOLUTE): 0.2 10*3/uL (ref 0.0–0.4)
Eos: 5 %
Hematocrit: 41.6 % (ref 37.5–51.0)
Hemoglobin: 14.2 g/dL (ref 13.0–17.7)
Immature Grans (Abs): 0 10*3/uL (ref 0.0–0.1)
Immature Granulocytes: 0 %
Lymphocytes Absolute: 0.6 10*3/uL — ABNORMAL LOW (ref 0.7–3.1)
Lymphs: 19 %
MCH: 32.8 pg (ref 26.6–33.0)
MCHC: 34.1 g/dL (ref 31.5–35.7)
MCV: 96 fL (ref 79–97)
Monocytes Absolute: 0.4 10*3/uL (ref 0.1–0.9)
Monocytes: 13 %
Neutrophils Absolute: 2 10*3/uL (ref 1.4–7.0)
Neutrophils: 62 %
Platelets: 196 10*3/uL (ref 150–450)
RBC: 4.33 x10E6/uL (ref 4.14–5.80)
RDW: 12 % (ref 11.6–15.4)
WBC: 3.3 10*3/uL — ABNORMAL LOW (ref 3.4–10.8)

## 2023-07-02 LAB — LIPID PANEL W/O CHOL/HDL RATIO
Cholesterol, Total: 179 mg/dL (ref 100–199)
HDL: 89 mg/dL (ref >39)
LDL Chol Calc (NIH): 81 mg/dL (ref 0–99)
Triglycerides: 41 mg/dL (ref 0–149)
VLDL Cholesterol Cal: 9 mg/dL (ref 5–40)

## 2023-07-02 LAB — PSA: Prostate Specific Ag, Serum: 1.8 ng/mL (ref 0.0–4.0)

## 2023-07-02 LAB — VITAMIN D 25 HYDROXY (VIT D DEFICIENCY, FRACTURES): Vit D, 25-Hydroxy: 37.7 ng/mL (ref 30.0–100.0)

## 2023-07-06 DIAGNOSIS — J301 Allergic rhinitis due to pollen: Secondary | ICD-10-CM | POA: Diagnosis not present

## 2023-07-07 ENCOUNTER — Telehealth: Payer: Self-pay

## 2023-07-07 NOTE — Telephone Encounter (Signed)
 Needs lab only appt. Ok for [facility] to review and schedule.

## 2023-07-07 NOTE — Telephone Encounter (Signed)
Patient called, left VM to return the call to the office for lab results.  ?

## 2023-07-07 NOTE — Telephone Encounter (Signed)
-----   Message from JOLENE T [facility] sent at 07/02/2023  8:08 AM EST ----- Contacted via MyChart -- lab only visit in 4 weeks please:)   Good morning Jonathan Roberson, your labs have returned: - CBC shows slightly low white blood cell count and lymphocytes, these can vary for many reasons.  I would like to recheck in 4 weeks via an outpatient lab, staff will call to schedule. - Kidney function, creatinine and eGFR, remains normal, as is liver function, AST and ALT.  - Remainder of labs, including PSA, look great:) Any questions? Keep being inspiring!!  Thank you for allowing me to participate in your care.  I appreciate you. Kindest regards, Jolene

## 2023-07-08 ENCOUNTER — Ambulatory Visit: Payer: Self-pay

## 2023-07-08 MED ORDER — LISINOPRIL 40 MG PO TABS: 40.0000 mg | ORAL_TABLET | Freq: Every day | ORAL | 4 refills | Status: DC

## 2023-07-08 NOTE — Telephone Encounter (Signed)
 Chief Complaint: Need to increase lisinopril  to 40 mg Disposition: [] ED /[] Urgent Care (no appt availability in office) / [] Appointment(In office/virtual)/ []  [place] [facility]/ [] Home Care/ [] Refused Recommended Disposition /[] [place] [facility]/ [x]  Follow-up with PCP Additional Notes: Patient says at one office visit with Jolene she told him to take his BP 2 hours after taking his medication and to take an extra lisinopril  if it's up. He says he has just been taking 40 mg of Lisinopril  daily and since he has 20 mg pills, they are running out. He spoke to the pharmacy and was told he will need a new prescription. He says he never got it, so he's asking if he can get one sent to the pharmacy. He also says that he will combine his labs that he will have drawn on 08/18/23 to include what Jolene wants to eliminate so many sticks from all the doctors he goes to. He says he would like to pick up a hard copy from the office today of the requisition, in case he decides to go to a lab corp not inside the hospital. Advised I will send this to  Jolene. Advised he can stop by the office and if it's not at the front desk, they will be able to print it for him. He verbalized understanding.     Summary: Lisinopril  dosage change and lab orders?   The patient called in stating he would like to speak with his provider about a medication and dosage change. He states his provider has changed his lisinopril  (ZESTRIL ) 20 MG tablet to 40 mg. He is asking for a new script for #90 supply of the 40 mg to be sent to his pharmacy if that is ok because he has been going through the 20 mg too fast.  [facility] [place], [place] - [address] Phone: [phone] Fax: [phone]   He also states when a nurse calls him back if they can help with a lab order that he spoke with his provider about for next month as he had recent labs and wants to wait a little while cause he gets stuck all the time and needs a little  break     Reason for Disposition  [1] Caller has NON-URGENT medicine question about med that PCP prescribed AND [2] triager unable to answer question  Answer Assessment - Initial Assessment Questions 1. NAME of MEDICINE: What medicine(s) are you calling about?     Lisinopril  40 mg 2. QUESTION: What is your question? (e.g., double dose of medicine, side effect)     I need a new prescription 3. PRESCRIBER: Who prescribed the medicine? Reason: if prescribed by specialist, call should be referred to that group.     Jolene  Protocols used: Medication Question Call-A-AH

## 2023-07-08 NOTE — Telephone Encounter (Signed)
 Called and LVM letting patient know of Jolene's message.

## 2023-07-08 NOTE — Telephone Encounter (Signed)
 Patient called in a triage encounter regarding a medication dosage change and he mentioned that he would like to pick up the requisitions for the labs to have them drawn on 08/15/23 when he goes to have labs drawn for another provider. He says he messaged to Jolene about combining. Advised to stop by the office to pick up the requisitions and that I will send this to Jolene.

## 2023-07-12 ENCOUNTER — Telehealth: Payer: Self-pay | Admitting: Nurse Practitioner

## 2023-07-12 NOTE — Telephone Encounter (Signed)
 Lab request has been printed. Up front for patient to pick up.

## 2023-07-12 NOTE — Telephone Encounter (Signed)
 Pt is calling to request orders for blood work. Pt will be by the office in about a hour to pick up. Please advise CB- [phone]

## 2023-07-13 DIAGNOSIS — J301 Allergic rhinitis due to pollen: Secondary | ICD-10-CM | POA: Diagnosis not present

## 2023-07-20 DIAGNOSIS — J301 Allergic rhinitis due to pollen: Secondary | ICD-10-CM | POA: Diagnosis not present

## 2023-07-23 DIAGNOSIS — J301 Allergic rhinitis due to pollen: Secondary | ICD-10-CM | POA: Diagnosis not present

## 2023-07-26 DIAGNOSIS — J301 Allergic rhinitis due to pollen: Secondary | ICD-10-CM | POA: Diagnosis not present

## 2023-07-27 DIAGNOSIS — J301 Allergic rhinitis due to pollen: Secondary | ICD-10-CM | POA: Diagnosis not present

## 2023-07-29 ENCOUNTER — Encounter: Payer: Self-pay | Admitting: "Endocrinology

## 2023-08-01 ENCOUNTER — Encounter: Payer: Self-pay | Admitting: Nurse Practitioner

## 2023-08-03 DIAGNOSIS — J301 Allergic rhinitis due to pollen: Secondary | ICD-10-CM | POA: Diagnosis not present

## 2023-08-09 ENCOUNTER — Other Ambulatory Visit: Payer: Self-pay | Admitting: Nurse Practitioner

## 2023-08-10 DIAGNOSIS — J301 Allergic rhinitis due to pollen: Secondary | ICD-10-CM | POA: Diagnosis not present

## 2023-08-12 ENCOUNTER — Other Ambulatory Visit: Payer: Self-pay | Admitting: *Deleted

## 2023-08-12 DIAGNOSIS — R768 Other specified abnormal immunological findings in serum: Secondary | ICD-10-CM

## 2023-08-12 DIAGNOSIS — E0590 Thyrotoxicosis, unspecified without thyrotoxic crisis or storm: Secondary | ICD-10-CM

## 2023-08-12 DIAGNOSIS — I10 Essential (primary) hypertension: Secondary | ICD-10-CM

## 2023-08-12 DIAGNOSIS — E782 Mixed hyperlipidemia: Secondary | ICD-10-CM

## 2023-08-12 DIAGNOSIS — R946 Abnormal results of thyroid function studies: Secondary | ICD-10-CM

## 2023-08-12 NOTE — Telephone Encounter (Signed)
Lab orders have been ordered.

## 2023-08-16 ENCOUNTER — Telehealth: Payer: Self-pay | Admitting: *Deleted

## 2023-08-16 NOTE — Telephone Encounter (Signed)
 Called patient to inform him that we could not add our lab onto another providers order and vice versa. Patient verbalized understanding.

## 2023-08-16 NOTE — Telephone Encounter (Signed)
 Pt has PSA done 3/19 with [facility] and he has orders for T4 free,  T77free, tsh cbc/d. Some from  [place] and some from [place], [place] . He wants them to combine for all of them and at 1 stick.

## 2023-08-17 ENCOUNTER — Other Ambulatory Visit: Payer: Self-pay | Admitting: Nurse Practitioner

## 2023-08-17 ENCOUNTER — Telehealth: Payer: Self-pay

## 2023-08-17 DIAGNOSIS — J301 Allergic rhinitis due to pollen: Secondary | ICD-10-CM | POA: Diagnosis not present

## 2023-08-17 DIAGNOSIS — E0590 Thyrotoxicosis, unspecified without thyrotoxic crisis or storm: Secondary | ICD-10-CM | POA: Diagnosis not present

## 2023-08-17 DIAGNOSIS — I10 Essential (primary) hypertension: Secondary | ICD-10-CM | POA: Diagnosis not present

## 2023-08-17 NOTE — Telephone Encounter (Signed)
 Called patient back this afternoon to schedule him for his colonoscopy with Dr. Servando Snare.  He requested to call office back when he is ready to schedule.  Mychart message sent to patient with our office information to contact when he is ready.  Thanks,  [place], [place]

## 2023-08-17 NOTE — Telephone Encounter (Signed)
 Pt is here in office. He would like a call to schedule colonoscopy., His last one was 07-2016

## 2023-08-18 ENCOUNTER — Inpatient Hospital Stay: Payer: Medicare HMO | Attending: Radiation Oncology

## 2023-08-18 ENCOUNTER — Encounter: Payer: Self-pay | Admitting: Nurse Practitioner

## 2023-08-18 DIAGNOSIS — C61 Malignant neoplasm of prostate: Secondary | ICD-10-CM | POA: Diagnosis not present

## 2023-08-18 LAB — CBC WITH DIFFERENTIAL/PLATELET
Basophils Absolute: 0 10*3/uL (ref 0.0–0.2)
Basos: 1 %
EOS (ABSOLUTE): 0.2 10*3/uL (ref 0.0–0.4)
Eos: 4 %
Hematocrit: 40.6 % (ref 37.5–51.0)
Hemoglobin: 14 g/dL (ref 13.0–17.7)
Immature Grans (Abs): 0 10*3/uL (ref 0.0–0.1)
Immature Granulocytes: 0 %
Lymphocytes Absolute: 0.9 10*3/uL (ref 0.7–3.1)
Lymphs: 24 %
MCH: 32.4 pg (ref 26.6–33.0)
MCHC: 34.5 g/dL (ref 31.5–35.7)
MCV: 94 fL (ref 79–97)
Monocytes Absolute: 0.5 10*3/uL (ref 0.1–0.9)
Monocytes: 13 %
Neutrophils Absolute: 2.1 10*3/uL (ref 1.4–7.0)
Neutrophils: 58 %
Platelets: 192 10*3/uL (ref 150–450)
RBC: 4.32 x10E6/uL (ref 4.14–5.80)
RDW: 12.2 % (ref 11.6–15.4)
WBC: 3.7 10*3/uL (ref 3.4–10.8)

## 2023-08-18 LAB — T4, FREE: Free T4: 1.35 ng/dL (ref 0.82–1.77)

## 2023-08-18 LAB — TSH: TSH: 1.52 u[IU]/mL (ref 0.450–4.500)

## 2023-08-18 LAB — T3, FREE: T3, Free: 2.8 pg/mL (ref 2.0–4.4)

## 2023-08-18 LAB — PSA: Prostatic Specific Antigen: 1.95 ng/mL (ref 0.00–4.00)

## 2023-08-18 NOTE — Progress Notes (Signed)
 Contacted via MyChart   Overall all labs stable. I did send thyroid labs to Dr. Fransico Him:)

## 2023-08-18 NOTE — Progress Notes (Signed)
 Thyroid labs for Jonathan Roberson, he wanted me to get them here for you:)

## 2023-08-21 NOTE — Patient Instructions (Signed)
 Be Involved in Caring For Your Health:  Taking Medications When medications are taken as directed, they can greatly improve your health. But if they are not taken as prescribed, they may not work. In some cases, not taking them correctly can be harmful. To help ensure your treatment remains effective and safe, understand your medications and how to take them. Bring your medications to each visit for review by your provider.  Your lab results, notes, and after visit summary will be available on My Chart. We strongly encourage you to use this feature. If lab results are abnormal the clinic will contact you with the appropriate steps. If the clinic does not contact you assume the results are satisfactory. You can always view your results on My Chart. If you have questions regarding your health or results, please contact the clinic during office hours. You can also ask questions on My Chart.  We at [facility] are grateful that you chose [place] to provide your care. We strive to provide evidence-based and compassionate care and are always looking for feedback. If you get a survey from the clinic please complete this so we can hear your opinions.  Heart-Healthy Eating Plan Many factors influence your heart health, including eating and exercise habits. Heart health is also called coronary health. Coronary risk increases with abnormal blood fat (lipid) levels. A heart-healthy eating plan includes limiting unhealthy fats, increasing healthy fats, limiting salt (sodium) intake, and making other diet and lifestyle changes. What is my plan? Your health care provider may recommend that: You limit your fat intake to _________% or less of your total calories each day. You limit your saturated fat intake to _________% or less of your total calories each day. You limit the amount of cholesterol in your diet to less than _________ mg per day. You limit the amount of sodium in your diet to less than _________  mg per day. What are tips for following this plan? Cooking Cook foods using methods other than frying. Baking, boiling, grilling, and broiling are all good options. Other ways to reduce fat include: Removing the skin from poultry. Removing all visible fats from meats. Steaming vegetables in water or broth. Meal planning  At meals, imagine dividing your plate into fourths: Fill one-half of your plate with vegetables and green salads. Fill one-fourth of your plate with whole grains. Fill one-fourth of your plate with lean protein foods. Eat 2-4 cups of vegetables per day. One cup of vegetables equals 1 cup (91 g) broccoli or cauliflower florets, 2 medium carrots, 1 large bell pepper, 1 large sweet potato, 1 large tomato, 1 medium white potato, 2 cups (150 g) raw leafy greens. Eat 1-2 cups of fruit per day. One cup of fruit equals 1 small apple, 1 large banana, 1 cup (237 g) mixed fruit, 1 large orange,  cup (82 g) dried fruit, 1 cup (240 mL) 100% fruit juice. Eat more foods that contain soluble fiber. Examples include apples, broccoli, carrots, beans, peas, and barley. Aim to get 25-30 g of fiber per day. Increase your consumption of legumes, nuts, and seeds to 4-5 servings per week. One serving of dried beans or legumes equals  cup (90 g) cooked, 1 serving of nuts is  oz (12 almonds, 24 pistachios, or 7 walnut halves), and 1 serving of seeds equals  oz (8 g). Fats Choose healthy fats more often. Choose monounsaturated and polyunsaturated fats, such as olive and canola oils, avocado oil, flaxseeds, walnuts, almonds, and seeds. Eat  more omega-3 fats. Choose salmon, mackerel, sardines, tuna, flaxseed oil, and ground flaxseeds. Aim to eat fish at least 2 times each week. Check food labels carefully to identify foods with trans fats or high amounts of saturated fat. Limit saturated fats. These are found in animal products, such as meats, butter, and cream. Plant sources of saturated fats  include palm oil, palm kernel oil, and coconut oil. Avoid foods with partially hydrogenated oils in them. These contain trans fats. Examples are stick margarine, some tub margarines, cookies, crackers, and other baked goods. Avoid fried foods. General information Eat more home-cooked food and less restaurant, buffet, and fast food. Limit or avoid alcohol. Limit foods that are high in added sugar and simple starches such as foods made using white refined flour (white breads, pastries, sweets). Lose weight if you are overweight. Losing just 5-10% of your body weight can help your overall health and prevent diseases such as diabetes and heart disease. Monitor your sodium intake, especially if you have high blood pressure. Talk with your health care provider about your sodium intake. Try to incorporate more vegetarian meals weekly. What foods should I eat? Fruits All fresh, canned (in natural juice), or frozen fruits. Vegetables Fresh or frozen vegetables (raw, steamed, roasted, or grilled). Green salads. Grains Most grains. Choose whole wheat and whole grains most of the time. Rice and pasta, including brown rice and pastas made with whole wheat. Meats and other proteins Lean, well-trimmed beef, veal, pork, and lamb. Chicken and [place] without skin. All fish and shellfish. Wild duck, rabbit, pheasant, and venison. Egg whites or low-cholesterol egg substitutes. Dried beans, peas, lentils, and tofu. Seeds and most nuts. Dairy Low-fat or nonfat cheeses, including ricotta and mozzarella. Skim or 1% milk (liquid, powdered, or evaporated). Buttermilk made with low-fat milk. Nonfat or low-fat yogurt. Fats and oils Non-hydrogenated (trans-free) margarines. Vegetable oils, including soybean, sesame, sunflower, olive, avocado, peanut, safflower, corn, canola, and cottonseed. Salad dressings or mayonnaise made with a vegetable oil. Beverages Water (mineral or sparkling). Coffee and tea. Unsweetened ice  tea. Diet beverages. Sweets and desserts Sherbet, gelatin, and fruit ice. Small amounts of dark chocolate. Limit all sweets and desserts. Seasonings and condiments All seasonings and condiments. The items listed above may not be a complete list of foods and beverages you can eat. Contact a dietitian for more options. What foods should I avoid? Fruits Canned fruit in heavy syrup. Fruit in cream or butter sauce. Fried fruit. Limit coconut. Vegetables Vegetables cooked in cheese, cream, or butter sauce. Fried vegetables. Grains Breads made with saturated or trans fats, oils, or whole milk. Croissants. Sweet rolls. Donuts. High-fat crackers, such as cheese crackers and chips. Meats and other proteins Fatty meats, such as hot dogs, ribs, sausage, bacon, rib-eye roast or steak. High-fat deli meats, such as salami and bologna. Caviar. Domestic duck and goose. Organ meats, such as liver. Dairy Cream, sour cream, cream cheese, and creamed cottage cheese. Whole-milk cheeses. Whole or 2% milk (liquid, evaporated, or condensed). Whole buttermilk. Cream sauce or high-fat cheese sauce. Whole-milk yogurt. Fats and oils Meat fat, or shortening. Cocoa butter, hydrogenated oils, palm oil, coconut oil, palm kernel oil. Solid fats and shortenings, including bacon fat, salt pork, lard, and butter. Nondairy cream substitutes. Salad dressings with cheese or sour cream. Beverages Regular sodas and any drinks with added sugar. Sweets and desserts Frosting. Pudding. Cookies. Cakes. Pies. Milk chocolate or white chocolate. Buttered syrups. Full-fat ice cream or ice cream drinks. The items listed above may  not be a complete list of foods and beverages to avoid. Contact a dietitian for more information. Summary Heart-healthy meal planning includes limiting unhealthy fats, increasing healthy fats, limiting salt (sodium) intake and making other diet and lifestyle changes. Lose weight if you are overweight. Losing just  5-10% of your body weight can help your overall health and prevent diseases such as diabetes and heart disease. Focus on eating a balance of foods, including fruits and vegetables, low-fat or nonfat dairy, lean protein, nuts and legumes, whole grains, and heart-healthy oils and fats. This information is not intended to replace advice given to you by your health care provider. Make sure you discuss any questions you have with your health care provider. Document Revised: 06/23/2021 Document Reviewed: 06/23/2021 Elsevier Patient Education  2024 [employer].

## 2023-08-23 ENCOUNTER — Encounter: Payer: Self-pay | Admitting: Nurse Practitioner

## 2023-08-23 ENCOUNTER — Ambulatory Visit (INDEPENDENT_AMBULATORY_CARE_PROVIDER_SITE_OTHER): Admitting: Nurse Practitioner

## 2023-08-23 VITALS — BP 111/73 | HR 71 | Temp 97.7°F | Ht 74.0 in | Wt 169.6 lb

## 2023-08-23 DIAGNOSIS — I10 Essential (primary) hypertension: Secondary | ICD-10-CM | POA: Diagnosis not present

## 2023-08-23 MED ORDER — PAROXETINE HCL 20 MG PO TABS: 20.0000 mg | ORAL_TABLET | Freq: Every day | ORAL | 3 refills | Status: AC

## 2023-08-23 NOTE — Progress Notes (Signed)
 BP 111/73   Pulse 71   Temp 97.7 F (36.5 C) (Oral)   Ht 6\' 2"  (1.88 m)   Wt 169 lb 9.6 oz (76.9 kg)   SpO2 91%   BMI 21.78 kg/m    Subjective:    Patient ID: Jonathan Maker., male    DOB: 1955/06/26, 68 y.o.   MRN: [id number]  HPI: Jonathan Ritchey. is a 68 y.o. male  Chief Complaint  Patient presents with   Hypertension   HYPERTENSION without Chronic Kidney Disease Taking Amlodipine and Lisinopril.  Reports he is having a little cough, fatigue, + dizziness when changing position which he thinks is from Lisinopril. Has self reduced his Lisinopril to 30 MG. Hypertension status: stable  Satisfied with current treatment? yes Duration of hypertension: chronic BP monitoring frequency:  daily BP range: 124/78 in PM, 121/86, 129/81, 133/88 BP medication side effects:  no Medication compliance: good compliance Aspirin: no Recurrent headaches: no Visual changes: no Palpitations: no Dyspnea: no Chest pain: no Lower extremity edema: no Dizzy/lightheaded: no   Relevant past medical, surgical, family and social history reviewed and updated as indicated. Interim medical history since our last visit reviewed. Allergies and medications reviewed and updated.  Review of Systems  Constitutional:  Negative for activity change, diaphoresis, fatigue and fever.  Respiratory:  Negative for cough, chest tightness, shortness of breath and wheezing.   Cardiovascular:  Negative for chest pain, palpitations and leg swelling.  Gastrointestinal: Negative.   Endocrine: Negative for cold intolerance and heat intolerance.  Neurological: Negative.   Psychiatric/Behavioral: Negative.      Per HPI unless specifically indicated above     Objective:    BP 111/73   Pulse 71   Temp 97.7 F (36.5 C) (Oral)   Ht 6\' 2"  (1.88 m)   Wt 169 lb 9.6 oz (76.9 kg)   SpO2 91%   BMI 21.78 kg/m   Wt Readings from Last 3 Encounters:  08/23/23 169 lb 9.6 oz (76.9 kg)  07/01/23 169 lb (76.7 kg)   05/20/23 165 lb (74.8 kg)    Physical Exam Vitals and nursing note reviewed.  Constitutional:      General: He is awake. He is not in acute distress.    Appearance: He is well-developed and well-groomed. He is not ill-appearing or toxic-appearing.  HENT:     Head: Normocephalic.     Right Ear: Hearing and external ear normal.     Left Ear: Hearing and external ear normal.  Eyes:     General: Lids are normal.     Extraocular Movements: Extraocular movements intact.     Conjunctiva/sclera: Conjunctivae normal.  Neck:     Thyroid: No thyromegaly.     Vascular: No carotid bruit.  Cardiovascular:     Rate and Rhythm: Normal rate and regular rhythm.     Heart sounds: Normal heart sounds. No murmur heard.    No gallop.  Pulmonary:     Effort: No accessory muscle usage or respiratory distress.     Breath sounds: Normal breath sounds.  Abdominal:     General: Bowel sounds are normal. There is no distension.     Palpations: Abdomen is soft.     Tenderness: There is no abdominal tenderness.  Musculoskeletal:     Cervical back: Full passive range of motion without pain.     Right lower leg: No edema.     Left lower leg: No edema.  Lymphadenopathy:     Cervical:  No cervical adenopathy.  Skin:    General: Skin is warm.     Capillary Refill: Capillary refill takes less than 2 seconds.  Neurological:     Mental Status: He is alert and oriented to person, place, and time.     Deep Tendon Reflexes: Reflexes are normal and symmetric.     Reflex Scores:      Brachioradialis reflexes are 2+ on the right side and 2+ on the left side.      Patellar reflexes are 2+ on the right side and 2+ on the left side. Psychiatric:        Attention and Perception: Attention normal.        Mood and Affect: Mood normal.        Speech: Speech normal.        Behavior: Behavior normal. Behavior is cooperative.        Thought Content: Thought content normal.    Results for orders placed or performed in  visit on 08/18/23  PSA   Collection Time: 08/18/23  9:48 AM  Result Value Ref Range   Prostatic Specific Antigen 1.95 0.00 - 4.00 ng/mL      Assessment & Plan:   Problem List Items Addressed This Visit       Cardiovascular and Mediastinum   Essential hypertension, benign - Primary   Chronic, ongoing.  BP at goal in office today and on average at goal at home.  He would like to reduce Lisinopril more, however is concerned for BP elevations.  Discussed with him that we could trial Zestoretic 10 or 20 - 12.5 MG dosing. He will think about this since there is diuretic in it.  Would avoid BB due to active lifestyle and lower HR at baseline. Recommend he monitor BP at least a few mornings a week at home and document.  DASH diet at home.  Continue current medication regimen and adjust as needed.  Labs today: up to date. If elevations >130/80 then add hydrochlorothiazide 12.5 MG -- as noted above in Zestoretic.       Relevant Medications   lisinopril (ZESTRIL) 20 MG tablet     Follow up plan: Return for as scheduled 12/31/23.

## 2023-08-23 NOTE — Assessment & Plan Note (Signed)
 Chronic, ongoing.  BP at goal in office today and on average at goal at home.  He would like to reduce Lisinopril more, however is concerned for BP elevations.  Discussed with him that we could trial Zestoretic 10 or 20 - 12.5 MG dosing. He will think about this since there is diuretic in it.  Would avoid BB due to active lifestyle and lower HR at baseline. Recommend he monitor BP at least a few mornings a week at home and document.  DASH diet at home.  Continue current medication regimen and adjust as needed.  Labs today: up to date. If elevations >130/80 then add hydrochlorothiazide 12.5 MG -- as noted above in Zestoretic.

## 2023-08-24 DIAGNOSIS — J301 Allergic rhinitis due to pollen: Secondary | ICD-10-CM | POA: Diagnosis not present

## 2023-08-25 ENCOUNTER — Encounter: Payer: Self-pay | Admitting: Radiation Oncology

## 2023-08-25 ENCOUNTER — Ambulatory Visit
Admission: RE | Admit: 2023-08-25 | Discharge: 2023-08-25 | Disposition: A | Discharge status: Home / Self Care | Payer: Medicare HMO | Source: Ambulatory Visit | Attending: Radiation Oncology | Admitting: Radiation Oncology

## 2023-08-25 VITALS — BP 131/88 | HR 63 | Temp 97.8°F | Resp 18

## 2023-08-25 DIAGNOSIS — Z923 Personal history of irradiation: Secondary | ICD-10-CM | POA: Diagnosis not present

## 2023-08-25 DIAGNOSIS — R351 Nocturia: Secondary | ICD-10-CM | POA: Diagnosis not present

## 2023-08-25 DIAGNOSIS — C61 Malignant neoplasm of prostate: Secondary | ICD-10-CM | POA: Diagnosis not present

## 2023-08-25 DIAGNOSIS — R350 Frequency of micturition: Secondary | ICD-10-CM | POA: Insufficient documentation

## 2023-08-25 DIAGNOSIS — Z191 Hormone sensitive malignancy status: Secondary | ICD-10-CM | POA: Diagnosis not present

## 2023-08-25 NOTE — Progress Notes (Signed)
 Radiation Oncology Follow up Note  Name: Lora Glomski.   Date:   08/25/2023 MRN:  [id number] DOB: [birth year]    This 68 y.o. 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  REFERRING PROVIDER: Marjie Skiff, NP  HPI: Patient is a 68 year old male now out 10 months having completed I-125 interstitial implant for Gleason 7 (3+4) adenocarcinoma the prostate.  Seen today in routine follow-up he still has frequency of urination.  He is also having nocturia greater than 5.  He has refused medication in the past although we discussed that today and he will try some Flomax.  His most recent PSA is 1.9 down slightly from 2.06 months prior  COMPLICATIONS OF TREATMENT: none  FOLLOW UP COMPLIANCE: keeps appointments   PHYSICAL EXAM:  BP 131/88 (BP Location: Right Arm, Patient Position: Sitting, Cuff Size: Normal)   Pulse 63   Temp 97.8 F (36.6 C) (Tympanic)   Resp 18   SpO2 98%  Well-developed well-nourished patient in NAD. HEENT reveals PERLA, EOMI, discs not visualized.  Oral cavity is clear. No oral mucosal lesions are identified. Neck is clear without evidence of cervical or supraclavicular adenopathy. Lungs are clear to A&P. Cardiac examination is essentially unremarkable with regular rate and rhythm without murmur rub or thrill. Abdomen is benign with no organomegaly or masses noted. Motor sensory and DTR levels are equal and symmetric in the upper and lower extremities. Cranial nerves II through XII are grossly intact. Proprioception is intact. No peripheral adenopathy or edema is identified. No motor or sensory levels are noted. Crude visual fields are within normal range.  RADIOLOGY RESULTS: No current films for review  PLAN: Present time patient is doing well's PSA is stable over time.  I am pleased with his overall progress from starting him on Flomax to be  used after dinner in the evenings.  Of asked him to start it and continue for at least 2 weeks to evaluate for his response to the medication.  I have otherwise asked to see him back in 6 months for follow-up with repeat PSA.  Patient knows to call with any concerns.  I would like to take this opportunity to thank you for allowing me to participate in the care of your patient.Carmina Miller, MD

## 2023-08-31 DIAGNOSIS — J301 Allergic rhinitis due to pollen: Secondary | ICD-10-CM | POA: Diagnosis not present

## 2023-09-06 MED ORDER — HYDROCHLOROTHIAZIDE 12.5 MG PO TABS: 12.5000 mg | ORAL_TABLET | Freq: Every day | ORAL | 4 refills | Status: DC

## 2023-09-07 DIAGNOSIS — J301 Allergic rhinitis due to pollen: Secondary | ICD-10-CM | POA: Diagnosis not present

## 2023-09-14 ENCOUNTER — Encounter: Payer: Self-pay | Admitting: Cardiology

## 2023-09-14 DIAGNOSIS — J301 Allergic rhinitis due to pollen: Secondary | ICD-10-CM | POA: Diagnosis not present

## 2023-09-16 ENCOUNTER — Ambulatory Visit: Payer: Medicare HMO | Admitting: "Endocrinology

## 2023-09-21 DIAGNOSIS — J301 Allergic rhinitis due to pollen: Secondary | ICD-10-CM | POA: Diagnosis not present

## 2023-09-28 DIAGNOSIS — J301 Allergic rhinitis due to pollen: Secondary | ICD-10-CM | POA: Diagnosis not present

## 2023-09-30 ENCOUNTER — Other Ambulatory Visit: Payer: Self-pay | Admitting: *Deleted

## 2023-09-30 ENCOUNTER — Telehealth: Payer: Self-pay | Admitting: Urology

## 2023-09-30 DIAGNOSIS — R972 Elevated prostate specific antigen [PSA]: Secondary | ICD-10-CM

## 2023-09-30 DIAGNOSIS — C61 Malignant neoplasm of prostate: Secondary | ICD-10-CM

## 2023-09-30 NOTE — Telephone Encounter (Signed)
 Yes I will put a order in for [place] . Thanks

## 2023-09-30 NOTE — Telephone Encounter (Signed)
 Pt called today and said that he has a Lab appt on 5/21 but wants to know if he can just get his labs done at the [facility] because he already needs to have other labs done there and that is right down the road from his work. Can you change his orders so he can get them added down there.

## 2023-10-03 ENCOUNTER — Encounter: Payer: Self-pay | Admitting: Nurse Practitioner

## 2023-10-05 DIAGNOSIS — J301 Allergic rhinitis due to pollen: Secondary | ICD-10-CM | POA: Diagnosis not present

## 2023-10-11 DIAGNOSIS — J301 Allergic rhinitis due to pollen: Secondary | ICD-10-CM | POA: Diagnosis not present

## 2023-10-12 ENCOUNTER — Other Ambulatory Visit
Admission: RE | Admit: 2023-10-12 | Discharge: 2023-10-12 | Disposition: A | Discharge status: Home / Self Care | Attending: "Endocrinology | Admitting: "Endocrinology

## 2023-10-12 ENCOUNTER — Other Ambulatory Visit
Admission: RE | Admit: 2023-10-12 | Discharge: 2023-10-12 | Disposition: A | Discharge status: Home / Self Care | Source: Home / Self Care | Attending: Urology | Admitting: Urology

## 2023-10-12 DIAGNOSIS — J301 Allergic rhinitis due to pollen: Secondary | ICD-10-CM | POA: Diagnosis not present

## 2023-10-12 DIAGNOSIS — E0590 Thyrotoxicosis, unspecified without thyrotoxic crisis or storm: Secondary | ICD-10-CM | POA: Insufficient documentation

## 2023-10-12 DIAGNOSIS — C61 Malignant neoplasm of prostate: Secondary | ICD-10-CM | POA: Insufficient documentation

## 2023-10-12 DIAGNOSIS — E782 Mixed hyperlipidemia: Secondary | ICD-10-CM | POA: Diagnosis not present

## 2023-10-12 DIAGNOSIS — R972 Elevated prostate specific antigen [PSA]: Secondary | ICD-10-CM | POA: Insufficient documentation

## 2023-10-12 DIAGNOSIS — E559 Vitamin D deficiency, unspecified: Secondary | ICD-10-CM | POA: Diagnosis not present

## 2023-10-12 LAB — PSA: Prostatic Specific Antigen: 3.03 ng/mL (ref 0.00–4.00)

## 2023-10-12 LAB — LIPID PANEL
Cholesterol: 172 mg/dL (ref 0–200)
HDL: 85 mg/dL (ref >40)
LDL Cholesterol: 80 mg/dL (ref 0–99)
Total CHOL/HDL Ratio: 2 ratio
Triglycerides: 37 mg/dL (ref <150)
VLDL: 7 mg/dL (ref 0–40)

## 2023-10-12 LAB — VITAMIN D 25 HYDROXY (VIT D DEFICIENCY, FRACTURES): Vit D, 25-Hydroxy: 43.82 ng/mL (ref 30–100)

## 2023-10-12 LAB — T4, FREE: Free T4: 0.91 ng/dL (ref 0.61–1.12)

## 2023-10-12 LAB — TSH: TSH: 0.902 u[IU]/mL (ref 0.350–4.500)

## 2023-10-13 LAB — T3, FREE: T3, Free: 2.9 pg/mL (ref 2.0–4.4)

## 2023-10-15 ENCOUNTER — Encounter: Payer: Self-pay | Admitting: "Endocrinology

## 2023-10-15 ENCOUNTER — Ambulatory Visit: Admitting: "Endocrinology

## 2023-10-15 VITALS — BP 120/82 | HR 60 | Ht 74.0 in | Wt 170.8 lb

## 2023-10-15 DIAGNOSIS — E0590 Thyrotoxicosis, unspecified without thyrotoxic crisis or storm: Secondary | ICD-10-CM

## 2023-10-15 DIAGNOSIS — E782 Mixed hyperlipidemia: Secondary | ICD-10-CM

## 2023-10-15 NOTE — Progress Notes (Signed)
 10/15/2023, 10:53 AM  Endocrinology follow-up note               Subjective:    Patient ID: Jonathan Kil., male    DOB: 1955-12-24, PCP Lemar Pyles, NP   Past Medical History:  Diagnosis Date   Allergy    Allergy to dust    Arthritis    hands, hips, knees   Asthma    Cancer (HCC)    Depression    Hyperlipidemia    Hypertension    Hyperthyroidism    Past Surgical History:  Procedure Laterality Date   BIOPSY N/A 08/14/2016   Procedure: BIOPSY;  Surgeon: Marnee Sink, MD;  Location: [facility];  Service: Endoscopy;  Laterality: N/A;   COLONOSCOPY WITH PROPOFOL  N/A 08/14/2016   Procedure: COLONOSCOPY WITH PROPOFOL ;  Surgeon: Marnee Sink, MD;  Location: [facility];  Service: Endoscopy;  Laterality: N/A;   IMAGE GUIDED SINUS SURGERY Bilateral 09/24/2015   Procedure: IMAGE GUIDED SINUS SURGERY Bilateral nasal polypectomy,maxillary antrostomy with tissue removal,bilateral ethmoidectomy,frontal sinusotomy;  Surgeon: Von Grumbling, MD;  Location: [facility];  Service: ENT;  Laterality: Bilateral;  GAVE DISK TO CE CE 4-12 KP   NASAL SINUS SURGERY  2005   Dr. Meryl Acosta, [facility]   RADIOACTIVE SEED IMPLANT N/A 10/20/2022   Procedure: RADIOACTIVE SEED IMPLANT/BRACHYTHERAPY IMPLANT;  Surgeon: Geraline Knapp, MD;  Location: [facility];  Service: Urology;  Laterality: N/A;  67 seeds implanted   Social History   Socioeconomic History   Marital status: Married    Spouse name: Not on file   Number of children: Not on file   Years of education: Not on file   Highest education level: Not on file  Occupational History   Not on file  Tobacco Use   Smoking status: Never   Smokeless tobacco: Never  Vaping Use   Vaping status: Never Used  Substance and Sexual Activity   Alcohol use: Not Currently    Alcohol/week: 0.0 standard drinks of alcohol    Comment: on occasion- about once a year   Drug use: No    Sexual activity: Yes  Other Topics Concern   Not on file  Social History Narrative   Not on file   Social Drivers of Health   Financial Resource Strain: Low Risk  (10/13/2022)   Overall Financial Resource Strain (CARDIA)    Difficulty of Paying Living Expenses: Not hard at all  Food Insecurity: No Food Insecurity (10/13/2022)   Hunger Vital Sign    Worried About Running Out of Food in the Last Year: Never true    Ran Out of Food in the Last Year: Never true  Transportation Needs: No Transportation Needs (10/13/2022)   PRAPARE - Administrator, Civil Service (Medical): No    Lack of Transportation (Non-Medical): No  Physical Activity: Sufficiently Active (10/13/2022)   Exercise Vital Sign    Days of Exercise per Week: 7 days    Minutes of Exercise per Session: 60 min  Stress: Stress Concern Present (10/13/2022)   [employer] of Occupational Health - Occupational Stress Questionnaire    Feeling of Stress : To some extent  Social Connections: Moderately Integrated (  10/13/2022)   Social Connection and Isolation Panel [NHANES]    Frequency of Communication with Friends and Family: More than three times a week    Frequency of Social Gatherings with Friends and Family: Twice a week    Attends Religious Services: More than 4 times per year    Active Member of [employer] or Organizations: No    Attends Banker Meetings: Never    Marital Status: Married   Family History  Problem Relation Age of Onset   Heart disease Mother    Depression Mother    Arthritis Sister    Emphysema Father    Depression Daughter    Outpatient Encounter Medications as of 10/15/2023  Medication Sig   albuterol  (PROVENTIL ) (2.5 MG/3ML) 0.083% nebulizer solution USE 1 VIAL (3 ML) (2.5 MG TOTAL) VIA NEBULIZER EVERY 6 HOURS AS NEEDED FOR WHEEZING OR SHORTNESS OF BREATH   albuterol  (VENTOLIN  HFA) 108 (90 Base) MCG/ACT inhaler Inhale 2 puffs into the lungs every 6 (six) hours as needed for  wheezing or shortness of breath.   amLODipine  (NORVASC ) 10 MG tablet Take 1 tablet (10 mg total) by mouth daily.   ascorbic acid (VITAMIN C) 500 MG tablet Take 500 mg by mouth daily.   Aspirin Buf,CaCarb-MgCarb-MgO, 81 MG TABS Take by mouth.   b complex vitamins capsule Take 1 capsule by mouth daily.   fluticasone  (FLONASE ) 50 MCG/ACT nasal spray PLACE 2 SPRAYS INTO BOTH NOSTRILS ONCE DAILY   fluticasone -salmeterol (WIXELA INHUB) 100-50 MCG/ACT AEPB INHALE 1 PUFF INTO THE LUNGS TWICE DAILY   hydrochlorothiazide  (HYDRODIURIL ) 12.5 MG tablet Take 1 tablet (12.5 mg total) by mouth daily.   Krill Oil (OMEGA-3) 500 MG CAPS Take 1 capsule by mouth daily at 2 PM.   lisinopril  (ZESTRIL ) 20 MG tablet Take 20 mg by mouth daily.   loratadine (CLARITIN) 10 MG tablet Take 10 mg by mouth daily as needed for allergies.   Misc Natural Products (GLUCOSAMINE CHOND CMP DOUBLE PO) Take 1,500 mg by mouth daily. Chondrotin 1200 mg   NON FORMULARY Allergy injections weekly   PARoxetine  (PAXIL ) 20 MG tablet Take 1 tablet (20 mg total) by mouth daily.   VITAMIN D , ERGOCALCIFEROL , PO Take 1 tablet by mouth daily.   Zinc  100 MG TABS Take by mouth.   [DISCONTINUED] methimazole  (TAPAZOLE ) 5 MG tablet Take 0.5 tablets (2.5 mg total) by mouth daily.   No facility-administered encounter medications on file as of 10/15/2023.   ALLERGIES: Allergies  Allergen Reactions   Losartan  Shortness Of Breath   Monosodium Glutamate     headaches   Amoxicillin Rash   Erythromycin Rash   Penicillins Rash    VACCINATION STATUS: Immunization History  Administered Date(s) Administered   Influenza,inj,Quad PF,6+ Mos 05/11/2018   Influenza-Unspecified 03/02/2016, 03/22/2017, 03/16/2019   PFIZER(Purple Top)SARS-COV-2 Vaccination 08/11/2019, 09/01/2019   PNEUMOCOCCAL CONJUGATE-20 01/28/2022   Pneumococcal Polysaccharide-23 04/18/2014   Tdap 04/07/2013    HPI Jonathan Tim. is 68 y.o. male who presents today with a medical  history as above. he is being seen in follow-up for hyperthyroidism.  He remains on the low-dose methimazole , 2.5 mg p.o. daily to which she is responding and tolerating very well.  His previsit labs are consistent with controlled thyroid  hormone profile.  His recent labs show mild elevation of bilirubin.    Prior to initiation with methimazole , he did have minimal uptake suggesting mild Graves' disease, but did not require ablative treatment.   He also has medical history of  hyperlipidemia on whole food plant-based diet with great results-see below.  He is not on statins for now, presents with significant improvement in his lipid panel . He denies palpitations, heat intolerance.  Denies dysphagia, shortness of breath, nor voice change. He underwent thyroid  ultrasound on March 02, 2022 which showed normal thyroid  ultrasound. Specifically, no evidence of thyromegaly,  atrophy or discrete thyroid  nodule or mass.    He has family history of hypothyroidism in his sister.  No family history of thyroid  malignancy. His other medical problems include hypertension-uncontrolled despite 2 medications at appropriate doses. He is status post brachytherapy with radioactive seed implant to treat prostate cancer.  He is on regular urology follow-up.  He is a non-smoker, nonalcohol user.  Review of Systems    Objective:       10/15/2023    9:36 AM 08/25/2023   10:06 AM 08/23/2023   10:40 AM  Vitals with BMI  Height 6\' 2"   6\' 2"   Weight 170 lbs 13 oz  169 lbs 10 oz  BMI 21.92  21.77  Systolic 120 131 409  Diastolic 82 88 73  Pulse 60 63 71    BP 120/82   Pulse 60   Ht 6\' 2"  (1.88 m)   Wt 170 lb 12.8 oz (77.5 kg)   BMI 21.93 kg/m   Wt Readings from Last 3 Encounters:  10/15/23 170 lb 12.8 oz (77.5 kg)  08/23/23 169 lb 9.6 oz (76.9 kg)  07/01/23 169 lb (76.7 kg)    Physical Exam    CMP ( most recent) CMP     Component Value Date/Time   NA 136 07/01/2023 1059   K 4.0 07/01/2023 1059    CL 94 (L) 07/01/2023 1059   CO2 26 07/01/2023 1059   GLUCOSE 73 07/01/2023 1059   BUN 8 07/01/2023 1059   CREATININE 0.75 (L) 07/01/2023 1059   CALCIUM 9.5 07/01/2023 1059   PROT 7.8 07/01/2023 1059   ALBUMIN 4.6 07/01/2023 1059   AST 22 07/01/2023 1059   ALT 20 07/01/2023 1059   ALKPHOS 100 07/01/2023 1059   BILITOT 1.4 (H) 07/01/2023 1059   GFRNONAA 93 06/24/2020 1555   GFRAA 108 06/24/2020 1555       Lipid Panel ( most recent) Lipid Panel     Component Value Date/Time   CHOL 172 10/12/2023 1203   CHOL 179 07/01/2023 1059   TRIG 37 10/12/2023 1203   HDL 85 10/12/2023 1203   HDL 89 07/01/2023 1059   CHOLHDL 2.0 10/12/2023 1203   VLDL 7 10/12/2023 1203   LDLCALC 80 10/12/2023 1203   LDLCALC 81 07/01/2023 1059   LABVLDL 9 07/01/2023 1059      Lab Results  Component Value Date   TSH 0.902 10/12/2023   TSH 1.520 08/17/2023   TSH 1.390 03/09/2023   TSH 1.430 10/27/2022   TSH 1.420 08/21/2022   TSH 0.170 (L) 07/30/2022   TSH <0.005 (L) 05/11/2022   TSH 0.179 (L) 01/28/2022   TSH 0.173 (L) 12/23/2020   TSH 0.021 (L) 06/24/2020   FREET4 0.91 10/12/2023   FREET4 1.35 08/17/2023   FREET4 1.38 03/09/2023   FREET4 1.26 10/27/2022   FREET4 1.17 08/21/2022   FREET4 1.17 07/30/2022   FREET4 1.78 (H) 05/11/2022   FREET4 1.40 12/23/2020    Reviewed ultrasound on March 02, 2022: Total right and left lobes measure 5.1 cm in greatest dimension. Normal thyroid  ultrasound. Specifically, no evidence of thyromegaly, atrophy or discrete thyroid  nodule or mass.  Thyroid  uptake and scan on May 05, 2022  FINDINGS: Homogeneous tracer distribution in both thyroid  lobes.   No focal areas of increased or decreased tracer localization.   4 hour I-123 uptake = 8.7% (normal 5-20%),  24 hour I-123 uptake = 21.9% (normal 10-30%)   IMPRESSION: Normal exam.   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 Hyperlipidemia 3. Hypertension  Jonathan Roberson  responded to the lowest dose of methimazole  at 2.5 mg with resolution of his primary hyperthyroidism.  In light of unexplained elevation in serum bilirubin, he is advised to discontinue methimazole  at this time.    He will have a repeat thyroid  function test and CMP before his next visit in 3 months.    Previous Thyroid  ultrasound is unremarkable.  -His previsit labs show significantly improved lipid panel on whole food plant-based diet with LDL stabilizing at 80, overall improving from 128.  This is without any statin intervention.  In light of the fact that he wishes to avoid statins, he is encouraged to continue on whole food plant-based diet with more fiber intake.     He is encouraged to stay close to his urologist for the care of his prostate cancer. He request that PSA will be added to his upcoming labs.  His blood pressure is better controlled with his current regimen, wishes to address that with PCP.    - he is advised to maintain close follow up with [name], Jolene T, NP for primary care needs.   I spent  22  minutes in the care of the patient today including review of labs from Thyroid  Function, CMP, and other relevant labs ; imaging/biopsy records (current and previous including abstractions from other facilities); face-to-face time discussing  his lab results and symptoms, medications doses, his options of short and long term treatment based on the latest standards of care / guidelines;   and documenting the encounter.  Jonathan Kil.  participated in the discussions, expressed understanding, and voiced agreement with the above plans.  All questions were answered to his satisfaction. he is encouraged to contact clinic should he have any questions or concerns prior to his return visit.    Follow up plan: Return in about 3 months (around 01/15/2024) for Fasting Labs  in AM B4 8.   Kalvin Orf, MD [facility] [address] [place], [place] [address] Phone: [phone]  Fax: [phone]     10/15/2023, 10:53 AM  This note was partially dictated with voice recognition software. Similar sounding words can be transcribed inadequately or may not  be corrected upon review.

## 2023-10-18 ENCOUNTER — Telehealth: Payer: Self-pay | Admitting: Nurse Practitioner

## 2023-10-18 NOTE — Telephone Encounter (Signed)
 Copied from CRM [id number]. Topic: Medicare AWV >> Oct 18, 2023  9:55 AM Juliana Ocean wrote: Reason for CRM: LVM 10/18/23 reminder call for AWV appt 10/19/23 @11 :Isidro Margo; Care Guide Ambulatory Clinical Support [place] [facility] Direct Dial: [phone]

## 2023-10-19 ENCOUNTER — Ambulatory Visit: Payer: Self-pay | Admitting: Emergency Medicine

## 2023-10-19 VITALS — Ht 72.0 in | Wt 170.0 lb

## 2023-10-19 DIAGNOSIS — Z0000 Encounter for general adult medical examination without abnormal findings: Secondary | ICD-10-CM | POA: Diagnosis not present

## 2023-10-19 DIAGNOSIS — J301 Allergic rhinitis due to pollen: Secondary | ICD-10-CM | POA: Diagnosis not present

## 2023-10-19 NOTE — Patient Instructions (Signed)
 Mr. Jonathan Roberson , Thank you for taking time out of your busy schedule to complete your Annual Wellness Visit with me. I enjoyed our conversation and look forward to speaking with you again next year. I, as well as your care team,  appreciate your ongoing commitment to your health goals. Please review the following plan we discussed and let me know if I can assist you in the future. Your Game plan/ To Do List    Referrals: None  Follow up Visits: Next Medicare AWV with our clinical staff: 10/24/24 @ 11:20am (phone visit)   Have you seen your provider in the last 6 months (3 months if uncontrolled diabetes)? Yes Next Office Visit with your provider: 12/31/23 @ 11:00am with Jolene [name], NP  Clinician Recommendations:  Aim for 30 minutes of exercise or brisk walking, 6-8 glasses of water , and 5 servings of fruits and vegetables each day. Recommend getting a tetanus shot/vaccine. Your insurance may pay for this at your local pharmacy.      This is a list of the screening recommended for you and due dates:  Health Maintenance  Topic Date Due   Zoster (Shingles) Vaccine (1 of 2) Never done   COVID-19 Vaccine (3 - Pfizer risk series) 09/29/2019   DTaP/Tdap/Td vaccine (2 - Td or Tdap) 06/30/2024*   Colon Cancer Screening  06/30/2024*   Flu Shot  12/31/2023   Medicare Annual Wellness Visit  10/18/2024   Pneumonia Vaccine  Completed   Hepatitis C Screening  Completed   HPV Vaccine  Aged Out   Meningitis B Vaccine  Aged Out  *Topic was postponed. The date shown is not the original due date.    Advanced directives: (ACP Link)Information on Advanced Care Planning can be found at [place]  Secretary of [facility] Advance Health Care Directives Advance Health Care Directives. [email or web address] You may also get these forms at your doctor's office. Advance Care Planning is important because it:  [x]  Makes sure you receive the medical care that is consistent with your values, goals, and preferences  [x]  It  provides guidance to your family and loved ones and reduces their decisional burden about whether or not they are making the right decisions based on your wishes.  Follow the link provided in your after visit summary or read over the paperwork we have mailed to you to help you started getting your Advance Directives in place. If you need assistance in completing these, please reach out to us  so that we can help you!  See attachments for Preventive Care and Fall Prevention Tips.   Fall Prevention in the Home, Adult Falls can cause injuries and affect people of all ages. There are many simple things that you can do to make your home safe and to help prevent falls. If you need it, ask for help making these changes. What actions can I take to prevent falls? General information Use good lighting in all rooms. Make sure to: Replace any light bulbs that burn out. Turn on lights if it is dark and use night-lights. Keep items that you use often in easy-to-reach places. Lower the shelves around your home if needed. Move furniture so that there are clear paths around it. Do not keep throw rugs or other things on the floor that can make you trip. If any of your floors are uneven, fix them. Add color or contrast paint or tape to clearly mark and help you see: Grab bars or handrails. First and last steps of staircases. Where the  edge of each step is. If you use a ladder or stepladder: Make sure that it is fully opened. Do not climb a closed ladder. Make sure the sides of the ladder are locked in place. Have someone hold the ladder while you use it. Know where your pets are as you move through your home. What can I do in the bathroom?     Keep the floor dry. Clean up any water  that is on the floor right away. Remove soap buildup in the bathtub or shower. Buildup makes bathtubs and showers slippery. Use non-skid mats or decals on the floor of the bathtub or shower. Attach bath mats securely with  double-sided, non-slip rug tape. If you need to sit down while you are in the shower, use a non-slip stool. Install grab bars by the toilet and in the bathtub and shower. Do not use towel bars as grab bars. What can I do in the bedroom? Make sure that you have a light by your bed that is easy to reach. Do not use any sheets or blankets on your bed that hang to the floor. Have a firm bench or chair with side arms that you can use for support when you get dressed. What can I do in the kitchen? Clean up any spills right away. If you need to reach something above you, use a sturdy step stool that has a grab bar. Keep electrical cables out of the way. Do not use floor polish or wax that makes floors slippery. What can I do with my stairs? Do not leave anything on the stairs. Make sure that you have a light switch at the top and the bottom of the stairs. Have them installed if you do not have them. Make sure that there are handrails on both sides of the stairs. Fix handrails that are broken or loose. Make sure that handrails are as long as the staircases. Install non-slip stair treads on all stairs in your home if they do not have carpet. Avoid having throw rugs at the top or bottom of stairs, or secure the rugs with carpet tape to prevent them from moving. Choose a carpet design that does not hide the edge of steps on the stairs. Make sure that carpet is firmly attached to the stairs. Fix any carpet that is loose or worn. What can I do on the outside of my home? Use bright outdoor lighting. Repair the edges of walkways and driveways and fix any cracks. Clear paths of anything that can make you trip, such as tools or rocks. Add color or contrast paint or tape to clearly mark and help you see high doorway thresholds. Trim any bushes or trees on the main path into your home. Check that handrails are securely fastened and in good repair. Both sides of all steps should have handrails. Install  guardrails along the edges of any raised decks or porches. Have leaves, snow, and ice cleared regularly. Use sand, salt, or ice melt on walkways during winter months if you live where there is ice and snow. In the garage, clean up any spills right away, including grease or oil spills. What other actions can I take? Review your medicines with your health care provider. Some medicines can make you confused or feel dizzy. This can increase your chance of falling. Wear closed-toe shoes that fit well and support your feet. Wear shoes that have rubber soles and low heels. Use a cane, walker, scooter, or crutches that help you move  around if needed. Talk with your provider about other ways that you can decrease your risk of falls. This may include seeing a physical therapist to learn to do exercises to improve movement and strength. Where to find more information Centers for Disease Control and Prevention, STEADI: TonerPromos.no [employer] on Aging: BaseRingTones.pl National Institute on Aging: BaseRingTones.pl Contact a health care provider if: You are afraid of falling at home. You feel weak, drowsy, or dizzy at home. You fall at home. Get help right away if you: Lose consciousness or have trouble moving after a fall. Have a fall that causes a head injury. These symptoms may be an emergency. Get help right away. Call 911. Do not wait to see if the symptoms will go away. Do not drive yourself to the hospital. This information is not intended to replace advice given to you by your health care provider. Make sure you discuss any questions you have with your health care provider. Document Revised: 01/19/2022 Document Reviewed: 01/19/2022 Elsevier Patient Education  2024 [employer].

## 2023-10-19 NOTE — Progress Notes (Signed)
 Subjective:   Jonathan Tieu. is a 68 y.o. who presents for a Medicare Wellness preventive visit.  As a reminder, Annual Wellness Visits don't include a physical exam, and some assessments may be limited, especially if this visit is performed virtually. We may recommend an in-person follow-up visit with your provider if needed.  Visit Complete: Virtual I connected with  Jonathan Kil. on 10/19/23 by a audio enabled telemedicine application and verified that I am speaking with the correct person using two identifiers.  Patient Location: Home  Provider Location: Home Office  I discussed the limitations of evaluation and management by telemedicine. The patient expressed understanding and agreed to proceed.  Vital Signs: Because this visit was a virtual/telehealth visit, some criteria may be missing or patient reported. Any vitals not documented were not able to be obtained and vitals that have been documented are patient reported.  VideoDeclined- This patient declined Librarian, academic. Therefore the visit was completed with audio only.  Persons Participating in Visit: Patient.  AWV Questionnaire: No: Patient Medicare AWV questionnaire was not completed prior to this visit.  Cardiac Risk Factors include: advanced age (>47men, >58 women);male gender;dyslipidemia;hypertension     Objective:     Today's Vitals   10/19/23 1122  Weight: 170 lb (77.1 kg)  Height: 6' (1.829 m)  PainSc: 1    Body mass index is 23.06 kg/m.     10/19/2023   11:33 AM 02/25/2023    9:44 AM 11/17/2022    9:02 AM 10/20/2022    6:27 AM 10/13/2022    2:35 PM 10/12/2022   11:15 AM 09/23/2022    8:20 AM  Advanced Directives  Does Patient Have a Medical Advance Directive? No No No No No No No  Would patient like information on creating a medical advance directive? Yes (MAU/Ambulatory/Procedural Areas - Information given) No - Patient declined No - Patient declined No -  Patient declined No - Patient declined  No - Patient declined    Current Medications (verified) Outpatient Encounter Medications as of 10/19/2023  Medication Sig   albuterol  (PROVENTIL ) (2.5 MG/3ML) 0.083% nebulizer solution USE 1 VIAL (3 ML) (2.5 MG TOTAL) VIA NEBULIZER EVERY 6 HOURS AS NEEDED FOR WHEEZING OR SHORTNESS OF BREATH   albuterol  (VENTOLIN  HFA) 108 (90 Base) MCG/ACT inhaler Inhale 2 puffs into the lungs every 6 (six) hours as needed for wheezing or shortness of breath.   amLODipine  (NORVASC ) 10 MG tablet Take 1 tablet (10 mg total) by mouth daily.   ascorbic acid (VITAMIN C) 500 MG tablet Take 500 mg by mouth daily.   Aspirin Buf,CaCarb-MgCarb-MgO, 81 MG TABS Take by mouth.   b complex vitamins capsule Take 1 capsule by mouth daily.   fluticasone  (FLONASE ) 50 MCG/ACT nasal spray PLACE 2 SPRAYS INTO BOTH NOSTRILS ONCE DAILY   fluticasone -salmeterol (WIXELA INHUB) 100-50 MCG/ACT AEPB INHALE 1 PUFF INTO THE LUNGS TWICE DAILY   hydrochlorothiazide  (HYDRODIURIL ) 12.5 MG tablet Take 1 tablet (12.5 mg total) by mouth daily.   Krill Oil (OMEGA-3) 500 MG CAPS Take 1 capsule by mouth daily at 2 PM.   lisinopril  (ZESTRIL ) 20 MG tablet Take 20 mg by mouth daily.   loratadine (CLARITIN) 10 MG tablet Take 10 mg by mouth daily as needed for allergies.   Misc Natural Products (GLUCOSAMINE CHOND CMP DOUBLE PO) Take 1,500 mg by mouth daily. Chondrotin 1200 mg   NON FORMULARY Allergy injections weekly   VITAMIN D , ERGOCALCIFEROL , PO Take 1 tablet  by mouth daily.   Zinc  100 MG TABS Take by mouth.   PARoxetine  (PAXIL ) 20 MG tablet Take 1 tablet (20 mg total) by mouth daily. (Patient not taking: Reported on 10/19/2023)   No facility-administered encounter medications on file as of 10/19/2023.    Allergies (verified) Losartan , Monosodium glutamate, Amoxicillin, Erythromycin, and Penicillins   History: Past Medical History:  Diagnosis Date   Allergy    Allergy to dust    Arthritis    hands,  hips, knees   Asthma    Cancer (HCC)    Depression    Hyperlipidemia    Hypertension    Hyperthyroidism    Past Surgical History:  Procedure Laterality Date   BIOPSY N/A 08/14/2016   Procedure: BIOPSY;  Surgeon: Marnee Sink, MD;  Location: [facility];  Service: Endoscopy;  Laterality: N/A;   COLONOSCOPY WITH PROPOFOL  N/A 08/14/2016   Procedure: COLONOSCOPY WITH PROPOFOL ;  Surgeon: Marnee Sink, MD;  Location: [facility];  Service: Endoscopy;  Laterality: N/A;   IMAGE GUIDED SINUS SURGERY Bilateral 09/24/2015   Procedure: IMAGE GUIDED SINUS SURGERY Bilateral nasal polypectomy,maxillary antrostomy with tissue removal,bilateral ethmoidectomy,frontal sinusotomy;  Surgeon: Von Grumbling, MD;  Location: [facility];  Service: ENT;  Laterality: Bilateral;  GAVE DISK TO CE CE 4-12 KP   NASAL SINUS SURGERY  2005   Dr. Meryl Acosta, [facility]   RADIOACTIVE SEED IMPLANT N/A 10/20/2022   Procedure: RADIOACTIVE SEED IMPLANT/BRACHYTHERAPY IMPLANT;  Surgeon: Geraline Knapp, MD;  Location: [facility];  Service: Urology;  Laterality: N/A;  67 seeds implanted   Family History  Problem Relation Age of Onset   Heart disease Mother    Depression Mother    Arthritis Sister    Emphysema Father    Depression Daughter    Social History   Socioeconomic History   Marital status: Married    Spouse name: Gurney Lefort   Number of children: 2   Years of education: Not on file   Highest education level: Not on file  Occupational History   Not on file  Tobacco Use   Smoking status: Never    Passive exposure: Past   Smokeless tobacco: Never  Vaping Use   Vaping status: Never Used  Substance and Sexual Activity   Alcohol use: Not Currently    Alcohol/week: 0.0 standard drinks of alcohol    Comment: on occasion- about once a year   Drug use: No   Sexual activity: Yes  Other Topics Concern   Not on file  Social History Narrative   Not on file   Social Drivers of Health   Financial  Resource Strain: Low Risk  (10/19/2023)   Overall Financial Resource Strain (CARDIA)    Difficulty of Paying Living Expenses: Not hard at all  Food Insecurity: No Food Insecurity (10/19/2023)   Hunger Vital Sign    Worried About Running Out of Food in the Last Year: Never true    Ran Out of Food in the Last Year: Never true  Transportation Needs: No Transportation Needs (10/19/2023)   PRAPARE - Administrator, Civil Service (Medical): No    Lack of Transportation (Non-Medical): No  Physical Activity: Sufficiently Active (10/19/2023)   Exercise Vital Sign    Days of Exercise per Week: 7 days    Minutes of Exercise per Session: 30 min  Stress: No Stress Concern Present (10/19/2023)   [employer] of Occupational Health - Occupational Stress Questionnaire    Feeling of Stress : Not  at all  Social Connections: Moderately Integrated (10/19/2023)   Social Connection and Isolation Panel [NHANES]    Frequency of Communication with Friends and Family: More than three times a week    Frequency of Social Gatherings with Friends and Family: More than three times a week    Attends Religious Services: More than 4 times per year    Active Member of [employer] or Organizations: No    Attends Engineer, structural: Never    Marital Status: Married    Tobacco Counseling Counseling given: Not Answered    Clinical Intake:  Pre-visit preparation completed: Yes  Pain : 0-10 Pain Score: 1  Pain Type: Chronic pain Pain Location: Back Pain Descriptors / Indicators: Aching     BMI - recorded: 23.06 Nutritional Status: BMI of 19-24  Normal Nutritional Risks: None Diabetes: No  No results found for: "HGBA1C"   How often do you need to have someone help you when you read instructions, pamphlets, or other written materials from your doctor or pharmacy?: 1 - Never  Interpreter Needed?: No  Information entered by :: Jaunita Messier, CMA   Activities of Daily Living      10/19/2023   11:24 AM 07/01/2023   10:33 AM  In your present state of health, do you have any difficulty performing the following activities:  Hearing? 0 0  Vision? 0 0  Difficulty concentrating or making decisions? 0 0  Walking or climbing stairs? 0 0  Dressing or bathing? 0 0  Doing errands, shopping? 0 0  Preparing Food and eating ? N   Using the Toilet? N   In the past six months, have you accidently leaked urine? Y   Comment prostate cancer   Do you have problems with loss of bowel control? N   Managing your Medications? N   Managing your Finances? N   Housekeeping or managing your Housekeeping? N     Patient Care Team: [name], Jolene T, NP as PCP - General (Nurse Practitioner) [facility], [facility] Od (Optometry) Nida, Jaynee Meyer, MD as Consulting Physician (Endocrinology) Geraline Knapp, MD (Urology) Glenis Langdon, MD as Consulting Physician (Radiation Oncology) Von Grumbling, MD as Referring Physician (Otolaryngology)  Indicate any recent Medical Services you may have received from other than [facility] providers in the past year (date may be approximate).     Assessment:    This is a routine wellness examination for Jonathan Roberson.  Hearing/Vision screen Hearing Screening - Comments:: Denies hearing loss Vision Screening - Comments:: Gets routine eye exams, [facility] [place]   Goals Addressed             This Visit's Progress    Patient Stated       Maintain current health and activity       Depression Screen     10/19/2023   11:31 AM 08/23/2023   10:47 AM 07/01/2023   10:27 AM 03/30/2023    3:13 PM 01/28/2023   10:28 AM 10/13/2022    2:33 PM 07/30/2022   10:50 AM  PHQ 2/9 Scores  PHQ - 2 Score 0 1 2 0 2 1 2   PHQ- 9 Score 0 4 4 3 3 3 2     Fall Risk     10/19/2023   11:35 AM 08/23/2023   10:45 AM 07/01/2023   10:27 AM 03/30/2023    3:13 PM 10/13/2022    2:36 PM  Fall Risk   Falls in the past year? 0 0 0  0 0  Number falls in past yr: 0 0 0  0 0  Injury with Fall? 0 0 0 0 0  Risk for fall due to : No Fall Risks No Fall Risks No Fall Risks No Fall Risks No Fall Risks  Follow up Falls evaluation completed Falls evaluation completed Falls evaluation completed Falls evaluation completed Falls prevention discussed;Falls evaluation completed    MEDICARE RISK AT HOME:  Medicare Risk at Home Any stairs in or around the home?: No If so, are there any without handrails?: No Home free of loose throw rugs in walkways, pet beds, electrical cords, etc?: Yes Adequate lighting in your home to reduce risk of falls?: Yes Life alert?: No Use of a cane, walker or w/c?: No Grab bars in the bathroom?: No Shower chair or bench in shower?: Yes Elevated toilet seat or a handicapped toilet?: Yes  TIMED UP AND GO:  Was the test performed?  No  Cognitive Function: 6CIT completed        10/19/2023   11:36 AM 10/13/2022    2:39 PM  6CIT Screen  What Year? 0 points 0 points  What month? 0 points 0 points  What time? 3 points 0 points  Count back from 20 0 points 2 points  Months in reverse 0 points 0 points  Repeat phrase 2 points 0 points  Total Score 5 points 2 points    Immunizations Immunization History  Administered Date(s) Administered   Influenza,inj,Quad PF,6+ Mos 05/11/2018   Influenza-Unspecified 03/02/2016, 03/22/2017, 03/16/2019   PFIZER(Purple Top)SARS-COV-2 Vaccination 08/11/2019, 09/01/2019   PNEUMOCOCCAL CONJUGATE-20 01/28/2022   Pneumococcal Polysaccharide-23 04/18/2014   Tdap 04/07/2013    Screening Tests Health Maintenance  Topic Date Due   Zoster Vaccines- Shingrix  (1 of 2) Never done   COVID-19 Vaccine (3 - Pfizer risk series) 09/29/2019   DTaP/Tdap/Td (2 - Td or Tdap) 06/30/2024 (Originally 04/08/2023)   Colonoscopy  06/30/2024 (Originally 08/14/2021)   INFLUENZA VACCINE  12/31/2023   Medicare Annual Wellness (AWV)  10/18/2024   Pneumonia Vaccine 72+ Years old  Completed   Hepatitis C Screening  Completed    HPV VACCINES  Aged Out   Meningococcal B Vaccine  Aged Out    Health Maintenance  Health Maintenance Due  Topic Date Due   Zoster Vaccines- Shingrix  (1 of 2) Never done   COVID-19 Vaccine (3 - Pfizer risk series) 09/29/2019   Health Maintenance Items Addressed: See Nurse Notes  Additional Screening:  Vision Screening: Recommended annual ophthalmology exams for early detection of glaucoma and other disorders of the eye.  Dental Screening: Recommended annual dental exams for proper oral hygiene  Community Resource Referral / Chronic Care Management: CRR required this visit?  No   CCM required this visit?  No   Plan:    I have personally reviewed and noted the following in the patient's chart:   Medical and social history Use of alcohol, tobacco or illicit drugs  Current medications and supplements including opioid prescriptions. Patient is not currently taking opioid prescriptions. Functional ability and status Nutritional status Physical activity Advanced directives List of other physicians Hospitalizations, surgeries, and ER visits in previous 12 months Vitals Screenings to include cognitive, depression, and falls Referrals and appointments  In addition, I have reviewed and discussed with patient certain preventive protocols, quality metrics, and best practice recommendations. A written personalized care plan for preventive services as well as general preventive health recommendations were provided to patient.   Jaunita Messier, CMA  10/19/2023   After Visit Summary: (MyChart) Due to this being a telephonic visit, the after visit summary with patients personalized plan was offered to patient via MyChart   Notes: Please refer to Routing Comments.

## 2023-10-20 ENCOUNTER — Other Ambulatory Visit: Payer: Self-pay

## 2023-10-22 ENCOUNTER — Encounter: Payer: Self-pay | Admitting: Cardiology

## 2023-10-22 ENCOUNTER — Ambulatory Visit: Attending: Cardiology | Admitting: Cardiology

## 2023-10-22 VITALS — BP 140/92 | HR 57 | Ht 73.0 in | Wt 167.6 lb

## 2023-10-22 DIAGNOSIS — I10 Essential (primary) hypertension: Secondary | ICD-10-CM | POA: Diagnosis not present

## 2023-10-22 DIAGNOSIS — E785 Hyperlipidemia, unspecified: Secondary | ICD-10-CM

## 2023-10-22 DIAGNOSIS — I2510 Atherosclerotic heart disease of native coronary artery without angina pectoris: Secondary | ICD-10-CM

## 2023-10-22 MED ORDER — ASPIRIN 81 MG PO TBEC: 81.0000 mg | DELAYED_RELEASE_TABLET | Freq: Every day | ORAL | Status: AC

## 2023-10-22 MED ORDER — ATORVASTATIN CALCIUM 40 MG PO TABS: 40.0000 mg | ORAL_TABLET | Freq: Every day | ORAL | 3 refills | Status: DC

## 2023-10-22 NOTE — Progress Notes (Signed)
 Cardiology Office Note:    Date:  10/22/2023   ID:  Jonathan Kil., DOB 1955/07/28, MRN [id number]  PCP:  Lemar Pyles, NP   [facility] Providers Cardiologist:  None     Referring MD: Lemar Pyles, NP   Chief Complaint  Patient presents with   Hypertension    Referred for essential hypertension.Patient had issues with Lisinopril , but medication has been adjusted. Patient states that he had an MRI and arthrosclerosis was noted on his MRI. Patient is doing well on today. Meds reviewed.     History of Present Illness:    Jonathan Roberson. is a 68 y.o. male with a hx of CAD (LAD, RCA, LCx calcifications on chest CT )hypertension, prostate cancer s/p resection, RT 2024 who presents due to coronary and aortic atherosclerosis.  Patient had a NM PET scan 04/2022, three-vessel coronary calcifications and aortic atherosclerosis was noted.  He denies chest pain or shortness of breath.  States being very active by walking, going up hills, has no symptoms.  Denies smoking, denies any family history of heart disease.  Blood pressures are adequately controlled at home with systolic in the 120s.  Past Medical History:  Diagnosis Date   Allergy    Allergy to dust    Arthritis    hands, hips, knees   Asthma    Cancer (HCC)    Depression    Hyperlipidemia    Hypertension    Hyperthyroidism     Past Surgical History:  Procedure Laterality Date   BIOPSY N/A 08/14/2016   Procedure: BIOPSY;  Surgeon: Marnee Sink, MD;  Location: [facility];  Service: Endoscopy;  Laterality: N/A;   COLONOSCOPY WITH PROPOFOL  N/A 08/14/2016   Procedure: COLONOSCOPY WITH PROPOFOL ;  Surgeon: Marnee Sink, MD;  Location: [facility];  Service: Endoscopy;  Laterality: N/A;   IMAGE GUIDED SINUS SURGERY Bilateral 09/24/2015   Procedure: IMAGE GUIDED SINUS SURGERY Bilateral nasal polypectomy,maxillary antrostomy with tissue removal,bilateral ethmoidectomy,frontal sinusotomy;   Surgeon: Von Grumbling, MD;  Location: [facility];  Service: ENT;  Laterality: Bilateral;  GAVE DISK TO CE CE 4-12 KP   NASAL SINUS SURGERY  2005   Dr[facility] Meryl Ac[facility]   RADIOACTIVE SEED IMPLANT N/A 10/20/2022   Procedure: RADIOACTIVE SEED IMPLANT/BRACHYTHERAPY IMPLANT;  Surgeon: Geraline Knapp, MD;  Location: [facility];  Service: Urology;  Laterality: N/A;  67 seeds implanted    Current Medications: Current Meds  Medication Sig   albuterol  (PROVENTIL ) (2.5 MG/3ML) 0.083% nebulizer solution USE 1 VIAL (3 ML) (2.5 MG TOTAL) VIA NEBULIZER EVERY 6 HOURS AS NEEDED FOR WHEEZING OR SHORTNESS OF BREATH   albuterol  (VENTOLIN  HFA) 108 (90 Base) MCG/ACT inhaler Inhale 2 puffs into the lungs every 6 (six) hours as needed for wheezing or shortness of breath.   amLODipine  (NORVASC ) 10 MG tablet Take 1 tablet (10 mg total) by mouth daily.   ascorbic acid (VITAMIN C) 500 MG tablet Take 500 mg by mouth daily.   Aspirin Buf,CaCarb-MgCarb-MgO, 81 MG TABS Take by mouth.   aspirin EC 81 MG tablet Take 1 tablet (81 mg total) by mouth daily. Swallow whole.   atorvastatin (LIPITOR) 40 MG tablet Take 1 tablet (40 mg total) by mouth daily.   b complex vitamins capsule Take 1 capsule by mouth daily.   fluticasone  (FLONASE ) 50 MCG/ACT nasal spray PLACE 2 SPRAYS INTO BOTH NOSTRILS ONCE DAILY   fluticasone -salmeterol (WIXELA INHUB) 100-50 MCG/ACT AEPB INHALE 1 PUFF INTO THE LUNGS  TWICE DAILY   hydrochlorothiazide  (HYDRODIURIL ) 12.5 MG tablet Take 1 tablet (12.5 mg total) by mouth daily.   Krill Oil (OMEGA-3) 500 MG CAPS Take 1 capsule by mouth daily at 2 PM.   lisinopril  (ZESTRIL ) 20 MG tablet Take 20 mg by mouth daily.   loratadine (CLARITIN) 10 MG tablet Take 10 mg by mouth daily as needed for allergies.   Misc Natural Products (GLUCOSAMINE CHOND CMP DOUBLE PO) Take 1,500 mg by mouth daily. Chondrotin 1200 mg   NON FORMULARY Allergy injections weekly   PARoxetine  (PAXIL ) 20 MG tablet Take 1 tablet (20 mg  total) by mouth daily.   VITAMIN D , ERGOCALCIFEROL , PO Take 1 tablet by mouth daily.   Zinc  100 MG TABS Take by mouth.     Allergies:   Losartan , Monosodium glutamate, Amoxicillin, Erythromycin, and Penicillins   Social History   Socioeconomic History   Marital status: Married    Spouse name: Gurney Lefort   Number of children: 2   Years of education: Not on file   Highest education level: Not on file  Occupational History   Not on file  Tobacco Use   Smoking status: Never    Passive exposure: Past   Smokeless tobacco: Never  Vaping Use   Vaping status: Never Used  Substance and Sexual Activity   Alcohol use: Not Currently    Alcohol/week: 0.0 standard drinks of alcohol    Comment: on occasion- about once a year   Drug use: No   Sexual activity: Yes  Other Topics Concern   Not on file  Social History Narrative   Not on file   Social Drivers of Health   Financial Resource Strain: Low Risk  (10/19/2023)   Overall Financial Resource Strain (CARDIA)    Difficulty of Paying Living Expenses: Not hard at all  Food Insecurity: No Food Insecurity (10/19/2023)   Hunger Vital Sign    Worried About Running Out of Food in the Last Year: Never true    Ran Out of Food in the Last Year: Never true  Transportation Needs: No Transportation Needs (10/19/2023)   PRAPARE - Administrator, Civil Service (Medical): No    Lack of Transportation (Non-Medical): No  Physical Activity: Sufficiently Active (10/19/2023)   Exercise Vital Sign    Days of Exercise per Week: 7 days    Minutes of Exercise per Session: 30 min  Stress: No Stress Concern Present (10/19/2023)   [employer] of Occupational Health - Occupational Stress Questionnaire    Feeling of Stress : Not at all  Social Connections: Moderately Integrated (10/19/2023)   Social Connection and Isolation Panel [NHANES]    Frequency of Communication with Friends and Family: More than three times a week    Frequency of Social  Gatherings with Friends and Family: More than three times a week    Attends Religious Services: More than 4 times per year    Active Member of [employer] or Organizations: No    Attends Engineer, structural: Never    Marital Status: Married     Family History: The patient's family history includes Arthritis in his sister; Depression in his daughter and mother; Emphysema in his father; Heart disease in his mother.  ROS:   Please see the history of present illness.     All other systems reviewed and are negative.  EKGs/Labs/Other Studies Reviewed:    The following studies were reviewed today:  EKG Interpretation Date/Time:  Friday Oct 22 2023 11:00:15 EDT  Ventricular Rate:  57 PR Interval:  172 QRS Duration:  96 QT Interval:  396 QTC Calculation: 385 R Axis:   -39  Text Interpretation: Sinus bradycardia Left axis deviation Confirmed by Constancia Delton ([id number]) on 10/22/2023 11:05:38 AM    Recent Labs: 07/01/2023: ALT 20; BUN 8; Creatinine, Ser 0.75; Potassium 4.0; Sodium 136 08/17/2023: Hemoglobin 14.0; Platelets 192 10/12/2023: TSH 0.902  Recent Lipid Panel    Component Value Date/Time   CHOL 172 10/12/2023 1203   CHOL 179 07/01/2023 1059   TRIG 37 10/12/2023 1203   HDL 85 10/12/2023 1203   HDL 89 07/01/2023 1059   CHOLHDL 2.0 10/12/2023 1203   VLDL 7 10/12/2023 1203   LDLCALC 80 10/12/2023 1203   LDLCALC 81 07/01/2023 1059     Risk Assessment/Calculations:         Physical Exam:    VS:  BP (!) 140/92   Pulse (!) 57   Ht 6\' 1"  (1.854 m)   Wt 167 lb 9.6 oz (76 kg)   SpO2 95%   BMI 22.11 kg/m     Wt Readings from Last 3 Encounters:  10/22/23 167 lb 9.6 oz (76 kg)  10/19/23 170 lb (77.1 kg)  10/15/23 170 lb 12.8 oz (77.5 kg)     GEN:  Well nourished, well developed in no acute distress HEENT: Normal NECK: No JVD; No carotid bruits CARDIAC: RRR, no murmurs, rubs, gallops RESPIRATORY:  Clear to auscultation without rales, wheezing or rhonchi   ABDOMEN: Soft, non-tender, non-distended MUSCULOSKELETAL:  No edema; No deformity  SKIN: Warm and dry NEUROLOGIC:  Alert and oriented x 3 PSYCHIATRIC:  Normal affect   ASSESSMENT:    1. Coronary artery disease involving native heart, unspecified vessel or lesion type, unspecified whether angina present   2. Primary hypertension   3. Dyslipidemia, goal LDL below 70    PLAN:    In order of problems listed above:  CAD, LAD, LCx, RCA calcifications on chest CT 04/2022.  Denies chest pain.  Start aspirin  81 mg daily, Lipitor 40 mg daily.  Obtain echocardiogram.  Patient otherwise asymptomatic, consider left heart cath if patient becomes symptomatic due to significant three-vessel calcifications. Hypertension, BP elevated today, well-controlled at home.  Continue lisinopril  20 mg daily, Norvasc  10 mg daily, HCTZ 12.5 mg daily. Goal LDL less than 70, will push for less than 50 if possible.  Start Lipitor 40 mg daily.  Obtain lipid panel in 3 months.  Follow-up in 3 months after repeat echo     Medication Adjustments/Labs and Tests Ordered: Current medicines are reviewed at length with the patient today.  Concerns regarding medicines are outlined above.  Orders Placed This Encounter  Procedures   Lipid panel   EKG 12-Lead   ECHOCARDIOGRAM COMPLETE   Meds ordered this encounter  Medications   aspirin  EC 81 MG tablet    Sig: Take 1 tablet (81 mg total) by mouth daily. Swallow whole.   atorvastatin  (LIPITOR) 40 MG tablet    Sig: Take 1 tablet (40 mg total) by mouth daily.    Dispense:  90 tablet    Refill:  3    Patient Instructions  Medication Instructions:  Your physician has recommended you make the following change in your medication:  Start  Lipitor 40 mg by mouth daily,  Aspirin  81 mg by mouth daily *If you need a refill on your cardiac medications before your next appointment, please call your pharmacy*  Lab Work: Your provider would like for you  to return in 3 months  to have the following labs drawn: Lipid Panel.   Please go to [facility] [address] ([facility]) [address], [place] [address] You do not need an appointment.  They are open from 8 am- 4:30 pm.  Lunch from 1:00 pm- 2:00 pm You will need to be fasting.    Testing/Procedures: Your physician has requested that you have an echocardiogram. Echocardiography is a painless test that uses sound waves to create images of your heart. It provides your doctor with information about the size and shape of your heart and how well your heart's chambers and valves are working.   You may receive an ultrasound enhancing agent through an IV if needed to better visualize your heart during the echo. This procedure takes approximately one hour.  There are no restrictions for this procedure.  This will take place at [address] [facility]) [address], [place] [address]  Please note: We ask at that you not bring children with you during ultrasound (echo/ vascular) testing. Due to room size and safety concerns, children are not allowed in the ultrasound rooms during exams. Our front office staff cannot provide observation of children in our lobby area while testing is being conducted. An adult accompanying a patient to their appointment will only be allowed in the ultrasound room at the discretion of the ultrasound technician under special circumstances. We apologize for any inconvenience.   Follow-Up: At [facility], you and your health needs are our priority.  As part of our continuing mission to provide you with exceptional heart care, our providers are all part of one team.  This team includes your primary Cardiologist (physician) and Advanced Practice Providers or APPs (Physician Assistants and Nurse Practitioners) who all work together to provide you with the care you need, when you need it.  Your next appointment:   3 month(s)  Provider:   You may see Dr.  Junnie Olives or one of the following Advanced Practice Providers on your designated Care Team:   Laneta Pintos, NP Gildardo Labrador, PA-C Varney Gentleman, PA-C Cadence Gennaro Khat, PA-C Ronald Cockayne, NP Morey Ar, NP          Signed, Constancia Delton, MD  10/22/2023 11:46 AM    [facility]

## 2023-10-22 NOTE — Patient Instructions (Signed)
 Medication Instructions:  Your physician has recommended you make the following change in your medication:  Start  Lipitor 40 mg by mouth daily,  Aspirin 81 mg by mouth daily *If you need a refill on your cardiac medications before your next appointment, please call your pharmacy*  Lab Work: Your provider would like for you to return in 3 months to have the following labs drawn: Lipid Panel.   Please go to [facility] [address] ([facility]) [address], [place] [address] You do not need an appointment.  They are open from 8 am- 4:30 pm.  Lunch from 1:00 pm- 2:00 pm You will need to be fasting.    Testing/Procedures: Your physician has requested that you have an echocardiogram. Echocardiography is a painless test that uses sound waves to create images of your heart. It provides your doctor with information about the size and shape of your heart and how well your heart's chambers and valves are working.   You may receive an ultrasound enhancing agent through an IV if needed to better visualize your heart during the echo. This procedure takes approximately one hour.  There are no restrictions for this procedure.  This will take place at [address] [facility]) [address], [place] [address]  Please note: We ask at that you not bring children with you during ultrasound (echo/ vascular) testing. Due to room size and safety concerns, children are not allowed in the ultrasound rooms during exams. Our front office staff cannot provide observation of children in our lobby area while testing is being conducted. An adult accompanying a patient to their appointment will only be allowed in the ultrasound room at the discretion of the ultrasound technician under special circumstances. We apologize for any inconvenience.   Follow-Up: At [facility], you and your health needs are our priority.  As part of our continuing mission to provide you with  exceptional heart care, our providers are all part of one team.  This team includes your primary Cardiologist (physician) and Advanced Practice Providers or APPs (Physician Assistants and Nurse Practitioners) who all work together to provide you with the care you need, when you need it.  Your next appointment:   3 month(s)  Provider:   You may see Dr. Junnie Olives or one of the following Advanced Practice Providers on your designated Care Team:   Laneta Pintos, NP Gildardo Labrador, PA-C Varney Gentleman, PA-C Cadence Ashburn, PA-C Ronald Cockayne, NP Morey Ar, NP

## 2023-10-26 DIAGNOSIS — J301 Allergic rhinitis due to pollen: Secondary | ICD-10-CM | POA: Diagnosis not present

## 2023-11-02 DIAGNOSIS — J301 Allergic rhinitis due to pollen: Secondary | ICD-10-CM | POA: Diagnosis not present

## 2023-11-05 ENCOUNTER — Ambulatory Visit

## 2023-11-09 DIAGNOSIS — J301 Allergic rhinitis due to pollen: Secondary | ICD-10-CM | POA: Diagnosis not present

## 2023-11-16 DIAGNOSIS — I2510 Atherosclerotic heart disease of native coronary artery without angina pectoris: Secondary | ICD-10-CM | POA: Diagnosis not present

## 2023-11-16 DIAGNOSIS — J301 Allergic rhinitis due to pollen: Secondary | ICD-10-CM | POA: Diagnosis not present

## 2023-11-17 ENCOUNTER — Telehealth: Payer: Self-pay | Admitting: "Endocrinology

## 2023-11-17 ENCOUNTER — Other Ambulatory Visit: Payer: Self-pay | Admitting: "Endocrinology

## 2023-11-17 DIAGNOSIS — E0590 Thyrotoxicosis, unspecified without thyrotoxic crisis or storm: Secondary | ICD-10-CM

## 2023-11-17 LAB — LIPID PANEL
Chol/HDL Ratio: 2 ratio (ref 0.0–5.0)
Chol/HDL Ratio: 1.9 ratio (ref 0.0–5.0)
Cholesterol, Total: 180 mg/dL (ref 100–199)
Cholesterol, Total: 177 mg/dL (ref 100–199)
HDL: 91 mg/dL (ref >39)
HDL: 92 mg/dL (ref >39)
LDL Chol Calc (NIH): 79 mg/dL (ref 0–99)
LDL Chol Calc (NIH): 74 mg/dL (ref 0–99)
Triglycerides: 52 mg/dL (ref 0–149)
Triglycerides: 53 mg/dL (ref 0–149)
VLDL Cholesterol Cal: 10 mg/dL (ref 5–40)
VLDL Cholesterol Cal: 11 mg/dL (ref 5–40)

## 2023-11-17 LAB — COMPREHENSIVE METABOLIC PANEL WITH GFR
ALT: 14 IU/L (ref 0–44)
AST: 19 IU/L (ref 0–40)
Albumin: 4.7 g/dL (ref 3.9–4.9)
Alkaline Phosphatase: 94 IU/L (ref 44–121)
BUN/Creatinine Ratio: 8 — ABNORMAL LOW (ref 10–24)
BUN: 6 mg/dL — ABNORMAL LOW (ref 8–27)
Bilirubin Total: 1.3 mg/dL — ABNORMAL HIGH (ref 0.0–1.2)
CO2: 21 mmol/L (ref 20–29)
Calcium: 9.7 mg/dL (ref 8.6–10.2)
Chloride: 93 mmol/L — ABNORMAL LOW (ref 96–106)
Creatinine, Ser: 0.8 mg/dL (ref 0.76–1.27)
Globulin, Total: 3 g/dL (ref 1.5–4.5)
Glucose: 77 mg/dL (ref 70–99)
Potassium: 4 mmol/L (ref 3.5–5.2)
Sodium: 134 mmol/L (ref 134–144)
Total Protein: 7.7 g/dL (ref 6.0–8.5)
eGFR: 97 mL/min/{1.73_m2} (ref >59)

## 2023-11-17 LAB — T3, FREE: T3, Free: 3.2 pg/mL (ref 2.0–4.4)

## 2023-11-17 LAB — T4, FREE: Free T4: 1.42 ng/dL (ref 0.82–1.77)

## 2023-11-17 LAB — TSH: TSH: 1 u[IU]/mL (ref 0.450–4.500)

## 2023-11-17 LAB — PSA: Prostate Specific Ag, Serum: 1.7 ng/mL (ref 0.0–4.0)

## 2023-11-17 NOTE — Telephone Encounter (Signed)
 Pt did his labs too early. Can you add orders back?

## 2023-11-17 NOTE — Telephone Encounter (Signed)
 Mailed lab orders and sent pt my chart message to let him know when to do labs.

## 2023-11-18 ENCOUNTER — Encounter: Payer: Self-pay | Admitting: Nurse Practitioner

## 2023-11-23 DIAGNOSIS — J301 Allergic rhinitis due to pollen: Secondary | ICD-10-CM | POA: Diagnosis not present

## 2023-11-24 ENCOUNTER — Ambulatory Visit: Payer: Self-pay | Admitting: Urology

## 2023-11-25 ENCOUNTER — Ambulatory Visit: Payer: Medicare HMO | Admitting: Urology

## 2023-11-28 ENCOUNTER — Ambulatory Visit: Payer: Self-pay | Admitting: Cardiology

## 2023-11-29 ENCOUNTER — Other Ambulatory Visit: Payer: Self-pay

## 2023-11-29 MED ORDER — ATORVASTATIN CALCIUM 80 MG PO TABS: 40.0000 mg | ORAL_TABLET | Freq: Every day | ORAL | 3 refills | Status: DC

## 2023-11-30 DIAGNOSIS — J301 Allergic rhinitis due to pollen: Secondary | ICD-10-CM | POA: Diagnosis not present

## 2023-12-06 ENCOUNTER — Other Ambulatory Visit: Payer: Self-pay | Admitting: Nurse Practitioner

## 2023-12-07 DIAGNOSIS — J301 Allergic rhinitis due to pollen: Secondary | ICD-10-CM | POA: Diagnosis not present

## 2023-12-08 NOTE — Telephone Encounter (Signed)
 Requested Prescriptions  Pending Prescriptions Disp Refills   hydrochlorothiazide  (HYDRODIURIL ) 12.5 MG tablet [Pharmacy Med Name: HYDROCHLOROTHIAZIDE  12.5 MG TAB] 30 tablet 4    Sig: TAKE 1 TABLET BY MOUTH ONCE DAILY     Cardiovascular: Diuretics - Thiazide Failed - 12/08/2023  4:09 PM      Failed - Last BP in normal range    BP Readings from Last 1 Encounters:  10/22/23 (!) 140/92         Passed - Cr in normal range and within 180 days    Creatinine, Ser  Date Value Ref Range Status  11/16/2023 0.80 0.76 - 1.27 mg/dL Final         Passed - K in normal range and within 180 days    Potassium  Date Value Ref Range Status  11/16/2023 4.0 3.5 - 5.2 mmol/L Final         Passed - Na in normal range and within 180 days    Sodium  Date Value Ref Range Status  11/16/2023 134 134 - 144 mmol/L Final         Passed - Valid encounter within last 6 months    Recent Outpatient Visits           3 months ago Essential hypertension, benign   [facility] [place], Melanie DASEN, NP       Future Appointments             In 2 weeks Stoioff, Glendia BROCKS, MD [facility] [place]   In 3 weeks LaPlace, Melanie DASEN, NP [facility]   In 1 month Agbor-Etang, Redell, MD [facility]

## 2023-12-13 ENCOUNTER — Telehealth: Payer: Self-pay | Admitting: Cardiology

## 2023-12-13 NOTE — Telephone Encounter (Signed)
 Pt called in asking to speak with nurse. He has some questions about how echo is done.

## 2023-12-13 NOTE — Telephone Encounter (Signed)
 Patient just wanted to know how an echocardiogram is done. Reviewed in detail how it is performed and he verbalized understanding with no further questions at this time.

## 2023-12-14 DIAGNOSIS — J301 Allergic rhinitis due to pollen: Secondary | ICD-10-CM | POA: Diagnosis not present

## 2023-12-21 DIAGNOSIS — J301 Allergic rhinitis due to pollen: Secondary | ICD-10-CM | POA: Diagnosis not present

## 2023-12-24 ENCOUNTER — Ambulatory Visit: Admitting: Urology

## 2023-12-24 ENCOUNTER — Telehealth: Payer: Self-pay | Admitting: Urology

## 2023-12-24 MED ORDER — TADALAFIL 5 MG PO TABS: 5.0000 mg | ORAL_TABLET | Freq: Every day | ORAL | 5 refills | Status: DC

## 2023-12-24 NOTE — Telephone Encounter (Signed)
 Was scheduled for follow-up appointment this morning and he had mixed up his appointment time.  I was unable to see him this morning but indicated I would call him today since his PSA was stable.  Last PSA 11/16/2023 was stable at 1.7.  He has had some urinary frequency and urgency which has improved though does have intermittent pain at the head of the penis and pain after ejaculation.  No gross hematuria.  He is having successful intercourse but was interested in a trial of tadalafil that was suggested by his son who is a Engineer, civil (consulting).  He has a follow-up appointment scheduled with Dr. Lenn October 2025.  We discussed his pelvic symptoms may be secondary to prostatic inflammation and low-dose tadalafil may actually help the symptoms Rx tadalafil 5 mg 1 tab daily was sent to his pharmacy Radiation oncology follow-up scheduled 03/2024 and will see him April 2025 with PSA Instructed call earlier for worsening voiding symptoms or pain

## 2023-12-27 ENCOUNTER — Encounter: Payer: Self-pay | Admitting: Urology

## 2023-12-27 NOTE — Patient Instructions (Incomplete)
 Be Involved in Caring For Your Health:  Taking Medications When medications are taken as directed, they can greatly improve your health. But if they are not taken as prescribed, they may not work. In some cases, not taking them correctly can be harmful. To help ensure your treatment remains effective and safe, understand your medications and how to take them. Bring your medications to each visit for review by your provider.  Your lab results, notes, and after visit summary will be available on My Chart. We strongly encourage you to use this feature. If lab results are abnormal the clinic will contact you with the appropriate steps. If the clinic does not contact you assume the results are satisfactory. You can always view your results on My Chart. If you have questions regarding your health or results, please contact the clinic during office hours. You can also ask questions on My Chart.  We at [facility] are grateful that you chose [place] to provide your care. We strive to provide evidence-based and compassionate care and are always looking for feedback. If you get a survey from the clinic please complete this so we can hear your opinions.  Heart-Healthy Eating Plan Many factors influence your heart health, including eating and exercise habits. Heart health is also called coronary health. Coronary risk increases with abnormal blood fat (lipid) levels. A heart-healthy eating plan includes limiting unhealthy fats, increasing healthy fats, limiting salt (sodium) intake, and making other diet and lifestyle changes. What is my plan? Your health care provider may recommend that: You limit your fat intake to _________% or less of your total calories each day. You limit your saturated fat intake to _________% or less of your total calories each day. You limit the amount of cholesterol in your diet to less than _________ mg per day. You limit the amount of sodium in your diet to less than _________  mg per day. What are tips for following this plan? Cooking Cook foods using methods other than frying. Baking, boiling, grilling, and broiling are all good options. Other ways to reduce fat include: Removing the skin from poultry. Removing all visible fats from meats. Steaming vegetables in water or broth. Meal planning  At meals, imagine dividing your plate into fourths: Fill one-half of your plate with vegetables and green salads. Fill one-fourth of your plate with whole grains. Fill one-fourth of your plate with lean protein foods. Eat 2-4 cups of vegetables per day. One cup of vegetables equals 1 cup (91 g) broccoli or cauliflower florets, 2 medium carrots, 1 large bell pepper, 1 large sweet potato, 1 large tomato, 1 medium white potato, 2 cups (150 g) raw leafy greens. Eat 1-2 cups of fruit per day. One cup of fruit equals 1 small apple, 1 large banana, 1 cup (237 g) mixed fruit, 1 large orange,  cup (82 g) dried fruit, 1 cup (240 mL) 100% fruit juice. Eat more foods that contain soluble fiber. Examples include apples, broccoli, carrots, beans, peas, and barley. Aim to get 25-30 g of fiber per day. Increase your consumption of legumes, nuts, and seeds to 4-5 servings per week. One serving of dried beans or legumes equals  cup (90 g) cooked, 1 serving of nuts is  oz (12 almonds, 24 pistachios, or 7 walnut halves), and 1 serving of seeds equals  oz (8 g). Fats Choose healthy fats more often. Choose monounsaturated and polyunsaturated fats, such as olive and canola oils, avocado oil, flaxseeds, walnuts, almonds, and seeds. Eat  more omega-3 fats. Choose salmon, mackerel, sardines, tuna, flaxseed oil, and ground flaxseeds. Aim to eat fish at least 2 times each week. Check food labels carefully to identify foods with trans fats or high amounts of saturated fat. Limit saturated fats. These are found in animal products, such as meats, butter, and cream. Plant sources of saturated fats  include palm oil, palm kernel oil, and coconut oil. Avoid foods with partially hydrogenated oils in them. These contain trans fats. Examples are stick margarine, some tub margarines, cookies, crackers, and other baked goods. Avoid fried foods. General information Eat more home-cooked food and less restaurant, buffet, and fast food. Limit or avoid alcohol. Limit foods that are high in added sugar and simple starches such as foods made using white refined flour (white breads, pastries, sweets). Lose weight if you are overweight. Losing just 5-10% of your body weight can help your overall health and prevent diseases such as diabetes and heart disease. Monitor your sodium intake, especially if you have high blood pressure. Talk with your health care provider about your sodium intake. Try to incorporate more vegetarian meals weekly. What foods should I eat? Fruits All fresh, canned (in natural juice), or frozen fruits. Vegetables Fresh or frozen vegetables (raw, steamed, roasted, or grilled). Green salads. Grains Most grains. Choose whole wheat and whole grains most of the time. Rice and pasta, including brown rice and pastas made with whole wheat. Meats and other proteins Lean, well-trimmed beef, veal, pork, and lamb. Chicken and [place] without skin. All fish and shellfish. Wild duck, rabbit, pheasant, and venison. Egg whites or low-cholesterol egg substitutes. Dried beans, peas, lentils, and tofu. Seeds and most nuts. Dairy Low-fat or nonfat cheeses, including ricotta and mozzarella. Skim or 1% milk (liquid, powdered, or evaporated). Buttermilk made with low-fat milk. Nonfat or low-fat yogurt. Fats and oils Non-hydrogenated (trans-free) margarines. Vegetable oils, including soybean, sesame, sunflower, olive, avocado, peanut, safflower, corn, canola, and cottonseed. Salad dressings or mayonnaise made with a vegetable oil. Beverages Water (mineral or sparkling). Coffee and tea. Unsweetened ice  tea. Diet beverages. Sweets and desserts Sherbet, gelatin, and fruit ice. Small amounts of dark chocolate. Limit all sweets and desserts. Seasonings and condiments All seasonings and condiments. The items listed above may not be a complete list of foods and beverages you can eat. Contact a dietitian for more options. What foods should I avoid? Fruits Canned fruit in heavy syrup. Fruit in cream or butter sauce. Fried fruit. Limit coconut. Vegetables Vegetables cooked in cheese, cream, or butter sauce. Fried vegetables. Grains Breads made with saturated or trans fats, oils, or whole milk. Croissants. Sweet rolls. Donuts. High-fat crackers, such as cheese crackers and chips. Meats and other proteins Fatty meats, such as hot dogs, ribs, sausage, bacon, rib-eye roast or steak. High-fat deli meats, such as salami and bologna. Caviar. Domestic duck and goose. Organ meats, such as liver. Dairy Cream, sour cream, cream cheese, and creamed cottage cheese. Whole-milk cheeses. Whole or 2% milk (liquid, evaporated, or condensed). Whole buttermilk. Cream sauce or high-fat cheese sauce. Whole-milk yogurt. Fats and oils Meat fat, or shortening. Cocoa butter, hydrogenated oils, palm oil, coconut oil, palm kernel oil. Solid fats and shortenings, including bacon fat, salt pork, lard, and butter. Nondairy cream substitutes. Salad dressings with cheese or sour cream. Beverages Regular sodas and any drinks with added sugar. Sweets and desserts Frosting. Pudding. Cookies. Cakes. Pies. Milk chocolate or white chocolate. Buttered syrups. Full-fat ice cream or ice cream drinks. The items listed above may  not be a complete list of foods and beverages to avoid. Contact a dietitian for more information. Summary Heart-healthy meal planning includes limiting unhealthy fats, increasing healthy fats, limiting salt (sodium) intake and making other diet and lifestyle changes. Lose weight if you are overweight. Losing just  5-10% of your body weight can help your overall health and prevent diseases such as diabetes and heart disease. Focus on eating a balance of foods, including fruits and vegetables, low-fat or nonfat dairy, lean protein, nuts and legumes, whole grains, and heart-healthy oils and fats. This information is not intended to replace advice given to you by your health care provider. Make sure you discuss any questions you have with your health care provider. Document Revised: 06/23/2021 Document Reviewed: 06/23/2021 Elsevier Patient Education  2024 [employer].

## 2023-12-28 DIAGNOSIS — J301 Allergic rhinitis due to pollen: Secondary | ICD-10-CM | POA: Diagnosis not present

## 2023-12-31 ENCOUNTER — Ambulatory Visit: Payer: Self-pay | Admitting: Nurse Practitioner

## 2024-01-03 DIAGNOSIS — J301 Allergic rhinitis due to pollen: Secondary | ICD-10-CM | POA: Diagnosis not present

## 2024-01-04 DIAGNOSIS — J301 Allergic rhinitis due to pollen: Secondary | ICD-10-CM | POA: Diagnosis not present

## 2024-01-11 DIAGNOSIS — J301 Allergic rhinitis due to pollen: Secondary | ICD-10-CM | POA: Diagnosis not present

## 2024-01-14 ENCOUNTER — Other Ambulatory Visit: Payer: Self-pay | Admitting: Nurse Practitioner

## 2024-01-14 DIAGNOSIS — E0590 Thyrotoxicosis, unspecified without thyrotoxic crisis or storm: Secondary | ICD-10-CM | POA: Diagnosis not present

## 2024-01-15 LAB — T3, FREE: T3, Free: 3.1 pg/mL (ref 2.0–4.4)

## 2024-01-15 LAB — T4, FREE: Free T4: 1.37 ng/dL (ref 0.82–1.77)

## 2024-01-15 LAB — TSH: TSH: 0.387 u[IU]/mL — ABNORMAL LOW (ref 0.450–4.500)

## 2024-01-18 NOTE — Telephone Encounter (Signed)
 Requested medication (s) are due for refill today: routing for review  Requested medication (s) are on the active medication list: no  Last refill:  08/21/23  Future visit scheduled: yes  Notes to clinic:  Unable to refill per protocol, historical medication/provider. Routing for review.     Requested Prescriptions  Pending Prescriptions Disp Refills   lisinopril  (ZESTRIL ) 20 MG tablet [Pharmacy Med Name: LISINOPRIL  20 MG TAB] 90 tablet     Sig: TAKE 1 TABLET BY MOUTH ONCE DAILY     Cardiovascular:  ACE Inhibitors Failed - 01/18/2024  9:08 AM      Failed - Last BP in normal range    BP Readings from Last 1 Encounters:  10/22/23 (!) 140/92         Passed - Cr in normal range and within 180 days    Creatinine, Ser  Date Value Ref Range Status  11/16/2023 0.80 0.76 - 1.27 mg/dL Final         Passed - K in normal range and within 180 days    Potassium  Date Value Ref Range Status  11/16/2023 4.0 3.5 - 5.2 mmol/L Final         Passed - Patient is not pregnant      Passed - Valid encounter within last 6 months    Recent Outpatient Visits           4 months ago Essential hypertension, benign   [facility] [place], Melanie DASEN, NP       Future Appointments             In 2 weeks Agbor-Etang, Redell, MD [facility]

## 2024-01-19 ENCOUNTER — Other Ambulatory Visit

## 2024-01-19 ENCOUNTER — Encounter: Payer: Self-pay | Admitting: "Endocrinology

## 2024-01-19 ENCOUNTER — Ambulatory Visit: Admitting: "Endocrinology

## 2024-01-19 VITALS — BP 122/78 | HR 60 | Ht 73.0 in | Wt 166.0 lb

## 2024-01-19 DIAGNOSIS — E0590 Thyrotoxicosis, unspecified without thyrotoxic crisis or storm: Secondary | ICD-10-CM

## 2024-01-19 DIAGNOSIS — E782 Mixed hyperlipidemia: Secondary | ICD-10-CM

## 2024-01-19 MED ORDER — METHIMAZOLE 5 MG PO TABS: 2.5000 mg | ORAL_TABLET | Freq: Every day | ORAL | 1 refills | Status: DC

## 2024-01-19 NOTE — Progress Notes (Signed)
 01/19/2024, 11:05 AM  Endocrinology follow-up note               Subjective:    Patient ID: Jonathan JINNY Leodis Mickey., male    DOB: 1955/08/12, PCP Valerio Melanie DASEN, NP   Past Medical History:  Diagnosis Date   Allergy    Allergy to dust    Arthritis    hands, hips, knees   Asthma    Cancer (HCC)    Depression    Hyperlipidemia    Hypertension    Hyperthyroidism    Past Surgical History:  Procedure Laterality Date   BIOPSY N/A 08/14/2016   Procedure: BIOPSY;  Surgeon: Rogelia Copping, MD;  Location: [facility];  Service: Endoscopy;  Laterality: N/A;   COLONOSCOPY WITH PROPOFOL  N/A 08/14/2016   Procedure: COLONOSCOPY WITH PROPOFOL ;  Surgeon: Rogelia Copping, MD;  Location: [facility];  Service: Endoscopy;  Laterality: N/A;   IMAGE GUIDED SINUS SURGERY Bilateral 09/24/2015   Procedure: IMAGE GUIDED SINUS SURGERY Bilateral nasal polypectomy,maxillary antrostomy with tissue removal,bilateral ethmoidectomy,frontal sinusotomy;  Surgeon: Deward Dolly, MD;  Location: [facility];  Service: ENT;  Laterality: Bilateral;  GAVE DISK TO CE CE 4-12 KP   NASAL SINUS SURGERY  2005   Dr. Lum Gaskins, [facility]   RADIOACTIVE SEED IMPLANT N/A 10/20/2022   Procedure: RADIOACTIVE SEED IMPLANT/BRACHYTHERAPY IMPLANT;  Surgeon: Twylla Glendia BROCKS, MD;  Location: [facility];  Service: Urology;  Laterality: N/A;  67 seeds implanted   Social History   Socioeconomic History   Marital status: Married    Spouse name: Sonny   Number of children: 2   Years of education: Not on file   Highest education level: Not on file  Occupational History   Not on file  Tobacco Use   Smoking status: Never    Passive exposure: Past   Smokeless tobacco: Never  Vaping Use   Vaping status: Never Used  Substance and Sexual Activity   Alcohol use: Not Currently    Alcohol/week: 0.0 standard drinks of alcohol    Comment: on occasion- about once a year    Drug use: No   Sexual activity: Yes  Other Topics Concern   Not on file  Social History Narrative   Not on file   Social Drivers of Health   Financial Resource Strain: Low Risk  (10/19/2023)   Overall Financial Resource Strain (CARDIA)    Difficulty of Paying Living Expenses: Not hard at all  Food Insecurity: No Food Insecurity (10/19/2023)   Hunger Vital Sign    Worried About Running Out of Food in the Last Year: Never true    Ran Out of Food in the Last Year: Never true  Transportation Needs: No Transportation Needs (10/19/2023)   PRAPARE - Administrator, Civil Service (Medical): No    Lack of Transportation (Non-Medical): No  Physical Activity: Sufficiently Active (10/19/2023)   Exercise Vital Sign    Days of Exercise per Week: 7 days    Minutes of Exercise per Session: 30 min  Stress: No Stress Concern Present (10/19/2023)   [employer] of Occupational Health - Occupational Stress Questionnaire    Feeling of Stress : Not at all  Social  Connections: Moderately Integrated (10/19/2023)   Social Connection and Isolation Panel    Frequency of Communication with Friends and Family: More than three times a week    Frequency of Social Gatherings with Friends and Family: More than three times a week    Attends Religious Services: More than 4 times per year    Active Member of [employer] or Organizations: No    Attends Banker Meetings: Never    Marital Status: Married   Family History  Problem Relation Age of Onset   Heart disease Mother    Depression Mother    Arthritis Sister    Emphysema Father    Depression Daughter    Outpatient Encounter Medications as of 01/19/2024  Medication Sig   methimazole  (TAPAZOLE ) 5 MG tablet Take 0.5 tablets (2.5 mg total) by mouth daily with breakfast.   albuterol  (PROVENTIL ) (2.5 MG/3ML) 0.083% nebulizer solution USE 1 VIAL (3 ML) (2.5 MG TOTAL) VIA NEBULIZER EVERY 6 HOURS AS NEEDED FOR WHEEZING OR SHORTNESS OF BREATH    albuterol  (VENTOLIN  HFA) 108 (90 Base) MCG/ACT inhaler Inhale 2 puffs into the lungs every 6 (six) hours as needed for wheezing or shortness of breath.   amLODipine  (NORVASC ) 10 MG tablet Take 1 tablet (10 mg total) by mouth daily.   ascorbic acid (VITAMIN C) 500 MG tablet Take 500 mg by mouth daily.   Aspirin  Buf,CaCarb-MgCarb-MgO, 81 MG TABS Take by mouth.   aspirin  EC 81 MG tablet Take 1 tablet (81 mg total) by mouth daily. Swallow whole.   atorvastatin  (LIPITOR) 80 MG tablet Take 0.5 tablets (40 mg total) by mouth daily.   b complex vitamins capsule Take 1 capsule by mouth daily.   fluticasone  (FLONASE ) 50 MCG/ACT nasal spray PLACE 2 SPRAYS INTO BOTH NOSTRILS ONCE DAILY   fluticasone -salmeterol (WIXELA INHUB) 100-50 MCG/ACT AEPB INHALE 1 PUFF INTO THE LUNGS TWICE DAILY   hydrochlorothiazide  (HYDRODIURIL ) 12.5 MG tablet TAKE 1 TABLET BY MOUTH ONCE DAILY   Krill Oil (OMEGA-3) 500 MG CAPS Take 1 capsule by mouth daily at 2 PM.   lisinopril  (ZESTRIL ) 20 MG tablet TAKE 1 TABLET BY MOUTH ONCE DAILY   loratadine (CLARITIN) 10 MG tablet Take 10 mg by mouth daily as needed for allergies.   Misc Natural Products (GLUCOSAMINE CHOND CMP DOUBLE PO) Take 1,500 mg by mouth daily. Chondrotin 1200 mg   NON FORMULARY Allergy injections weekly   PARoxetine  (PAXIL ) 20 MG tablet Take 1 tablet (20 mg total) by mouth daily.   tadalafil  (CIALIS ) 5 MG tablet Take 1 tablet (5 mg total) by mouth daily.   VITAMIN D , ERGOCALCIFEROL , PO Take 1 tablet by mouth daily.   Zinc  100 MG TABS Take by mouth.   No facility-administered encounter medications on file as of 01/19/2024.   ALLERGIES: Allergies  Allergen Reactions   Losartan  Shortness Of Breath   Monosodium Glutamate     headaches   Amoxicillin Rash   Erythromycin Rash   Penicillins Rash    VACCINATION STATUS: Immunization History  Administered Date(s) Administered   Influenza,inj,Quad PF,6+ Mos 05/11/2018   Influenza-Unspecified 03/02/2016, 03/22/2017,  03/16/2019   PFIZER(Purple Top)SARS-COV-2 Vaccination 08/11/2019, 09/01/2019   PNEUMOCOCCAL CONJUGATE-20 01/28/2022   Pneumococcal Polysaccharide-23 04/18/2014   Tdap 04/07/2013    HPI Jonathan Routh. is 68 y.o. male who presents today with a medical history as above. he is being seen in follow-up for hyperthyroidism.  He was taken off of his low-dose methimazole  during his last visit.  He returns  with labs consistent with mild relapse of hyperthyroidism.   - His prior thyroid  uptake and scan showed minimal uptake suggesting mild Graves' disease, but did not require ablative treatment.   He also has medical history of hyperlipidemia on whole food plant-based diet with great results-see below.  He is started on statin by his cardiologist due to high coronary calcium  score.    He denies palpitations, heat intolerance.  Denies dysphagia, shortness of breath, nor voice change. He underwent thyroid  ultrasound on March 02, 2022 which showed normal thyroid  ultrasound. Specifically, no evidence of thyromegaly,  atrophy or discrete thyroid  nodule or mass.    He has family history of hypothyroidism in his sister.  No family history of thyroid  malignancy. His other medical problems include hypertension-uncontrolled despite 2 medications at appropriate doses. He is status post brachytherapy with radioactive seed implant to treat prostate cancer.  He is on regular urology follow-up.  He is a non-smoker, nonalcohol user.  Review of Systems    Objective:       01/19/2024    9:51 AM 10/22/2023   10:49 AM 10/19/2023   11:22 AM  Vitals with BMI  Height 6' 1 6' 1 6' 0  Weight 166 lbs 167 lbs 10 oz 170 lbs  BMI 21.91 22.12 23.05  Systolic 122 140 --  Diastolic 78 92 --  Pulse 60 57     BP 122/78   Pulse 60   Ht 6' 1 (1.854 m)   Wt 166 lb (75.3 kg)   BMI 21.90 kg/m   Wt Readings from Last 3 Encounters:  01/19/24 166 lb (75.3 kg)  10/22/23 167 lb 9.6 oz (76 kg)  10/19/23 170 lb  (77.1 kg)    Physical Exam    CMP ( most recent) CMP     Component Value Date/Time   NA 134 11/16/2023 1108   K 4.0 11/16/2023 1108   CL 93 (L) 11/16/2023 1108   CO2 21 11/16/2023 1108   GLUCOSE 77 11/16/2023 1108   BUN 6 (L) 11/16/2023 1108   CREATININE 0.80 11/16/2023 1108   CALCIUM  9.7 11/16/2023 1108   PROT 7.7 11/16/2023 1108   ALBUMIN 4.7 11/16/2023 1108   AST 19 11/16/2023 1108   ALT 14 11/16/2023 1108   ALKPHOS 94 11/16/2023 1108   BILITOT 1.3 (H) 11/16/2023 1108   GFRNONAA 93 06/24/2020 1555   GFRAA 108 06/24/2020 1555       Lipid Panel ( most recent) Lipid Panel     Component Value Date/Time   CHOL 177 11/16/2023 1108   TRIG 53 11/16/2023 1108   HDL 92 11/16/2023 1108   CHOLHDL 1.9 11/16/2023 1108   CHOLHDL 2.0 10/12/2023 1203   VLDL 7 10/12/2023 1203   LDLCALC 74 11/16/2023 1108   LABVLDL 11 11/16/2023 1108      Lab Results  Component Value Date   TSH 0.387 (L) 01/14/2024   TSH 1.000 11/16/2023   TSH 0.902 10/12/2023   TSH 1.520 08/17/2023   TSH 1.390 03/09/2023   TSH 1.430 10/27/2022   TSH 1.420 08/21/2022   TSH 0.170 (L) 07/30/2022   TSH <0.005 (L) 05/11/2022   TSH 0.179 (L) 01/28/2022   FREET4 1.37 01/14/2024   FREET4 1.42 11/16/2023   FREET4 0.91 10/12/2023   FREET4 1.35 08/17/2023   FREET4 1.38 03/09/2023   FREET4 1.26 10/27/2022   FREET4 1.17 08/21/2022   FREET4 1.17 07/30/2022   FREET4 1.78 (H) 05/11/2022   FREET4 1.40 12/23/2020    Reviewed  ultrasound on March 02, 2022: Total right and left lobes measure 5.1 cm in greatest dimension. Normal thyroid  ultrasound. Specifically, no evidence of thyromegaly, atrophy or discrete thyroid  nodule or mass.  Thyroid  uptake and scan on May 05, 2022  FINDINGS: Homogeneous tracer distribution in both thyroid  lobes.   No focal areas of increased or decreased tracer localization.   4 hour I-123 uptake = 8.7% (normal 5-20%),  24 hour I-123 uptake = 21.9% (normal 10-30%)    IMPRESSION: Normal exam.   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 Hyperlipidemia 3. Hypertension  Jonathan Roberson presents with mild relapse of hyperthyroidism, will benefit from reinitiation of low-dose methimazole .  I discussed and prescribed methimazole  2.5 mg p.o. daily with breakfast with plan to repeat labs and office visit in 6 months.   Previous Thyroid  ultrasound is unremarkable, previous thyroid  uptake and scan showed minimal uptake consistent with mild Graves' disease.  -His previsit labs show significantly improved lipid panel on mainly plant-based diet, LDL stabilizing at 74.  He is advised by his cardiologist to start statin due to high coronary calcium  score.  He did not start the statins yet.  I support this recommendation, and advised patient to start his statins.  I answered his questions on side effects and complications of statin therapy.    He is encouraged to stay close to his urologist for the care of his prostate cancer. He request that PSA will be added to his upcoming labs.  His blood pressure is better controlled with his current regimen, wishes to address that with PCP.    - he is advised to maintain close follow up with [name], Jolene T, NP for primary care needs.   I spent  20  minutes in the care of the patient today including review of labs from Thyroid  Function, CMP, and other relevant labs ; imaging/biopsy records (current and previous including abstractions from other facilities); face-to-face time discussing  his lab results and symptoms, medications doses, his options of short and long term treatment based on the latest standards of care / guidelines;   and documenting the encounter.  Jonathan JINNY Leodis Mickey.  participated in the discussions, expressed understanding, and voiced agreement with the above plans.  All questions were answered to his satisfaction. he is encouraged to contact clinic should he have any questions or concerns  prior to his return visit.    Follow up plan: Return in about 6 months (around 07/21/2024) for F/U with Pre-visit Labs.   Ranny Earl, MD [facility] [address] [place], [place] [address] Phone: [phone]  Fax: [phone]     01/19/2024, 11:05 AM  This note was partially dictated with voice recognition software. Similar sounding words can be transcribed inadequately or may not  be corrected upon review.

## 2024-01-20 ENCOUNTER — Ambulatory Visit: Attending: Cardiology

## 2024-01-20 DIAGNOSIS — I25110 Atherosclerotic heart disease of native coronary artery with unstable angina pectoris: Secondary | ICD-10-CM

## 2024-01-20 DIAGNOSIS — I2510 Atherosclerotic heart disease of native coronary artery without angina pectoris: Secondary | ICD-10-CM

## 2024-01-20 LAB — ECHOCARDIOGRAM COMPLETE
AR max vel: 2.97 cm2
AV Area VTI: 2.95 cm2
AV Area mean vel: 2.98 cm2
AV Mean grad: 4 mmHg
AV Peak grad: 7.5 mmHg
Ao pk vel: 1.37 m/s
Area-P 1/2: 2.49 cm2
S' Lateral: 4.36 cm

## 2024-01-24 ENCOUNTER — Ambulatory Visit: Admitting: Cardiology

## 2024-01-25 DIAGNOSIS — J301 Allergic rhinitis due to pollen: Secondary | ICD-10-CM | POA: Diagnosis not present

## 2024-01-30 NOTE — Patient Instructions (Signed)
Wegovy or Zepbound  Focus on DASH diet for high blood pressure or Mediterranean diet  Be Involved in Caring For Your Health:  Taking Medications When medications are taken as directed, they can greatly improve your health. But if they are not taken as prescribed, they may not work. In some cases, not taking them correctly can be harmful. To help ensure your treatment remains effective and safe, understand your medications and how to take them. Bring your medications to each visit for review by your provider.  Your lab results, notes, and after visit summary will be available on My Chart. We strongly encourage you to use this feature. If lab results are abnormal the clinic will contact you with the appropriate steps. If the clinic does not contact you assume the results are satisfactory. You can always view your results on My Chart. If you have questions regarding your health or results, please contact the clinic during office hours. You can also ask questions on My Chart.  We at [facility] are grateful that you chose us to provide your care. We strive to provide evidence-based and compassionate care and are always looking for feedback. If you get a survey from the clinic please complete this so we can hear your opinions.  Preventing High Cholesterol Cholesterol is a white, waxy substance similar to fat that the human body needs to help build cells. The liver makes all the cholesterol that a person's body needs. Having high cholesterol (hypercholesterolemia) increases your risk for heart disease and stroke. Extra or excess cholesterol comes from the food that you eat. High cholesterol can often be prevented with diet and lifestyle changes. If you already have high cholesterol, you can control it with diet, lifestyle changes, and medicines. How can high cholesterol affect me? If you have high cholesterol, fatty deposits (plaques) may build up on the walls of your blood vessels. The blood  vessels that carry blood away from your heart are called arteries. Plaques make the arteries narrower and stiffer. This in turn can: Restrict or block blood flow and cause blood clots to form. Increase your risk for heart attack and stroke. What can increase my risk for high cholesterol? This condition is more likely to develop in people who: Eat foods that are high in saturated fat or cholesterol. Saturated fat is mostly found in foods that come from animal sources. Are overweight. Are not getting enough exercise. Use products that contain nicotine or tobacco, such as cigarettes, e-cigarettes, and chewing tobacco. Have a family history of high cholesterol (familial hypercholesterolemia). What actions can I take to prevent this? Nutrition  Eat less saturated fat. Avoid trans fats (partially hydrogenated oils). These are often found in margarine and in some baked goods, fried foods, and snacks bought in packages. Avoid precooked or cured meat, such as bacon, sausages, or meat loaves. Avoid foods and drinks that have added sugars. Eat more fruits, vegetables, and whole grains. Choose healthy sources of protein, such as fish, poultry, lean cuts of red meat, beans, peas, lentils, and nuts. Choose healthy sources of fat, such as: Nuts. Vegetable oils, especially olive oil. Fish that have healthy fats, such as omega-3 fatty acids. These fish include mackerel or salmon. Lifestyle Lose weight if you are overweight. Maintaining a healthy body mass index (BMI) can help prevent or control high cholesterol. It can also lower your risk for diabetes and high blood pressure. Ask your health care provider to help you with a diet and exercise plan to lose   weight safely. Do not use any products that contain nicotine or tobacco. These products include cigarettes, chewing tobacco, and vaping devices, such as e-cigarettes. If you need help quitting, ask your health care provider. Alcohol use Do not drink  alcohol if: Your health care provider tells you not to drink. You are pregnant, may be pregnant, or are planning to become pregnant. If you drink alcohol: Limit how much you have to: 0-1 drink a day for women. 0-2 drinks a day for men. Know how much alcohol is in your drink. In the [place] one drink equals one 12 oz bottle of beer (355 mL), one 5 oz glass of wine (148 mL), or one 1 oz glass of hard liquor (44 mL). Activity  Get enough exercise. Do exercises as told by your health care provider. Each week, do at least 150 minutes of exercise that takes a medium level of effort (moderate-intensity exercise). This kind of exercise: Makes your heart beat faster while allowing you to still be able to talk. Can be done in short sessions several times a day or longer sessions a few times a week. For example, on 5 days each week, you could walk fast or ride your bike 3 times a day for 10 minutes each time. Medicines Your health care provider may recommend medicines to help lower cholesterol. This may be a medicine to lower the amount of cholesterol that your liver makes. You may need medicine if: Diet and lifestyle changes have not lowered your cholesterol enough. You have high cholesterol and other risk factors for heart disease or stroke. Take over-the-counter and prescription medicines only as told by your health care provider. General information Manage your risk factors for high cholesterol. Talk with your health care provider about all your risk factors and how to lower your risk. Manage other conditions that you have, such as diabetes or high blood pressure (hypertension). Have blood tests to check your cholesterol levels at regular points in time as told by your health care provider. Keep all follow-up visits. This is important. Where to find more information American Heart Association: www.heart.org National Heart, Lung, and Blood Institute: www.nhlbi.nih.gov Summary High cholesterol  increases your risk for heart disease and stroke. By keeping your cholesterol level low, you can reduce your risk for these conditions. High cholesterol can often be prevented with diet and lifestyle changes. Work with your health care provider to manage your risk factors, and have your blood tested regularly. This information is not intended to replace advice given to you by your health care provider. Make sure you discuss any questions you have with your health care provider. Document Revised: 12/19/2021 Document Reviewed: 07/22/2020 Elsevier Patient Education  2024 [employer]vier Inc.  

## 2024-02-01 ENCOUNTER — Other Ambulatory Visit

## 2024-02-01 DIAGNOSIS — J301 Allergic rhinitis due to pollen: Secondary | ICD-10-CM | POA: Diagnosis not present

## 2024-02-02 ENCOUNTER — Encounter: Payer: Self-pay | Admitting: Nurse Practitioner

## 2024-02-02 ENCOUNTER — Ambulatory Visit (INDEPENDENT_AMBULATORY_CARE_PROVIDER_SITE_OTHER): Admitting: Nurse Practitioner

## 2024-02-02 VITALS — BP 116/76 | HR 66 | Temp 98.4°F | Ht 73.0 in | Wt 166.0 lb

## 2024-02-02 DIAGNOSIS — I10 Essential (primary) hypertension: Secondary | ICD-10-CM | POA: Diagnosis not present

## 2024-02-02 DIAGNOSIS — E0590 Thyrotoxicosis, unspecified without thyrotoxic crisis or storm: Secondary | ICD-10-CM

## 2024-02-02 DIAGNOSIS — F3342 Major depressive disorder, recurrent, in full remission: Secondary | ICD-10-CM | POA: Diagnosis not present

## 2024-02-02 DIAGNOSIS — E7800 Pure hypercholesterolemia, unspecified: Secondary | ICD-10-CM

## 2024-02-02 DIAGNOSIS — C61 Malignant neoplasm of prostate: Secondary | ICD-10-CM

## 2024-02-02 NOTE — Assessment & Plan Note (Signed)
 Chronic, ongoing. Continue to collaborate with cardiology.  He has not started statin therapy as of yet.  Will discuss with cardiology whether a CT calcium  scoring would be beneficial.

## 2024-02-02 NOTE — Progress Notes (Signed)
 BP 116/76   Pulse 66   Temp 98.4 F (36.9 C)   Ht 6' 1 (1.854 m)   Wt 166 lb (75.3 kg)   SpO2 90%   BMI 21.90 kg/m    Subjective:    Patient ID: Jonathan JINNY Leodis Mickey., male    DOB: 01/01/1956, 68 y.o.   MRN: [id number]  HPI: Jonathan Camarena. is a 68 y.o. male  Chief Complaint  Patient presents with   Hypertension   HYPERTENSION without Chronic Kidney Disease  Taking Amlodipine , HCTZ, Lisinopril . Saw endo on 01/19/24 for thyroid  check and they restart Methimazole . He reports overall not feeling well, thinks the thyroid  is throwing him off.   Saw cardiology on 10/22/23, ordered Atorvastatin  but he has not started due to with thyroid  not feeling well.   Hypertension status: stable  Satisfied with current treatment? yes Duration of hypertension: chronic BP monitoring frequency:  rarely BP range:  BP medication side effects:  no Medication compliance: good compliance Aspirin : yes Recurrent headaches: no Visual changes: no Palpitations: no Dyspnea: no Chest pain: no Lower extremity edema: no Dizzy/lightheaded: no  The 10-year ASCVD risk score (Arnett DK, et al., 2019) is: 9.8%   Values used to calculate the score:     Age: 22 years     Clincally relevant sex: Male     Is Non-Hispanic African American: No     Diabetic: No     Tobacco smoker: No     Systolic Blood Pressure: 116 mmHg     Is BP treated: Yes     HDL Cholesterol: 92 mg/dL     Total Cholesterol: 177 mg/dL     0/10/7972    0:45 AM 10/19/2023   11:31 AM 08/23/2023   10:47 AM 07/01/2023   10:27 AM 03/30/2023    3:13 PM  Depression screen PHQ 2/9  Decreased Interest 2 0 1 1 0  Down, Depressed, Hopeless 2 0 0 1 0  PHQ - 2 Score 4 0 1 2 0  Altered sleeping 2 0 1 1 1   Tired, decreased energy 2 0 1 1 0  Change in appetite 0 0 1 0 0  Feeling bad or failure about yourself  0 0 0 0 0  Trouble concentrating 0 0 0 0 1  Moving slowly or fidgety/restless 1 0 0 0 1  Suicidal thoughts 0 0 0 0 0  PHQ-9 Score 9 0 4  4 3   Difficult doing work/chores  Not difficult at all Not difficult at all Not difficult at all Not difficult at all       02/02/2024    9:54 AM 08/23/2023   10:47 AM 07/01/2023   10:27 AM 03/30/2023    3:13 PM  GAD 7 : Generalized Anxiety Score  Nervous, Anxious, on Edge 2 1 1 1   Control/stop worrying 0 1 0 0  Worry too much - different things 0 1 0 0  Trouble relaxing 2 0 1 0  Restless 0 0 1 0  Easily annoyed or irritable 2 0 0 1  Afraid - awful might happen 0 0 0 1  Total GAD 7 Score 6 3 3 3   Anxiety Difficulty  Not difficult at all Not difficult at all Somewhat difficult      Relevant past medical, surgical, family and social history reviewed and updated as indicated. Interim medical history since our last visit reviewed. Allergies and medications reviewed and updated.  Review of Systems  Constitutional:  Negative for  activity change, diaphoresis, fatigue and fever.  Respiratory:  Negative for cough, chest tightness, shortness of breath and wheezing.   Cardiovascular:  Negative for chest pain, palpitations and leg swelling.  Gastrointestinal: Negative.   Endocrine: Negative for cold intolerance and heat intolerance.  Neurological: Negative.   Psychiatric/Behavioral: Negative.      Per HPI unless specifically indicated above     Objective:    BP 116/76   Pulse 66   Temp 98.4 F (36.9 C)   Ht 6' 1 (1.854 m)   Wt 166 lb (75.3 kg)   SpO2 90%   BMI 21.90 kg/m   Wt Readings from Last 3 Encounters:  02/02/24 166 lb (75.3 kg)  01/19/24 166 lb (75.3 kg)  10/22/23 167 lb 9.6 oz (76 kg)    Physical Exam Vitals and nursing note reviewed.  Constitutional:      General: He is awake. He is not in acute distress.    Appearance: He is well-developed and well-groomed. He is not ill-appearing or toxic-appearing.  HENT:     Head: Normocephalic.     Right Ear: Hearing and external ear normal.     Left Ear: Hearing and external ear normal.  Eyes:     General: Lids are  normal.     Extraocular Movements: Extraocular movements intact.     Conjunctiva/sclera: Conjunctivae normal.  Neck:     Thyroid : No thyromegaly.     Vascular: No carotid bruit.  Cardiovascular:     Rate and Rhythm: Normal rate and regular rhythm.     Heart sounds: Normal heart sounds. No murmur heard.    No gallop.  Pulmonary:     Effort: No accessory muscle usage or respiratory distress.     Breath sounds: Normal breath sounds.  Abdominal:     General: Bowel sounds are normal. There is no distension.     Palpations: Abdomen is soft.     Tenderness: There is no abdominal tenderness.  Musculoskeletal:     Cervical back: Full passive range of motion without pain.     Right lower leg: No edema.     Left lower leg: No edema.  Lymphadenopathy:     Cervical: No cervical adenopathy.  Skin:    General: Skin is warm.     Capillary Refill: Capillary refill takes less than 2 seconds.  Neurological:     Mental Status: He is alert and oriented to person, place, and time.     Deep Tendon Reflexes: Reflexes are normal and symmetric.     Reflex Scores:      Brachioradialis reflexes are 2+ on the right side and 2+ on the left side.      Patellar reflexes are 2+ on the right side and 2+ on the left side. Psychiatric:        Attention and Perception: Attention normal.        Mood and Affect: Mood normal.        Speech: Speech normal.        Behavior: Behavior normal. Behavior is cooperative.        Thought Content: Thought content normal.     Results for orders placed or performed in visit on 01/20/24  ECHOCARDIOGRAM COMPLETE   Collection Time: 01/20/24  3:34 PM  Result Value Ref Range   AR max vel 2.97 cm2   AV Peak grad 7.5 mmHg   Ao pk vel 1.37 m/s   S' Lateral 4.36 cm   Area-P 1/2 2.49 cm2  AV Area VTI 2.95 cm2   AV Mean grad 4.0 mmHg   AV Area mean vel 2.98 cm2   Est EF 60 - 65%       Assessment & Plan:   Problem List Items Addressed This Visit       Cardiovascular  and Mediastinum   Essential hypertension, benign   Chronic, ongoing.  BP at goal in office today.  Continue current medication regimen and adjust as needed, could consider reduction in future.  Would avoid BB due to active lifestyle and lower HR at baseline. Recommend he monitor BP at least a few mornings a week at home and document.  DASH diet at home.  Continue current medication regimen and adjust as needed.  Labs today: up to date. If elevations >130/80 then add hydrochlorothiazide  12.5 MG.         Endocrine   Hyperthyroidism   Diagnosed 05/08/22.  Followed by endocrinology.  Continue current medication regimen as ordered by them.  Recent notes reviewed.  Thyroid  labs up to date.        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  Continue current medication regimen and adjust as needed. Denies SI/HI.  Utilizes exercise for mood, which is beneficial.      Hyperlipidemia   Chronic, ongoing. Continue to collaborate with cardiology.  He has not started statin therapy as of yet.  Will discuss with cardiology whether a CT calcium  scoring would be beneficial.        Follow up plan: Return in about 5 months (around 07/04/2024) for Annual Physical after 06/30/24.

## 2024-02-02 NOTE — Assessment & Plan Note (Signed)
 Chronic, ongoing.  BP at goal in office today.  Continue current medication regimen and adjust as needed, could consider reduction in future.  Would avoid BB due to active lifestyle and lower HR at baseline. Recommend he monitor BP at least a few mornings a week at home and document.  DASH diet at home.  Continue current medication regimen and adjust as needed.  Labs today: up to date. If elevations >130/80 then add hydrochlorothiazide  12.5 MG.

## 2024-02-02 NOTE — Assessment & Plan Note (Signed)
 Diagnosed 05/08/22.  Followed by endocrinology.  Continue current medication regimen as ordered by them.  Recent notes reviewed.  Thyroid labs up to date.

## 2024-02-02 NOTE — Assessment & Plan Note (Signed)
 Chronic, stable.  Continue current medication regimen and adjust as needed. Denies SI/HI.  Utilizes exercise for mood, which is beneficial.

## 2024-02-05 ENCOUNTER — Other Ambulatory Visit: Payer: Self-pay | Admitting: Nurse Practitioner

## 2024-02-07 ENCOUNTER — Ambulatory Visit: Attending: Cardiology | Admitting: Cardiology

## 2024-02-07 ENCOUNTER — Encounter: Payer: Self-pay | Admitting: Cardiology

## 2024-02-07 VITALS — BP 100/64 | HR 73 | Ht 73.0 in | Wt 169.0 lb

## 2024-02-07 DIAGNOSIS — I2510 Atherosclerotic heart disease of native coronary artery without angina pectoris: Secondary | ICD-10-CM | POA: Diagnosis not present

## 2024-02-07 DIAGNOSIS — E785 Hyperlipidemia, unspecified: Secondary | ICD-10-CM | POA: Diagnosis not present

## 2024-02-07 DIAGNOSIS — I10 Essential (primary) hypertension: Secondary | ICD-10-CM

## 2024-02-07 NOTE — Telephone Encounter (Signed)
 Requested Prescriptions  Pending Prescriptions Disp Refills   amLODipine  (NORVASC ) 10 MG tablet [Pharmacy Med Name: AMLODIPINE  BESYLATE 10 MG TAB] 90 tablet 1    Sig: TAKE 1 TABLET BY MOUTH ONCE DAILY     Cardiovascular: Calcium  Channel Blockers 2 Passed - 02/07/2024  2:12 PM      Passed - Last BP in normal range    BP Readings from Last 1 Encounters:  02/02/24 116/76         Passed - Last Heart Rate in normal range    Pulse Readings from Last 1 Encounters:  02/02/24 66         Passed - Valid encounter within last 6 months    Recent Outpatient Visits           5 days ago Recurrent major depressive disorder, in full remission (HCC)   [facility] [place], [place] T, NP   5 months ago Essential hypertension, benign   [facility] [place], Melanie DASEN, NP       Future Appointments             Today Darliss Rogue, MD [facility]

## 2024-02-07 NOTE — Progress Notes (Signed)
 Cardiology Office Note:    Date:  02/07/2024   ID:  Jonathan JINNY Leodis Mickey., DOB 24-Oct-1955, MRN [id number]  PCP:  Valerio Melanie DASEN, NP   [facility] Providers Cardiologist:  None     Referring MD: Valerio Melanie DASEN, NP   Chief Complaint  Patient presents with   Follow-up    Pt doing good. Just medication changed    History of Present Illness:    Jonathan Roberson. is a 68 y.o. male with a hx of CAD (LAD, RCA, LCx calcifications on chest CT), hypertension, prostate cancer s/p resection, RT 2024, hyperthyroidism who presents for follow-up.  Previously seen due to coronary calcifications, echocardiogram obtained to evaluate any significant structural abnormalities.  Patient denies chest pain or shortness of breath.  Started on Lipitor 40 mg daily, aspirin  81 mg daily.  Tolerating medications, no side effects.  Parental/testing Patient had a NM PET scan 04/2022, three-vessel coronary calcifications and aortic atherosclerosis was noted.    Past Medical History:  Diagnosis Date   Allergy    Allergy to dust    Arthritis    hands, hips, knees   Asthma    Cancer (HCC)    Depression    Hyperlipidemia    Hypertension    Hyperthyroidism     Past Surgical History:  Procedure Laterality Date   BIOPSY N/A 08/14/2016   Procedure: BIOPSY;  Surgeon: Rogelia Copping, MD;  Location: [facility];  Service: Endoscopy;  Laterality: N/A;   COLONOSCOPY WITH PROPOFOL  N/A 08/14/2016   Procedure: COLONOSCOPY WITH PROPOFOL ;  Surgeon: Rogelia Copping, MD;  Location: [facility];  Service: Endoscopy;  Laterality: N/A;   IMAGE GUIDED SINUS SURGERY Bilateral 09/24/2015   Procedure: IMAGE GUIDED SINUS SURGERY Bilateral nasal polypectomy,maxillary antrostomy with tissue removal,bilateral ethmoidectomy,frontal sinusotomy;  Surgeon: Deward Dolly, MD;  Location: [facility];  Service: ENT;  Laterality: Bilateral;  GAVE DISK TO CE CE 4-12 KP   NASAL SINUS SURGERY  2005   Dr. Lum Gaskins, [facility]   RADIOACTIVE SEED IMPLANT N/A 10/20/2022   Procedure: RADIOACTIVE SEED IMPLANT/BRACHYTHERAPY IMPLANT;  Surgeon: Twylla Glendia BROCKS, MD;  Location: [facility] ORS;  Service: Urology;  Laterality: N/A;  67 seeds implanted    Current Medications: Current Meds  Medication Sig   albuterol  (PROVENTIL ) (2.5 MG/3ML) 0.083% nebulizer solution USE 1 VIAL (3 ML) (2.5 MG TOTAL) VIA NEBULIZER EVERY 6 HOURS AS NEEDED FOR WHEEZING OR SHORTNESS OF BREATH   albuterol  (VENTOLIN  HFA) 108 (90 Base) MCG/ACT inhaler Inhale 2 puffs into the lungs every 6 (six) hours as needed for wheezing or shortness of breath.   amLODipine  (NORVASC ) 10 MG tablet TAKE 1 TABLET BY MOUTH ONCE DAILY   ascorbic acid (VITAMIN C) 500 MG tablet Take 500 mg by mouth daily.   Aspirin  Buf,CaCarb-MgCarb-MgO, 81 MG TABS Take by mouth.   aspirin  EC 81 MG tablet Take 1 tablet (81 mg total) by mouth daily. Swallow whole.   atorvastatin  (LIPITOR) 80 MG tablet Take 0.5 tablets (40 mg total) by mouth daily.   b complex vitamins capsule Take 1 capsule by mouth daily.   fluticasone  (FLONASE ) 50 MCG/ACT nasal spray PLACE 2 SPRAYS INTO BOTH NOSTRILS ONCE DAILY   fluticasone -salmeterol (WIXELA INHUB) 100-50 MCG/ACT AEPB INHALE 1 PUFF INTO THE LUNGS TWICE DAILY   hydrochlorothiazide  (HYDRODIURIL ) 12.5 MG tablet TAKE 1 TABLET BY MOUTH ONCE DAILY   Krill Oil (OMEGA-3) 500 MG CAPS Take 1 capsule by mouth daily at 2 PM.   lisinopril  (  ZESTRIL ) 20 MG tablet TAKE 1 TABLET BY MOUTH ONCE DAILY   loratadine (CLARITIN) 10 MG tablet Take 10 mg by mouth daily as needed for allergies.   methimazole  (TAPAZOLE ) 5 MG tablet Take 0.5 tablets (2.5 mg total) by mouth daily with breakfast.   Misc Natural Products (GLUCOSAMINE CHOND CMP DOUBLE PO) Take 1,500 mg by mouth daily. Chondrotin 1200 mg   NON FORMULARY Allergy injections weekly   PARoxetine  (PAXIL ) 20 MG tablet Take 1 tablet (20 mg total) by mouth daily.   tadalafil  (CIALIS ) 5 MG tablet Take 1 tablet (5 mg  total) by mouth daily.   VITAMIN D , ERGOCALCIFEROL , PO Take 1 tablet by mouth daily.   Zinc  100 MG TABS Take by mouth.     Allergies:   Losartan , Monosodium glutamate, Amoxicillin, Erythromycin, and Penicillins   Social History   Socioeconomic History   Marital status: Married    Spouse name: Jonathan Roberson   Number of children: 2   Years of education: Not on file   Highest education level: Bachelor's degree (e.g., BA, AB, BS)  Occupational History   Not on file  Tobacco Use   Smoking status: Never    Passive exposure: Past   Smokeless tobacco: Never  Vaping Use   Vaping status: Never Used  Substance and Sexual Activity   Alcohol use: Not Currently    Alcohol/week: 0.0 standard drinks of alcohol    Comment: on occasion- about once a year   Drug use: No   Sexual activity: Yes  Other Topics Concern   Not on file  Social History Narrative   Not on file   Social Drivers of Health   Financial Resource Strain: Low Risk  (01/31/2024)   Overall Financial Resource Strain (CARDIA)    Difficulty of Paying Living Expenses: Not hard at all  Food Insecurity: No Food Insecurity (01/31/2024)   Hunger Vital Sign    Worried About Running Out of Food in the Last Year: Never true    Ran Out of Food in the Last Year: Never true  Transportation Needs: No Transportation Needs (01/31/2024)   PRAPARE - Administrator, Civil Service (Medical): No    Lack of Transportation (Non-Medical): No  Physical Activity: Sufficiently Active (01/31/2024)   Exercise Vital Sign    Days of Exercise per Week: 4 days    Minutes of Exercise per Session: 40 min  Stress: Stress Concern Present (01/31/2024)   [employer] of Occupational Health - Occupational Stress Questionnaire    Feeling of Stress: To some extent  Social Connections: Socially Integrated (01/31/2024)   Social Connection and Isolation Panel    Frequency of Communication with Friends and Family: More than three times a week    Frequency of  Social Gatherings with Friends and Family: Twice a week    Attends Religious Services: More than 4 times per year    Active Member of [employer] or Organizations: Yes    Attends Banker Meetings: 1 to 4 times per year    Marital Status: Married     Family History: The patient's family history includes Arthritis in his sister; Depression in his daughter and mother; Emphysema in his father; Heart disease in his mother.  ROS:   Please see the history of present illness.     All other systems reviewed and are negative.  EKGs/Labs/Other Studies Reviewed:    The following studies were reviewed today:  EKG Interpretation Date/Time:  Monday February 07 2024 14:36:51 EDT Ventricular  Rate:  73 PR Interval:  158 QRS Duration:  90 QT Interval:  378 QTC Calculation: 416 R Axis:   -54  Text Interpretation: Sinus rhythm Left axis deviation Confirmed by Darliss Rogue ([id number]) on 02/07/2024 2:42:24 PM    Recent Labs: 08/17/2023: Hemoglobin 14.0; Platelets 192 11/16/2023: ALT 14; BUN 6; Creatinine, Ser 0.80; Potassium 4.0; Sodium 134 01/14/2024: TSH 0.387  Recent Lipid Panel    Component Value Date/Time   CHOL 177 11/16/2023 1108   TRIG 53 11/16/2023 1108   HDL 92 11/16/2023 1108   CHOLHDL 1.9 11/16/2023 1108   CHOLHDL 2.0 10/12/2023 1203   VLDL 7 10/12/2023 1203   LDLCALC 74 11/16/2023 1108     Risk Assessment/Calculations:         Physical Exam:    VS:  BP 100/64 (BP Location: Right Arm, Patient Position: Sitting, Cuff Size: Normal)   Pulse 73   Ht 6' 1 (1.854 m)   Wt 169 lb (76.7 kg)   SpO2 97%   BMI 22.30 kg/m     Wt Readings from Last 3 Encounters:  02/07/24 169 lb (76.7 kg)  02/02/24 166 lb (75.3 kg)  01/19/24 166 lb (75.3 kg)     GEN:  Well nourished, well developed in no acute distress HEENT: Normal NECK: No JVD; No carotid bruits CARDIAC: RRR, no murmurs, rubs, gallops RESPIRATORY:  Clear to auscultation without rales, wheezing or rhonchi   ABDOMEN: Soft, non-tender, non-distended MUSCULOSKELETAL:  No edema; No deformity  SKIN: Warm and dry NEUROLOGIC:  Alert and oriented x 3 PSYCHIATRIC:  Normal affect   ASSESSMENT:    1. Coronary artery disease involving native heart, unspecified vessel or lesion type, unspecified whether angina present   2. Primary hypertension   3. Dyslipidemia, goal LDL below 70    PLAN:    In order of problems listed above:  CAD, LAD, LCx, RCA calcifications on chest CT 04/2022.  Denies chest pain.  Echo 8/25 EF 60 to 65%.  Continue aspirin  81 mg daily, Lipitor 40 mg daily.  Patient is asymptomatic, consider left heart cath if patient becomes symptomatic due to significant three-vessel calcifications. Hypertension, BP controlled.  Continue lisinopril  20 mg daily, Norvasc  10 mg daily, HCTZ 12.5 mg daily. Cholesterol controlled, continue Lipitor 40 mg daily.  Plans to obtain lipid panel with PCP in about 4 to 5 months.  Follow-up in 6 months      Medication Adjustments/Labs and Tests Ordered: Current medicines are reviewed at length with the patient today.  Concerns regarding medicines are outlined above.  Orders Placed This Encounter  Procedures   EKG 12-Lead   No orders of the defined types were placed in this encounter.   Patient Instructions  Medication Instructions:  Your physician recommends that you continue on your current medications as directed. Please refer to the Current Medication list given to you today.   *If you need a refill on your cardiac medications before your next appointment, please call your pharmacy*  Lab Work: No labs ordered today  If you have labs (blood work) drawn today and your tests are completely normal, you will receive your results only by: MyChart Message (if you have MyChart) OR A paper copy in the mail If you have any lab test that is abnormal or we need to change your treatment, we will call you to review the results.  Testing/Procedures: No test  ordered today   Follow-Up: At [facility], you and your health needs are our priority.  As part of our continuing mission to provide you with exceptional heart care, our providers are all part of one team.  This team includes your primary Cardiologist (physician) and Advanced Practice Providers or APPs (Physician Assistants and Nurse Practitioners) who all work together to provide you with the care you need, when you need it.  Your next appointment:   6 month(s)  Provider:   You may see Dr. Darliss or one of the following Advanced Practice Providers on your designated Care Team:   Lonni Meager, NP Lesley Maffucci, PA-C Bernardino Bring, PA-C Cadence Spencer, PA-C Tylene Lunch, NP Barnie Hila, NP    We recommend signing up for the patient portal called MyChart.  Sign up information is provided on this After Visit Summary.  MyChart is used to connect with patients for Virtual Visits (Telemedicine).  Patients are able to view lab/test results, encounter notes, upcoming appointments, etc.  Non-urgent messages can be sent to your provider as well.   To learn more about what you can do with MyChart, go to [email or web address].     Signed, Redell Darliss, MD  02/07/2024 3:21 PM    [facility]

## 2024-02-07 NOTE — Patient Instructions (Signed)
 Medication Instructions:  Your physician recommends that you continue on your current medications as directed. Please refer to the Current Medication list given to you today.   *If you need a refill on your cardiac medications before your next appointment, please call your pharmacy*  Lab Work: No labs ordered today  If you have labs (blood work) drawn today and your tests are completely normal, you will receive your results only by: MyChart Message (if you have MyChart) OR A paper copy in the mail If you have any lab test that is abnormal or we need to change your treatment, we will call you to review the results.  Testing/Procedures: No test ordered today   Follow-Up: At [facility], you and your health needs are our priority.  As part of our continuing mission to provide you with exceptional heart care, our providers are all part of one team.  This team includes your primary Cardiologist (physician) and Advanced Practice Providers or APPs (Physician Assistants and Nurse Practitioners) who all work together to provide you with the care you need, when you need it.  Your next appointment:   6 month(s)  Provider:   You may see Dr. Darliss or one of the following Advanced Practice Providers on your designated Care Team:   Lonni Meager, NP Lesley Maffucci, PA-C Bernardino Bring, PA-C Cadence Olyphant, PA-C Tylene Lunch, NP Barnie Hila, NP    We recommend signing up for the patient portal called MyChart.  Sign up information is provided on this After Visit Summary.  MyChart is used to connect with patients for Virtual Visits (Telemedicine).  Patients are able to view lab/test results, encounter notes, upcoming appointments, etc.  Non-urgent messages can be sent to your provider as well.   To learn more about what you can do with MyChart, go to [email or web address].

## 2024-02-08 DIAGNOSIS — J301 Allergic rhinitis due to pollen: Secondary | ICD-10-CM | POA: Diagnosis not present

## 2024-02-22 DIAGNOSIS — J301 Allergic rhinitis due to pollen: Secondary | ICD-10-CM | POA: Diagnosis not present

## 2024-02-29 DIAGNOSIS — J301 Allergic rhinitis due to pollen: Secondary | ICD-10-CM | POA: Diagnosis not present

## 2024-02-29 NOTE — Progress Notes (Signed)
 Jonathan Roberson.                                          MRN: [id number]   02/29/2024   The VBCI Quality Team Specialist reviewed this patient medical record for the purposes of chart review for care gap closure. The following were reviewed: abstraction for care gap closure-controlling blood pressure.    VBCI Quality Team

## 2024-03-06 ENCOUNTER — Telehealth: Payer: Self-pay | Admitting: Radiation Oncology

## 2024-03-06 NOTE — Telephone Encounter (Signed)
 Pt called and left a vm to cancel his appt on 10/8.

## 2024-03-07 ENCOUNTER — Encounter: Payer: Self-pay | Admitting: Nurse Practitioner

## 2024-03-07 DIAGNOSIS — C61 Malignant neoplasm of prostate: Secondary | ICD-10-CM

## 2024-03-07 DIAGNOSIS — J301 Allergic rhinitis due to pollen: Secondary | ICD-10-CM | POA: Diagnosis not present

## 2024-03-07 DIAGNOSIS — E7800 Pure hypercholesterolemia, unspecified: Secondary | ICD-10-CM

## 2024-03-08 ENCOUNTER — Ambulatory Visit: Admitting: Radiation Oncology

## 2024-03-21 DIAGNOSIS — J301 Allergic rhinitis due to pollen: Secondary | ICD-10-CM | POA: Diagnosis not present

## 2024-03-22 DIAGNOSIS — H5203 Hypermetropia, bilateral: Secondary | ICD-10-CM | POA: Diagnosis not present

## 2024-03-22 DIAGNOSIS — H43813 Vitreous degeneration, bilateral: Secondary | ICD-10-CM | POA: Diagnosis not present

## 2024-03-22 DIAGNOSIS — H35372 Puckering of macula, left eye: Secondary | ICD-10-CM | POA: Diagnosis not present

## 2024-03-28 DIAGNOSIS — J301 Allergic rhinitis due to pollen: Secondary | ICD-10-CM | POA: Diagnosis not present

## 2024-03-29 DIAGNOSIS — J301 Allergic rhinitis due to pollen: Secondary | ICD-10-CM | POA: Diagnosis not present

## 2024-04-04 DIAGNOSIS — J301 Allergic rhinitis due to pollen: Secondary | ICD-10-CM | POA: Diagnosis not present

## 2024-04-07 ENCOUNTER — Other Ambulatory Visit: Payer: Self-pay

## 2024-04-07 ENCOUNTER — Emergency Department
Admission: EM | Admit: 2024-04-07 | Discharge: 2024-04-07 | Disposition: A | Discharge status: Home / Self Care | Attending: Emergency Medicine | Admitting: Emergency Medicine

## 2024-04-07 DIAGNOSIS — S80811A Abrasion, right lower leg, initial encounter: Secondary | ICD-10-CM | POA: Diagnosis not present

## 2024-04-07 DIAGNOSIS — S0121XA Laceration without foreign body of nose, initial encounter: Secondary | ICD-10-CM | POA: Insufficient documentation

## 2024-04-07 DIAGNOSIS — W228XXA Striking against or struck by other objects, initial encounter: Secondary | ICD-10-CM | POA: Insufficient documentation

## 2024-04-07 DIAGNOSIS — I10 Essential (primary) hypertension: Secondary | ICD-10-CM | POA: Insufficient documentation

## 2024-04-07 DIAGNOSIS — S0992XA Unspecified injury of nose, initial encounter: Secondary | ICD-10-CM | POA: Diagnosis present

## 2024-04-07 DIAGNOSIS — S0081XA Abrasion of other part of head, initial encounter: Secondary | ICD-10-CM | POA: Diagnosis not present

## 2024-04-07 DIAGNOSIS — E039 Hypothyroidism, unspecified: Secondary | ICD-10-CM | POA: Insufficient documentation

## 2024-04-07 DIAGNOSIS — J45909 Unspecified asthma, uncomplicated: Secondary | ICD-10-CM | POA: Insufficient documentation

## 2024-04-07 DIAGNOSIS — Z23 Encounter for immunization: Secondary | ICD-10-CM | POA: Diagnosis not present

## 2024-04-07 MED ORDER — LIDOCAINE-EPINEPHRINE-TETRACAINE (LET) TOPICAL GEL
3.0000 mL | Freq: Once | TOPICAL | Status: AC
  Administered 2024-04-07: 3 mL via TOPICAL
  Filled 2024-04-07: qty 3

## 2024-04-07 MED ORDER — CEPHALEXIN 500 MG PO CAPS: 500.0000 mg | ORAL_CAPSULE | Freq: Three times a day (TID) | ORAL | 0 refills | Status: AC

## 2024-04-07 MED ORDER — TETANUS-DIPHTH-ACELL PERTUSSIS 5-2-15.5 LF-MCG/0.5 IM SUSP
0.5000 mL | Freq: Once | INTRAMUSCULAR | Status: AC
  Administered 2024-04-07: 0.5 mL via INTRAMUSCULAR
  Filled 2024-04-07: qty 0.5

## 2024-04-07 NOTE — ED Notes (Signed)
 Pt given Dc instructions. Pt verbalized understanding of medication and follow up care. Pt ambulatory from ED without difficulty.

## 2024-04-07 NOTE — Discharge Instructions (Addendum)
 Keep the abrasions clean, dry, and covered it has been there.  Avoid any lotions, creams, oils, or ointments to the wound glue.  Take antibiotic as prescribed.  Follow-up with your primary provider return to the ED for any signs of wound infection.

## 2024-04-07 NOTE — ED Provider Notes (Signed)
 [facility] Emergency Department Provider Note     Event Date/Time   First MD Initiated Contact with Patient 04/07/24 1303     (approximate)   History   Laceration   HPI  Jonathan Stieg. is a 68 y.o. male with a history of HTN, HLD, hypothyroid, asthma, and allergies, presents to the ED for evaluation of a facial contusion.  Patient reports a skin tear to the right nose from a tree branch that he had while cutting trees.  He also notes an abrasion to the right lower leg.  He denies any LOC, nosebleed, or visual injury.  No other injury reported at this time.  Patient initially reported to local urgent care for evaluation, and now presents here for further evaluation.  Physical Exam   Triage Vital Signs: ED Triage Vitals  Encounter Vitals Group     BP 04/07/24 1231 (!) 151/97     Girls Systolic BP Percentile --      Girls Diastolic BP Percentile --      Boys Systolic BP Percentile --      Boys Diastolic BP Percentile --      Pulse Rate 04/07/24 1231 65     Resp 04/07/24 1229 18     Temp 04/07/24 1229 97.9 F (36.6 C)     Temp src --      SpO2 04/07/24 1231 98 %     Weight 04/07/24 1230 165 lb (74.8 kg)     Height 04/07/24 1230 6' 1 (1.854 m)     Head Circumference --      Peak Flow --      Pain Score 04/07/24 1230 3     Pain Loc --      Pain Education --      Exclude from Growth Chart --     Most recent vital signs: Vitals:   04/07/24 1231 04/07/24 1527  BP: (!) 151/97 (!) 144/88  Pulse: 65 68  Resp:  18  Temp:  98 F (36.7 C)  SpO2: 98% 96%    General Awake, no distress. NAD HEENT NCAT. PERRL. EOMI. No rhinorrhea. Mucous membranes are moist.  Patient with a laceration to the right nasal ala, without extension and to the nasal vestibule.  The laceration is distracted anteriorly, and there is a moderate amount of debris to the wound into the face.  Abrasions noted to the nose, malar cheek, and chin on the right.  No dental injury  noted.  No oral lesions appreciated.  No epistaxis noted. CV:  Good peripheral perfusion.  RESP:  Normal effort.  MSK:  AROM of all extremities.  Linear abrasion to the right anterior shin NEURO: Cranial nerves II to XII grossly intact.   ED Results / Procedures / Treatments   Labs (all labs ordered are listed, but only abnormal results are displayed) Labs Reviewed - No data to display   EKG   RADIOLOGY  No results found.   PROCEDURES:  Critical Care performed: No  .Laceration Repair  Date/Time: 04/07/2024 1:48 PM  Performed by: Loyd Candida LULLA Aldona, PA-C Authorized by: Loyd Candida LULLA Aldona, PA-C   Consent:    Consent obtained:  Verbal   Consent given by:  Patient   Risks, benefits, and alternatives were discussed: yes     Risks discussed:  Pain, infection, poor wound healing and poor cosmetic result Universal protocol:    Site/side marked: yes     Patient identity confirmed:  Verbally with patient Anesthesia:    Anesthesia method:  Topical application   Topical anesthetic:  LET Laceration details:    Location:  Face   Face location:  Nose   Length (cm):  1.3   Depth (mm):  5 Pre-procedure details:    Preparation:  Patient was prepped and draped in usual sterile fashion Exploration:    Limited defect created (wound extended): no     Hemostasis achieved with:  Direct pressure and LET   Contaminated: yes   Treatment:    Area cleansed with:  Saline   Amount of cleaning:  Extensive   Irrigation solution:  Sterile saline   Irrigation method:  Tap   Visualized foreign bodies/material removed: yes     Debridement:  None   Undermining:  None   Scar revision: no   Skin repair:    Repair method:  Tissue adhesive Approximation:    Approximation:  Close Repair type:    Repair type:  Simple Post-procedure details:    Dressing:  Open (no dressing)   Procedure completion:  Tolerated well, no immediate complications   MEDICATIONS ORDERED IN  ED: Medications  Tdap (ADACEL) injection 0.5 mL (0.5 mLs Intramuscular Given 04/07/24 1359)  lidocaine -EPINEPHrine -tetracaine (LET) topical gel (3 mLs Topical Given 04/07/24 1401)    IMPRESSION / MDM / ASSESSMENT AND PLAN / ED COURSE  I reviewed the triage vital signs and the nursing notes.                              Differential diagnosis includes, but is not limited to, abrasion, laceration, torsion, hematoma, nosebleeding, nose fracture  Patient's presentation is most consistent with acute, uncomplicated illness.  Patient's diagnosis is consistent with nasal laceration.  Patient presents to the ED for evaluation of injury after a tree branch to cause a laceration to his right nasal alla.  He presents with abrasions to the face but no reports of any frank nose deformity, nosebleed, or dental injury.  No LOC is reported.  Patient is active and alert.  He has patient will be discharged home with prescriptions for Keflex, for wound care prophylaxis. Patient is to follow up with his primary provider as needed or otherwise directed. Patient is given ED precautions to return to the ED for any worsening or new symptoms.    FINAL CLINICAL IMPRESSION(S) / ED DIAGNOSES   Final diagnoses:  Facial abrasion, initial encounter  Nasal laceration, initial encounter     Rx / DC Orders   ED Discharge Orders          Ordered    cephALEXin (KEFLEX) 500 MG capsule  3 times daily        04/07/24 1541             Note:  This document was prepared using Dragon voice recognition software and may include unintentional dictation errors.    Loyd Candida LULLA Aldona, PA-C 04/07/24 TERRY    Dicky Anes, MD 04/07/24 [phone]

## 2024-04-07 NOTE — ED Provider Notes (Signed)
 68 year old male presents with wife for laceration of face after cutting down a tree today.  I quickly evaluated patient.  He has a laceration all the way through the right nostril.  Complicated laceration which might need plastic surgery consult.  Otherwise, stable, D alert and oriented.  Eferred to ED.  Patient's wife will take him to [facility].   Arvis Huxley B, PA-C 04/07/24 1210

## 2024-04-07 NOTE — ED Triage Notes (Signed)
 Pt to ED for laceration/skin tear to right nose from tree branch while cutting trees. Skin abrasion to right lower leg. Bleeding controlled. Denies LOC Sent from [facility]

## 2024-04-11 DIAGNOSIS — J301 Allergic rhinitis due to pollen: Secondary | ICD-10-CM | POA: Diagnosis not present

## 2024-04-18 DIAGNOSIS — J301 Allergic rhinitis due to pollen: Secondary | ICD-10-CM | POA: Diagnosis not present

## 2024-04-25 DIAGNOSIS — J301 Allergic rhinitis due to pollen: Secondary | ICD-10-CM | POA: Diagnosis not present

## 2024-05-02 DIAGNOSIS — J301 Allergic rhinitis due to pollen: Secondary | ICD-10-CM | POA: Diagnosis not present

## 2024-05-09 DIAGNOSIS — J301 Allergic rhinitis due to pollen: Secondary | ICD-10-CM | POA: Diagnosis not present

## 2024-05-12 ENCOUNTER — Encounter: Payer: Self-pay | Admitting: Nurse Practitioner

## 2024-05-13 ENCOUNTER — Other Ambulatory Visit: Payer: Self-pay | Admitting: "Endocrinology

## 2024-05-16 ENCOUNTER — Other Ambulatory Visit: Payer: Self-pay | Admitting: Nurse Practitioner

## 2024-05-16 DIAGNOSIS — J301 Allergic rhinitis due to pollen: Secondary | ICD-10-CM | POA: Diagnosis not present

## 2024-05-19 NOTE — Telephone Encounter (Signed)
 Requested Prescriptions  Pending Prescriptions Disp Refills   fluticasone -salmeterol (ADVAIR ) 100-50 MCG/ACT AEPB [Pharmacy Med Name: FLUTICASONE -SALMETEROL 100-50 MCG/A] 60 each 1    Sig: INHALE 1 PUFF BY MOUTH TWICE DAILY. RINSE MOUTH AFTER USE.     Pulmonology:  Combination Products Passed - 05/19/2024 10:26 AM      Passed - Valid encounter within last 12 months    Recent Outpatient Visits           3 months ago Recurrent major depressive disorder, in full remission   [place] [facility], Melanie T, NP   9 months ago Essential hypertension, benign   [place] [facility] [place], Melanie DASEN, NP

## 2024-05-23 DIAGNOSIS — J301 Allergic rhinitis due to pollen: Secondary | ICD-10-CM | POA: Diagnosis not present

## 2024-05-29 ENCOUNTER — Other Ambulatory Visit: Payer: Self-pay | Admitting: Nurse Practitioner

## 2024-05-29 DIAGNOSIS — C61 Malignant neoplasm of prostate: Secondary | ICD-10-CM

## 2024-05-29 DIAGNOSIS — I10 Essential (primary) hypertension: Secondary | ICD-10-CM

## 2024-05-29 DIAGNOSIS — E7800 Pure hypercholesterolemia, unspecified: Secondary | ICD-10-CM

## 2024-05-31 ENCOUNTER — Other Ambulatory Visit

## 2024-05-31 DIAGNOSIS — E0590 Thyrotoxicosis, unspecified without thyrotoxic crisis or storm: Secondary | ICD-10-CM

## 2024-05-31 DIAGNOSIS — I10 Essential (primary) hypertension: Secondary | ICD-10-CM

## 2024-05-31 DIAGNOSIS — E7800 Pure hypercholesterolemia, unspecified: Secondary | ICD-10-CM

## 2024-05-31 DIAGNOSIS — C61 Malignant neoplasm of prostate: Secondary | ICD-10-CM

## 2024-06-01 ENCOUNTER — Ambulatory Visit: Payer: Self-pay | Admitting: Nurse Practitioner

## 2024-06-01 LAB — COMPREHENSIVE METABOLIC PANEL WITH GFR
ALT: 19 IU/L (ref 0–44)
AST: 22 IU/L (ref 0–40)
Albumin: 4.4 g/dL (ref 3.9–4.9)
Alkaline Phosphatase: 88 IU/L (ref 47–123)
BUN/Creatinine Ratio: 11 (ref 10–24)
BUN: 8 mg/dL (ref 8–27)
Bilirubin Total: 1.2 mg/dL (ref 0.0–1.2)
CO2: 27 mmol/L (ref 20–29)
Calcium: 9 mg/dL (ref 8.6–10.2)
Chloride: 95 mmol/L — ABNORMAL LOW (ref 96–106)
Creatinine, Ser: 0.71 mg/dL — ABNORMAL LOW (ref 0.76–1.27)
Globulin, Total: 2.9 g/dL (ref 1.5–4.5)
Glucose: 84 mg/dL (ref 70–99)
Potassium: 3.8 mmol/L (ref 3.5–5.2)
Sodium: 134 mmol/L (ref 134–144)
Total Protein: 7.3 g/dL (ref 6.0–8.5)
eGFR: 100 mL/min/1.73

## 2024-06-01 LAB — LIPID PANEL W/O CHOL/HDL RATIO
Cholesterol, Total: 136 mg/dL (ref 100–199)
HDL: 80 mg/dL
LDL Chol Calc (NIH): 47 mg/dL (ref 0–99)
Triglycerides: 36 mg/dL (ref 0–149)
VLDL Cholesterol Cal: 9 mg/dL (ref 5–40)

## 2024-06-01 LAB — PSA: Prostate Specific Ag, Serum: 2 ng/mL (ref 0.0–4.0)

## 2024-06-01 NOTE — Progress Notes (Signed)
 Contacted via MyChart  Good morning Jonathan Roberson, your labs are starting to return and presently are stable. Will alert you when remainder return. Any questions? Keep being amazing!!  Thank you for allowing me to participate in your care.  I appreciate you. Kindest regards, Stacie Templin

## 2024-06-06 ENCOUNTER — Other Ambulatory Visit: Payer: Self-pay | Admitting: Nurse Practitioner

## 2024-06-06 ENCOUNTER — Encounter: Payer: Self-pay | Admitting: Nurse Practitioner

## 2024-06-06 DIAGNOSIS — E0590 Thyrotoxicosis, unspecified without thyrotoxic crisis or storm: Secondary | ICD-10-CM

## 2024-06-07 ENCOUNTER — Other Ambulatory Visit: Payer: Self-pay | Admitting: Nurse Practitioner

## 2024-06-07 DIAGNOSIS — R7689 Other specified abnormal immunological findings in serum: Secondary | ICD-10-CM

## 2024-06-12 ENCOUNTER — Other Ambulatory Visit

## 2024-06-12 DIAGNOSIS — E0590 Thyrotoxicosis, unspecified without thyrotoxic crisis or storm: Secondary | ICD-10-CM

## 2024-06-12 DIAGNOSIS — R7689 Other specified abnormal immunological findings in serum: Secondary | ICD-10-CM

## 2024-06-13 ENCOUNTER — Ambulatory Visit: Payer: Self-pay | Admitting: Nurse Practitioner

## 2024-06-13 DIAGNOSIS — R7989 Other specified abnormal findings of blood chemistry: Secondary | ICD-10-CM

## 2024-06-13 LAB — CBC WITH DIFFERENTIAL/PLATELET
Basophils Absolute: 0 x10E3/uL (ref 0.0–0.2)
Basos: 1 %
EOS (ABSOLUTE): 0.1 x10E3/uL (ref 0.0–0.4)
Eos: 5 %
Hematocrit: 41.7 % (ref 37.5–51.0)
Hemoglobin: 13.9 g/dL (ref 13.0–17.7)
Immature Grans (Abs): 0 x10E3/uL (ref 0.0–0.1)
Immature Granulocytes: 0 %
Lymphocytes Absolute: 0.6 x10E3/uL — ABNORMAL LOW (ref 0.7–3.1)
Lymphs: 27 %
MCH: 31.5 pg (ref 26.6–33.0)
MCHC: 33.3 g/dL (ref 31.5–35.7)
MCV: 95 fL (ref 79–97)
Monocytes Absolute: 0.3 x10E3/uL (ref 0.1–0.9)
Monocytes: 13 %
Neutrophils Absolute: 1.3 x10E3/uL — ABNORMAL LOW (ref 1.4–7.0)
Neutrophils: 54 %
Platelets: 163 x10E3/uL (ref 150–450)
RBC: 4.41 x10E6/uL (ref 4.14–5.80)
RDW: 12.2 % (ref 11.6–15.4)
WBC: 2.3 x10E3/uL — CL (ref 3.4–10.8)

## 2024-06-13 LAB — T4, FREE: Free T4: 1.53 ng/dL (ref 0.82–1.77)

## 2024-06-13 LAB — TSH: TSH: 1.12 u[IU]/mL (ref 0.450–4.500)

## 2024-06-13 LAB — T3: T3, Total: 122 ng/dL (ref 71–180)

## 2024-06-13 NOTE — Progress Notes (Signed)
 Good morning, I have Jonathan Roberson's thyroid  labs which remain stable. I am rechecking his CBC in a few weeks as intermittently he has had low WBC before but this is a bit lower than past low levels. Have a great day!!

## 2024-06-13 NOTE — Progress Notes (Signed)
 Contacted via MyChart - need lab only visit in 4 weeks please  Good morning Octaviano, I have your labs back. Thyroid  labs look great, but CBC is showing low white blood cell count and neutrophils + lymphocytes (types of white blood cells). Last time they were lower was 07/01/23. I would like to recheck this outpatient in 4 weeks and if remains low then we may need to get you into hematology. I will send thyroid  labs to endocrine. Any questions? Keep being stellar!!  Thank you for allowing me to participate in your care.  I appreciate you. Kindest regards, Genieve Ramaswamy

## 2024-07-01 NOTE — Patient Instructions (Incomplete)
 Be Involved in Caring For Your Health:  Taking Medications When medications are taken as directed, they can greatly improve your health. But if they are not taken as prescribed, they may not work. In some cases, not taking them correctly can be harmful. To help ensure your treatment remains effective and safe, understand your medications and how to take them. Bring your medications to each visit for review by your provider.  Your lab results, notes, and after visit summary will be available on My Chart. We strongly encourage you to use this feature. If lab results are abnormal the clinic will contact you with the appropriate steps. If the clinic does not contact you assume the results are satisfactory. You can always view your results on My Chart. If you have questions regarding your health or results, please contact the clinic during office hours. You can also ask questions on My Chart.  We at [facility] are grateful that you chose us  to provide your care. We strive to provide evidence-based and compassionate care and are always looking for feedback. If you get a survey from the clinic please complete this so we can hear your opinions.   Healthy Eating for Your Heart Many factors influence your heart health, including eating and exercise habits. Heart health is also called coronary health. Coronary risk increases with abnormal blood fat (lipid) levels. A heart-healthy eating plan includes limiting unhealthy fats, increasing healthy fats, limiting salt (sodium) intake, and making other diet and lifestyle changes. What is my plan? Your health care provider may recommend that: You limit your fat intake to _________% or less of your total calories each day. You limit your saturated fat intake to _________% or less of your total calories each day. You limit the amount of cholesterol in your diet to less than _________ mg per day. You limit the amount of sodium in your diet to less than  _________ mg per day. What are tips for following this plan? Cooking Cook foods using methods other than frying. Baking, boiling, grilling, and broiling are all good options. Other ways to reduce fat include: Removing the skin from poultry. Removing all visible fats from meats. Steaming vegetables in water  or broth. Meal planning  At meals, imagine dividing your plate into fourths: Fill one-half of your plate with vegetables and green salads. Fill one-fourth of your plate with whole grains. Fill one-fourth of your plate with lean protein foods. Eat 2-4 cups of vegetables per day. One cup of vegetables equals 1 cup (91 g) broccoli or cauliflower florets, 2 medium carrots, 1 large bell pepper, 1 large sweet potato, 1 large tomato, 1 medium white potato, 2 cups (150 g) raw leafy greens. Eat 1-2 cups of fruit per day. One cup of fruit equals 1 small apple, 1 large banana, 1 cup (237 g) mixed fruit, 1 large orange,  cup (82 g) dried fruit, 1 cup (240 mL) 100% fruit juice. Eat more foods that contain soluble fiber. Examples include apples, broccoli, carrots, beans, peas, and barley. Aim to get 25-30 g of fiber per day. Increase your consumption of legumes, nuts, and seeds to 4-5 servings per week. One serving of dried beans or legumes equals  cup (90 g) cooked, 1 serving of nuts is  oz (12 almonds, 24 pistachios, or 7 walnut halves), and 1 serving of seeds equals  oz (8 g). Fats Choose healthy fats more often. Choose monounsaturated and polyunsaturated fats, such as olive and canola oils, avocado oil, flaxseeds, walnuts, almonds,  and seeds. Eat more omega-3 fats. Choose salmon, mackerel, sardines, tuna, flaxseed oil, and ground flaxseeds. Aim to eat fish at least 2 times each week. Check food labels carefully to identify foods with trans fats or high amounts of saturated fat. Limit saturated fats. These are found in animal products, such as meats, butter, and cream. Plant sources of saturated  fats include palm oil, palm kernel oil, and coconut oil. Avoid foods with partially hydrogenated oils in them. These contain trans fats. Examples are stick margarine, some tub margarines, cookies, crackers, and other baked goods. Avoid fried foods. General information Eat more home-cooked food and less restaurant, buffet, and fast food. Limit or avoid alcohol. Limit foods that are high in added sugar and simple starches such as foods made using white refined flour (white breads, pastries, sweets). Lose weight if you are overweight. Losing just 5-10% of your body weight can help your overall health and prevent diseases such as diabetes and heart disease. Monitor your sodium intake, especially if you have high blood pressure. Talk with your health care provider about your sodium intake. Try to incorporate more vegetarian meals weekly. What foods should I eat? Fruits All fresh, canned (in natural juice), or frozen fruits. Vegetables Fresh or frozen vegetables (raw, steamed, roasted, or grilled). Green salads. Grains Most grains. Choose whole wheat and whole grains most of the time. Rice and pasta, including brown rice and pastas made with whole wheat. Meats and other proteins Lean, well-trimmed beef, veal, pork, and lamb. Chicken and turkey without skin. All fish and shellfish. Wild duck, rabbit, pheasant, and venison. Egg whites or low-cholesterol egg substitutes. Dried beans, peas, lentils, and tofu. Seeds and most nuts. Dairy Low-fat or nonfat cheeses, including ricotta and mozzarella. Skim or 1% milk (liquid, powdered, or evaporated). Buttermilk made with low-fat milk. Nonfat or low-fat yogurt. Fats and oils Non-hydrogenated (trans-free) margarines. Vegetable oils, including soybean, sesame, sunflower, olive, avocado, peanut, safflower, corn, canola, and cottonseed. Salad dressings or mayonnaise made with a vegetable oil. Beverages Water  (mineral or sparkling). Coffee and tea. Unsweetened  ice tea. Diet beverages. Sweets and desserts Sherbet, gelatin, and fruit ice. Small amounts of dark chocolate. Limit all sweets and desserts. Seasonings and condiments All seasonings and condiments. The items listed above may not be a complete list of foods and beverages you can eat. Contact a dietitian for more options. What foods should I avoid? Fruits Canned fruit in heavy syrup. Fruit in cream or butter sauce. Fried fruit. Limit coconut. Vegetables Vegetables cooked in cheese, cream, or butter sauce. Fried vegetables. Grains Breads made with saturated or trans fats, oils, or whole milk. Croissants. Sweet rolls. Donuts. High-fat crackers, such as cheese crackers and chips. Meats and other proteins Fatty meats, such as hot dogs, ribs, sausage, bacon, rib-eye roast or steak. High-fat deli meats, such as salami and bologna. Caviar. Domestic duck and goose. Organ meats, such as liver. Dairy Cream, sour cream, cream cheese, and creamed cottage cheese. Whole-milk cheeses. Whole or 2% milk (liquid, evaporated, or condensed). Whole buttermilk. Cream sauce or high-fat cheese sauce. Whole-milk yogurt. Fats and oils Meat fat, or shortening. Cocoa butter, hydrogenated oils, palm oil, coconut oil, palm kernel oil. Solid fats and shortenings, including bacon fat, salt pork, lard, and butter. Nondairy cream substitutes. Salad dressings with cheese or sour cream. Beverages Regular sodas and any drinks with added sugar. Sweets and desserts Frosting. Pudding. Cookies. Cakes. Pies. Milk chocolate or white chocolate. Buttered syrups. Full-fat ice cream or ice cream drinks. The items  listed above may not be a complete list of foods and beverages to avoid. Contact a dietitian for more information. Summary Heart-healthy meal planning includes limiting unhealthy fats, increasing healthy fats, limiting salt (sodium) intake and making other diet and lifestyle changes. Lose weight if you are overweight. Losing  just 5-10% of your body weight can help your overall health and prevent diseases such as diabetes and heart disease. Focus on eating a balance of foods, including fruits and vegetables, low-fat or nonfat dairy, lean protein, nuts and legumes, whole grains, and heart-healthy oils and fats. This information is not intended to replace advice given to you by your health care provider. Make sure you discuss any questions you have with your health care provider. Document Revised: 03/27/2024 Document Reviewed: 06/23/2021 Elsevier Patient Education  2025 [employer].

## 2024-07-04 ENCOUNTER — Encounter: Admitting: Nurse Practitioner

## 2024-07-04 DIAGNOSIS — C61 Malignant neoplasm of prostate: Secondary | ICD-10-CM

## 2024-07-04 DIAGNOSIS — E871 Hypo-osmolality and hyponatremia: Secondary | ICD-10-CM

## 2024-07-04 DIAGNOSIS — E559 Vitamin D deficiency, unspecified: Secondary | ICD-10-CM

## 2024-07-04 DIAGNOSIS — I10 Essential (primary) hypertension: Secondary | ICD-10-CM

## 2024-07-04 DIAGNOSIS — E7800 Pure hypercholesterolemia, unspecified: Secondary | ICD-10-CM

## 2024-07-04 DIAGNOSIS — Z0000 Encounter for general adult medical examination without abnormal findings: Secondary | ICD-10-CM

## 2024-07-04 DIAGNOSIS — J4540 Moderate persistent asthma, uncomplicated: Secondary | ICD-10-CM

## 2024-07-04 DIAGNOSIS — E0590 Thyrotoxicosis, unspecified without thyrotoxic crisis or storm: Secondary | ICD-10-CM

## 2024-07-04 DIAGNOSIS — F3342 Major depressive disorder, recurrent, in full remission: Secondary | ICD-10-CM

## 2024-07-06 ENCOUNTER — Ambulatory Visit: Admitting: Nurse Practitioner

## 2024-07-06 ENCOUNTER — Encounter: Payer: Self-pay | Admitting: Nurse Practitioner

## 2024-07-06 VITALS — BP 108/66 | HR 76 | Temp 97.5°F | Ht 73.0 in | Wt 170.6 lb

## 2024-07-06 DIAGNOSIS — R7989 Other specified abnormal findings of blood chemistry: Secondary | ICD-10-CM | POA: Diagnosis not present

## 2024-07-06 DIAGNOSIS — Z8546 Personal history of malignant neoplasm of prostate: Secondary | ICD-10-CM

## 2024-07-06 DIAGNOSIS — E0590 Thyrotoxicosis, unspecified without thyrotoxic crisis or storm: Secondary | ICD-10-CM

## 2024-07-06 DIAGNOSIS — I10 Essential (primary) hypertension: Secondary | ICD-10-CM

## 2024-07-06 DIAGNOSIS — E7800 Pure hypercholesterolemia, unspecified: Secondary | ICD-10-CM | POA: Diagnosis not present

## 2024-07-06 DIAGNOSIS — E871 Hypo-osmolality and hyponatremia: Secondary | ICD-10-CM | POA: Diagnosis not present

## 2024-07-06 DIAGNOSIS — J4540 Moderate persistent asthma, uncomplicated: Secondary | ICD-10-CM | POA: Diagnosis not present

## 2024-07-06 DIAGNOSIS — F3342 Major depressive disorder, recurrent, in full remission: Secondary | ICD-10-CM | POA: Diagnosis not present

## 2024-07-06 MED ORDER — ROSUVASTATIN CALCIUM 10 MG PO TABS: 10.0000 mg | ORAL_TABLET | ORAL | 3 refills | Status: AC

## 2024-07-06 NOTE — Assessment & Plan Note (Signed)
 Diagnosed 04/14/22, followed by urology. Recent notes reviewed from urology and oncology.  Has been very diet focused.  PSA remains stable on recent check, December 2025 = 2.0.

## 2024-07-06 NOTE — Progress Notes (Signed)
 "  BP 108/66   Pulse 76   Temp (!) 97.5 F (36.4 C) (Oral)   Ht 6' 1 (1.854 m)   Wt 170 lb 9.6 oz (77.4 kg)   SpO2 95%   BMI 22.51 kg/m    Subjective:    Patient ID: Jonathan Roberson., male    DOB: [birth year], 69 y.o.   MRN: [id number]  HPI: Jonathan Roberson. is a 69 y.o. male  Chief Complaint  Patient presents with   Results   HYPERTENSION without Chronic Kidney Disease Taking Lisinopril  and Amlodipine . Tried Atorvastatin  for a period, but had fatigue and muscle aches with this. Hypertension status: stable  Satisfied with current treatment? yes Duration of hypertension: chronic BP monitoring frequency:  rarely BP range:  BP medication side effects:  no Medication compliance: good compliance Aspirin : no Recurrent headaches: no Visual changes: no Palpitations: no Dyspnea: no Chest pain: no Lower extremity edema: no Dizzy/lightheaded: no  The 10-year ASCVD risk score (Arnett DK, et al., 2019) is: 8.8%   Values used to calculate the score:     Age: 65 years     Clinically relevant sex: Male     Is Non-Hispanic African American: No     Diabetic: No     Tobacco smoker: No     Systolic Blood Pressure: 108 mmHg     Is BP treated: Yes     HDL Cholesterol: 80 mg/dL     Total Cholesterol: 136 mg/dL  ASTHMA Uses Advair  in evening and Albuterol  as needed. Asthma status: stable Satisfied with current treatment?: yes Albuterol /rescue inhaler frequency: none Dyspnea frequency: none Wheezing frequency: none Cough frequency: none Nocturnal symptom frequency: none Limitation of activity: no Current upper respiratory symptoms: no Aerochamber/spacer use: no Visits to ER or Urgent Care in past year: no Pneumovax: Up to Date Influenza: Refused   HYPERTHYROIDISM Taking Methimazole  5 MG, taking whole tablet. Recent labs noted low WBC, in past this has been present on and off since February 2024. Labs on recent with downward trend. He does endorse being more fatigued and  continues to have intermittent LUQ discomfort. No weight loss, reduction in appetite, or fever. Sister has similar issue.  Has history of prostate cancer and last saw urology on 05/30/23 and oncology 08/25/23. Recent PSA remains stable. Thyroid  control status:stable Satisfied with current treatment? yes Medication side effects: no Medication compliance: good compliance Etiology of hypothyroidism:  Recent dose adjustment:no Fatigue: yes Cold intolerance: no Heat intolerance: no Weight gain: no Weight loss: no Constipation: no Diarrhea/loose stools: no Palpitations: no Lower extremity edema: no Anxiety/depressed mood: no   ANXIETY Continues on Paxil  daily with benefit. Mood status: stable Satisfied with current treatment?: yes Symptom severity: mild  Duration of current treatment : chronic Side effects: no Medication compliance: good compliance Psychotherapy/counseling: none Depressed mood: no Anxious mood: no Anhedonia: no Significant weight loss or gain: no Insomnia: none Fatigue: no Feelings of worthlessness or guilt: no Impaired concentration/indecisiveness: no Suicidal ideations: no Hopelessness: no Crying spells: no    07/06/2024    9:07 AM 02/02/2024    9:54 AM 10/19/2023   11:31 AM 08/23/2023   10:47 AM 07/01/2023   10:27 AM  Depression screen PHQ 2/9  Decreased Interest 0 2 0 1 1  Down, Depressed, Hopeless 0 2 0 0 1  PHQ - 2 Score 0 4 0 1 2  Altered sleeping 0 2 0 1 1  Tired, decreased energy 0 2 0 1 1  Change in  appetite 0 0 0 1 0  Feeling bad or failure about yourself  0 0 0 0 0  Trouble concentrating 0 0 0 0 0  Moving slowly or fidgety/restless 0 1 0 0 0  Suicidal thoughts 0 0 0 0 0  PHQ-9 Score 0 9  0  4  4   Difficult doing work/chores Not difficult at all  Not difficult at all Not difficult at all Not difficult at all     Data saved with a previous flowsheet row definition       07/06/2024    9:09 AM 02/02/2024    9:54 AM 08/23/2023   10:47 AM  07/01/2023   10:27 AM  GAD 7 : Generalized Anxiety Score  Nervous, Anxious, on Edge 0 2  1  1    Control/stop worrying 0 0  1  0   Worry too much - different things 0 0  1  0   Trouble relaxing 0 2  0  1   Restless 0 0  0  1   Easily annoyed or irritable 0 2  0  0   Afraid - awful might happen 0 0  0  0   Total GAD 7 Score 0 6 3 3   Anxiety Difficulty Not difficult at all  Not difficult at all Not difficult at all     Data saved with a previous flowsheet row definition      Relevant past medical, surgical, family and social history reviewed and updated as indicated. Interim medical history since our last visit reviewed. Allergies and medications reviewed and updated.  Review of Systems  Constitutional:  Positive for fatigue. Negative for activity change, diaphoresis and fever.  Respiratory:  Negative for cough, chest tightness, shortness of breath and wheezing.   Cardiovascular:  Negative for chest pain, palpitations and leg swelling.  Gastrointestinal: Negative.   Endocrine: Negative for cold intolerance and heat intolerance.  Neurological: Negative.   Psychiatric/Behavioral: Negative.      Per HPI unless specifically indicated above     Objective:    BP 108/66   Pulse 76   Temp (!) 97.5 F (36.4 C) (Oral)   Ht 6' 1 (1.854 m)   Wt 170 lb 9.6 oz (77.4 kg)   SpO2 95%   BMI 22.51 kg/m   Wt Readings from Last 3 Encounters:  07/06/24 170 lb 9.6 oz (77.4 kg)  04/07/24 165 lb (74.8 kg)  02/07/24 169 lb (76.7 kg)    Physical Exam Vitals and nursing note reviewed.  Constitutional:      General: He is awake. He is not in acute distress.    Appearance: He is well-developed and well-groomed. He is not ill-appearing or toxic-appearing.  HENT:     Head: Normocephalic.     Right Ear: Hearing and external ear normal.     Left Ear: Hearing and external ear normal.  Eyes:     General: Lids are normal.     Extraocular Movements: Extraocular movements intact.      Conjunctiva/sclera: Conjunctivae normal.  Neck:     Thyroid : No thyromegaly.     Vascular: No carotid bruit.  Cardiovascular:     Rate and Rhythm: Normal rate and regular rhythm.     Heart sounds: Normal heart sounds. No murmur heard.    No gallop.  Pulmonary:     Effort: No accessory muscle usage or respiratory distress.     Breath sounds: Normal breath sounds. No decreased breath sounds, wheezing or  rales.  Abdominal:     General: Bowel sounds are normal. There is no distension.     Palpations: Abdomen is soft.     Tenderness: There is no abdominal tenderness.  Musculoskeletal:     Cervical back: Full passive range of motion without pain.     Right lower leg: No edema.     Left lower leg: No edema.  Lymphadenopathy:     Head:     Right side of head: Submandibular (mild) adenopathy present. No submental, tonsillar, preauricular or posterior auricular adenopathy.     Left side of head: Submandibular (mild) adenopathy present. No submental, tonsillar, preauricular or posterior auricular adenopathy.     Cervical: No cervical adenopathy.  Skin:    General: Skin is warm.     Capillary Refill: Capillary refill takes less than 2 seconds.  Neurological:     Mental Status: He is alert and oriented to person, place, and time.     Deep Tendon Reflexes: Reflexes are normal and symmetric.     Reflex Scores:      Brachioradialis reflexes are 2+ on the right side and 2+ on the left side.      Patellar reflexes are 2+ on the right side and 2+ on the left side. Psychiatric:        Attention and Perception: Attention normal.        Mood and Affect: Mood normal.        Speech: Speech normal.        Behavior: Behavior normal. Behavior is cooperative.        Thought Content: Thought content normal.    Results for orders placed or performed in visit on 06/12/24  CBC with Differential/Platelet   Collection Time: 06/12/24  9:30 AM  Result Value Ref Range   WBC 2.3 (LL) 3.4 - 10.8 x10E3/uL    RBC 4.41 4.14 - 5.80 x10E6/uL   Hemoglobin 13.9 13.0 - 17.7 g/dL   Hematocrit 58.2 62.4 - 51.0 %   MCV 95 79 - 97 fL   MCH 31.5 26.6 - 33.0 pg   MCHC 33.3 31.5 - 35.7 g/dL   RDW 87.7 88.3 - 84.5 %   Platelets 163 150 - 450 x10E3/uL   Neutrophils 54 Not Estab. %   Lymphs 27 Not Estab. %   Monocytes 13 Not Estab. %   Eos 5 Not Estab. %   Basos 1 Not Estab. %   Neutrophils Absolute 1.3 (L) 1.4 - 7.0 x10E3/uL   Lymphocytes Absolute 0.6 (L) 0.7 - 3.1 x10E3/uL   Monocytes Absolute 0.3 0.1 - 0.9 x10E3/uL   EOS (ABSOLUTE) 0.1 0.0 - 0.4 x10E3/uL   Basophils Absolute 0.0 0.0 - 0.2 x10E3/uL   Immature Granulocytes 0 Not Estab. %   Immature Grans (Abs) 0.0 0.0 - 0.1 x10E3/uL   Hematology Comments: Note:   T3   Collection Time: 06/12/24  9:30 AM  Result Value Ref Range   T3, Total 122 71 - 180 ng/dL  TSH   Collection Time: 06/12/24  9:30 AM  Result Value Ref Range   TSH 1.120 0.450 - 4.500 uIU/mL  T4, free   Collection Time: 06/12/24  9:30 AM  Result Value Ref Range   Free T4 1.53 0.82 - 1.77 ng/dL      Assessment & Plan:   Problem List Items Addressed This Visit       Cardiovascular and Mediastinum   Essential hypertension, benign - Primary   Chronic, ongoing.  BP at goal  in office today.  Continue current medication regimen and adjust as needed, could consider reduction in future.  Would avoid BB due to active lifestyle and lower HR at baseline. Recommend he monitor BP at least a few mornings a week at home and document.  DASH diet at home.  Continue current medication regimen and adjust as needed.  Labs today: up to date.        Relevant Medications   rosuvastatin  (CRESTOR ) 10 MG tablet (Start on 07/07/2024)     Respiratory   Moderate persistent asthma without complication   Chronic, stable with minimal use of Albuterol .  Continue to monitor and adjust as needed.  Refills as needed.        Endocrine   Hyperthyroidism   Diagnosed 05/08/22.  Followed by endocrinology.   Continue current medication regimen as ordered by them.  Recent notes reviewed.  Thyroid  labs up to date.        Other   Major depression in full remission   Chronic, stable.  Continue current medication regimen and adjust as needed. Denies SI/HI.  Utilizes exercise for mood, which is beneficial.      Hyponatremia   Stable since August 2023, with no lows. Will discontinue this problem at present.      Hyperlipidemia   Chronic, ongoing. Continue to collaborate with cardiology as needed. Did not tolerate Atorvastatin . Discussed with him a trial of Rosuvastatin  once or three times a week. He is agreeable to trying this. Educated him on medication. He will start out with once a week dosing and see if tolerates.      Relevant Medications   rosuvastatin  (CRESTOR ) 10 MG tablet (Start on 07/07/2024)   History of prostate cancer   Diagnosed 04/14/22, followed by urology. Recent notes reviewed from urology and oncology.  Has been very diet focused.  PSA remains stable on recent check, December 2025 = 2.0.      Abnormal CBC measurement   On and off since February 2024, with recent WBC trending down to 2.3. ?if related to medications. However, he is having more fatigue and ongoing LUQ discomfort intermittently. Will get into hematology for further work-up and recommendations. No other red flag symptoms present.      Relevant Orders   Ambulatory referral to Hematology / Oncology     Follow up plan: Return in about 6 months (around 01/03/2025) for HTN/HLD, HYPERTHYROID, ASTHMA.      "

## 2024-07-06 NOTE — Assessment & Plan Note (Signed)
 Chronic, ongoing.  BP at goal in office today.  Continue current medication regimen and adjust as needed, could consider reduction in future.  Would avoid BB due to active lifestyle and lower HR at baseline. Recommend he monitor BP at least a few mornings a week at home and document.  DASH diet at home.  Continue current medication regimen and adjust as needed.  Labs today: up to date.

## 2024-07-06 NOTE — Assessment & Plan Note (Signed)
 Diagnosed 05/08/22.  Followed by endocrinology.  Continue current medication regimen as ordered by them.  Recent notes reviewed.  Thyroid labs up to date.

## 2024-07-06 NOTE — Assessment & Plan Note (Signed)
 Chronic, ongoing. Continue to collaborate with cardiology as needed. Did not tolerate Atorvastatin . Discussed with him a trial of Rosuvastatin  once or three times a week. He is agreeable to trying this. Educated him on medication. He will start out with once a week dosing and see if tolerates.

## 2024-07-06 NOTE — Assessment & Plan Note (Signed)
 Stable since August 2023, with no lows. Will discontinue this problem at present.

## 2024-07-06 NOTE — Assessment & Plan Note (Signed)
 Chronic, stable.  Continue current medication regimen and adjust as needed. Denies SI/HI.  Utilizes exercise for mood, which is beneficial.

## 2024-07-06 NOTE — Patient Instructions (Signed)
 Healthy Eating for Adults Healthy eating may help you get and keep a healthy body weight, reduce the risk of chronic disease, and live a long and productive life. It is important to follow a healthy eating pattern. Your nutritional and calorie needs should be met mainly by different nutrient-rich foods. What are tips for following this plan? Reading food labels Read labels and choose the following: Reduced or low sodium products. Juices with 100% fruit juice. Foods with low saturated fats (<3 g per serving) and high polyunsaturated and monounsaturated fats. Foods with whole grains, such as whole wheat, cracked wheat, brown rice, and wild rice. Whole grains that are fortified with folic acid. This is recommended for females who are pregnant or who want to become pregnant. Read labels and do not eat or drink the following: Foods or drinks with added sugars. These include foods that contain brown sugar, corn sweetener, corn syrup, dextrose, fructose, glucose, high-fructose corn syrup, honey, invert sugar, lactose, malt syrup, maltose, molasses, raw sugar, sucrose, trehalose, or turbinado sugar. Limit your intake of added sugars to less than 10% of your total daily calories. Do not eat more than the following amounts of added sugar per day: 6 teaspoons (25 g) for females. 9 teaspoons (38 g) for males. Foods that contain processed or refined starches and grains. Refined grain products, such as white flour, degermed cornmeal, white bread, and white rice. Shopping Choose nutrient-rich snacks, such as vegetables, whole fruits, and nuts. Avoid high-calorie and high-sugar snacks, such as potato chips, fruit snacks, and candy. Use oil-based dressings and spreads on foods instead of solid fats such as butter, margarine, sour cream, or cream cheese. Limit pre-made sauces, mixes, and instant products such as flavored rice, instant noodles, and ready-made pasta. Try more plant-protein sources, such as tofu,  tempeh, black beans, edamame, lentils, nuts, and seeds. Explore eating plans such as the Mediterranean diet or vegetarian diet. Try heart-healthy dips made with beans and healthy fats like hummus and guacamole. Vegetables go great with these. Cooking Use oil to saut or stir-fry foods instead of solid fats such as butter, margarine, or lard. Try baking, boiling, grilling, or broiling instead of frying. Remove the fatty part of meats before cooking. Steam vegetables in water or broth. Meal planning  At meals, imagine dividing your plate into fourths: One-half of your plate is fruits and vegetables. One-fourth of your plate is whole grains. One-fourth of your plate is protein, especially lean meats, poultry, eggs, tofu, beans, or nuts. Include low-fat dairy as part of your daily diet. Lifestyle Choose healthy options in all settings, including home, work, school, restaurants, or stores. Prepare your food safely: Wash your hands after handling raw meats. Where you prepare food, keep surfaces clean by regularly washing with hot, soapy water. Keep raw meats separate from ready-to-eat foods, such as fruits and vegetables. Cook seafood, meat, poultry, and eggs to the recommended temperature. Get a food thermometer. Store foods at safe temperatures. In general: Keep cold foods at 28F (4.4C) or below. Keep hot foods at 128F (60C) or above. Keep your freezer at [facility] (-17.8C) or below. Foods are not safe to eat if they have been between the temperatures of 40-128F (4.4-60C) for more than 2 hours. What foods should I eat? Fruits Aim to eat 1-2 cups of fresh, canned (in natural juice), or frozen fruits each day. One cup of fruit equals 1 small apple, 1 large banana, 8 large strawberries, 1 cup (237 g) canned fruit,  cup (82 g) dried  fruit, or 1 cup (240 mL) 100% juice. Vegetables Aim to eat 2-4 cups of fresh and frozen vegetables each day, including different varieties and colors. One cup  of vegetables equals 1 cup (91 g) broccoli or cauliflower florets, 2 medium carrots, 2 cups (150 g) raw, leafy greens, 1 large tomato, 1 large bell pepper, 1 large sweet potato, or 1 medium white potato. Grains Aim to eat 5-10 ounce-equivalents of whole grains each day. Examples of 1 ounce-equivalent of grains include 1 slice of bread, 1 cup (40 g) ready-to-eat cereal, 3 cups (24 g) popcorn, or  cup (93 g) cooked rice. Meats and other proteins Try to eat 5-7 ounce-equivalents of protein each day. Examples of 1 ounce-equivalent of protein include 1 egg,  oz nuts (12 almonds, 24 pistachios, or 7 walnut halves), 1/4 cup (90 g) cooked beans, 6 tablespoons (90 g) hummus or 1 tablespoon (16 g) peanut butter. A cut of meat or fish that is the size of a deck of cards is about 3-4 ounce-equivalents (85 g). Of the protein you eat each week, try to have at least 8 sounce (227 g) of seafood. This is about 2 servings per week. This includes salmon, trout, herring, sardines, and anchovies. Dairy Aim to eat 3 cup-equivalents of fat-free or low-fat dairy each day. Examples of 1 cup-equivalent of dairy include 1 cup (240 mL) milk, 8 ounces (250 g) yogurt, 1 ounces (44 g) natural cheese, or 1 cup (240 mL) fortified soy milk. Fats and oils Aim for about 5 teaspoons (21 g) of fats and oils per day. Choose monounsaturated fats, such as canola and olive oils, mayonnaise made with olive oil or avocado oil, avocados, peanut butter, and most nuts, or polyunsaturated fats, such as sunflower, corn, and soybean oils, walnuts, pine nuts, sesame seeds, sunflower seeds, and flaxseed. Beverages Aim for 6 eight-ounce glasses of water per day. Limit coffee to 3-5 eight-ounce cups per day. Limit caffeinated beverages that have added calories, such as soda and energy drinks. If you drink alcohol: Limit how much you have to: 0-1 drink a day if you are male. 0-2 drinks a day if you are male. Know how much alcohol is in your drink.  In the [place] one drink is one 12 oz bottle of beer (355 mL), one 5 oz glass of wine (148 mL), or one 1 oz glass of hard liquor (44 mL). Seasoning and other foods Try not to add too much salt to your food. Try using herbs and spices instead of salt. Try not to add sugar to food. This information is based on [place] nutrition guidelines. To learn more, visit disposablenylon.be. Exact amounts may vary. You may need different amounts. This information is not intended to replace advice given to you by your health care provider. Make sure you discuss any questions you have with your health care provider. Document Revised: 03/27/2024 Document Reviewed: 02/16/2022 Elsevier Patient Education  2025 [employer].

## 2024-07-06 NOTE — Assessment & Plan Note (Signed)
 Chronic, stable with minimal use of Albuterol.  Continue to monitor and adjust as needed.  Refills as needed.

## 2024-07-06 NOTE — Assessment & Plan Note (Addendum)
 On and off since February 2024, with recent WBC trending down to 2.3. ?if related to medications. However, he is having more fatigue and ongoing LUQ discomfort intermittently. Will get into hematology for further work-up and recommendations. No other red flag symptoms present. Mild lymphadenopathy to submandibular L>R.

## 2024-07-21 ENCOUNTER — Ambulatory Visit: Admitting: "Endocrinology

## 2024-09-06 ENCOUNTER — Encounter: Admitting: Nurse Practitioner

## 2024-10-24 ENCOUNTER — Ambulatory Visit

## 2025-01-03 ENCOUNTER — Ambulatory Visit: Admitting: Nurse Practitioner
